# Patient Record
Sex: Male | Born: 1950
Health system: Southern US, Community
[De-identification: ages and names within clinical notes are randomized; demographics above are authoritative.]

## PROBLEM LIST (undated history)

## (undated) DIAGNOSIS — F419 Anxiety disorder, unspecified: Secondary | ICD-10-CM

## (undated) DIAGNOSIS — E119 Type 2 diabetes mellitus without complications: Secondary | ICD-10-CM

## (undated) DIAGNOSIS — R109 Unspecified abdominal pain: Secondary | ICD-10-CM

## (undated) DIAGNOSIS — K219 Gastro-esophageal reflux disease without esophagitis: Secondary | ICD-10-CM

## (undated) DIAGNOSIS — K635 Polyp of colon: Secondary | ICD-10-CM

## (undated) DIAGNOSIS — J189 Pneumonia, unspecified organism: Secondary | ICD-10-CM

## (undated) DIAGNOSIS — E785 Hyperlipidemia, unspecified: Secondary | ICD-10-CM

## (undated) DIAGNOSIS — G8929 Other chronic pain: Secondary | ICD-10-CM

## (undated) DIAGNOSIS — N200 Calculus of kidney: Secondary | ICD-10-CM

## (undated) DIAGNOSIS — F32A Depression, unspecified: Secondary | ICD-10-CM

## (undated) DIAGNOSIS — C73 Malignant neoplasm of thyroid gland: Secondary | ICD-10-CM

## (undated) HISTORY — PX: ELBOW ARTHROSCOPY WITH TENDON RECONSTRUCTION: SHX5616

## (undated) HISTORY — PX: BACK SURGERY: SHX140

## (undated) HISTORY — DX: Type 2 diabetes mellitus without complications: E11.9

## (undated) HISTORY — PX: CHOLECYSTECTOMY: SHX55

## (undated) HISTORY — DX: Malignant neoplasm of thyroid gland: C73

## (undated) HISTORY — DX: Depression, unspecified: F32.A

## (undated) HISTORY — PX: THYROIDECTOMY: SHX17

## (undated) HISTORY — DX: Calculus of kidney: N20.0

## (undated) HISTORY — DX: Polyp of colon: K63.5

## (undated) HISTORY — PX: ANKLE ARTHROSCOPY WITH RECONSTRUCTION: SHX5583

## (undated) HISTORY — DX: Gastro-esophageal reflux disease without esophagitis: K21.9

## (undated) HISTORY — DX: Hyperlipidemia, unspecified: E78.5

## (undated) HISTORY — PX: ULNAR NERVE TRANSPOSITION: SHX2595

## (undated) HISTORY — PX: COLONOSCOPY: SHX174

## (undated) HISTORY — PX: SHOULDER ARTHROSCOPY WITH LABRAL REPAIR: SHX5691

## (undated) HISTORY — DX: Anxiety disorder, unspecified: F41.9

---

## 1998-08-12 ENCOUNTER — Ambulatory Visit (HOSPITAL_BASED_OUTPATIENT_CLINIC_OR_DEPARTMENT_OTHER): Admission: RE | Admit: 1998-08-12 | Discharge: 1998-08-12 | Payer: Self-pay | Admitting: General Surgery

## 2001-02-16 ENCOUNTER — Other Ambulatory Visit: Admission: RE | Admit: 2001-02-16 | Discharge: 2001-02-16 | Payer: Self-pay | Admitting: Gastroenterology

## 2001-02-16 ENCOUNTER — Encounter (INDEPENDENT_AMBULATORY_CARE_PROVIDER_SITE_OTHER): Payer: Self-pay | Admitting: Specialist

## 2001-03-06 ENCOUNTER — Encounter: Payer: Self-pay | Admitting: Family Medicine

## 2001-03-06 ENCOUNTER — Ambulatory Visit (HOSPITAL_COMMUNITY): Admission: RE | Admit: 2001-03-06 | Discharge: 2001-03-06 | Payer: Self-pay | Admitting: Family Medicine

## 2001-06-29 ENCOUNTER — Ambulatory Visit (HOSPITAL_COMMUNITY): Admission: RE | Admit: 2001-06-29 | Discharge: 2001-06-29 | Payer: Self-pay | Admitting: Family Medicine

## 2001-06-29 ENCOUNTER — Encounter: Payer: Self-pay | Admitting: Family Medicine

## 2002-06-13 ENCOUNTER — Encounter: Payer: Self-pay | Admitting: Family Medicine

## 2002-06-13 ENCOUNTER — Ambulatory Visit (HOSPITAL_COMMUNITY): Admission: RE | Admit: 2002-06-13 | Discharge: 2002-06-13 | Payer: Self-pay | Admitting: Family Medicine

## 2002-07-02 ENCOUNTER — Ambulatory Visit (HOSPITAL_COMMUNITY): Admission: RE | Admit: 2002-07-02 | Discharge: 2002-07-02 | Payer: Self-pay | Admitting: Family Medicine

## 2002-07-02 ENCOUNTER — Encounter: Payer: Self-pay | Admitting: Family Medicine

## 2003-01-09 ENCOUNTER — Encounter: Payer: Self-pay | Admitting: Family Medicine

## 2003-01-09 ENCOUNTER — Ambulatory Visit (HOSPITAL_COMMUNITY): Admission: RE | Admit: 2003-01-09 | Discharge: 2003-01-09 | Payer: Self-pay | Admitting: Family Medicine

## 2003-04-03 ENCOUNTER — Ambulatory Visit (HOSPITAL_COMMUNITY): Admission: RE | Admit: 2003-04-03 | Discharge: 2003-04-03 | Payer: Self-pay | Admitting: Gastroenterology

## 2004-01-30 ENCOUNTER — Ambulatory Visit (HOSPITAL_COMMUNITY): Admission: RE | Admit: 2004-01-30 | Discharge: 2004-01-30 | Payer: Self-pay | Admitting: Family Medicine

## 2004-05-14 ENCOUNTER — Ambulatory Visit: Payer: Self-pay | Admitting: Gastroenterology

## 2004-05-28 ENCOUNTER — Ambulatory Visit: Payer: Self-pay | Admitting: Gastroenterology

## 2004-06-23 ENCOUNTER — Ambulatory Visit (HOSPITAL_COMMUNITY): Admission: RE | Admit: 2004-06-23 | Discharge: 2004-06-23 | Payer: Self-pay | Admitting: Family Medicine

## 2004-12-28 ENCOUNTER — Ambulatory Visit (HOSPITAL_COMMUNITY): Admission: RE | Admit: 2004-12-28 | Discharge: 2004-12-28 | Payer: Self-pay | Admitting: Family Medicine

## 2005-03-10 ENCOUNTER — Encounter: Admission: RE | Admit: 2005-03-10 | Discharge: 2005-03-10 | Payer: Self-pay | Admitting: Orthopaedic Surgery

## 2005-08-18 ENCOUNTER — Ambulatory Visit (HOSPITAL_COMMUNITY): Admission: RE | Admit: 2005-08-18 | Discharge: 2005-08-18 | Payer: Self-pay | Admitting: Family Medicine

## 2006-06-10 ENCOUNTER — Ambulatory Visit: Payer: Self-pay | Admitting: Gastroenterology

## 2006-06-10 LAB — CONVERTED CEMR LAB
Bacteria, UA: NEGATIVE
Basophils Absolute: 0.1 10*3/uL (ref 0.0–0.1)
Basophils Relative: 0.6 % (ref 0.0–1.0)
Bilirubin Urine: NEGATIVE
Crystals: NEGATIVE
Eosinophils Absolute: 0.1 10*3/uL (ref 0.0–0.6)
Eosinophils Relative: 1 % (ref 0.0–5.0)
HCT: 45 % (ref 39.0–52.0)
Hemoglobin, Urine: NEGATIVE
Hemoglobin: 16 g/dL (ref 13.0–17.0)
Ketones, ur: NEGATIVE mg/dL
Leukocytes, UA: NEGATIVE
Lymphocytes Relative: 27.2 % (ref 12.0–46.0)
MCHC: 35.6 g/dL (ref 30.0–36.0)
MCV: 94.3 fL (ref 78.0–100.0)
Monocytes Absolute: 0.6 10*3/uL (ref 0.2–0.7)
Monocytes Relative: 6.4 % (ref 3.0–11.0)
Neutro Abs: 5.8 10*3/uL (ref 1.4–7.7)
Neutrophils Relative %: 64.8 % (ref 43.0–77.0)
Nitrite: NEGATIVE
Platelets: 372 10*3/uL (ref 150–400)
RBC: 4.78 M/uL (ref 4.22–5.81)
RDW: 11.8 % (ref 11.5–14.6)
Specific Gravity, Urine: 1.02 (ref 1.000–1.03)
Squamous Epithelial / HPF: NEGATIVE /lpf
Urine Glucose: NEGATIVE mg/dL
Urobilinogen, UA: 0.2 (ref 0.0–1.0)
WBC: 9.1 10*3/uL (ref 4.5–10.5)
pH: 7 (ref 5.0–8.0)

## 2006-10-14 ENCOUNTER — Ambulatory Visit (HOSPITAL_BASED_OUTPATIENT_CLINIC_OR_DEPARTMENT_OTHER): Admission: RE | Admit: 2006-10-14 | Discharge: 2006-10-14 | Payer: Self-pay | Admitting: Urology

## 2006-10-14 ENCOUNTER — Encounter (INDEPENDENT_AMBULATORY_CARE_PROVIDER_SITE_OTHER): Payer: Self-pay | Admitting: Urology

## 2007-05-30 ENCOUNTER — Emergency Department (HOSPITAL_COMMUNITY): Admission: EM | Admit: 2007-05-30 | Discharge: 2007-05-30 | Payer: Self-pay | Admitting: Emergency Medicine

## 2007-09-29 ENCOUNTER — Ambulatory Visit (HOSPITAL_COMMUNITY): Admission: RE | Admit: 2007-09-29 | Discharge: 2007-09-29 | Payer: Self-pay | Admitting: Family Medicine

## 2008-01-15 ENCOUNTER — Ambulatory Visit (HOSPITAL_COMMUNITY): Admission: RE | Admit: 2008-01-15 | Discharge: 2008-01-15 | Payer: Self-pay | Admitting: Family Medicine

## 2009-05-13 ENCOUNTER — Encounter (INDEPENDENT_AMBULATORY_CARE_PROVIDER_SITE_OTHER): Payer: Self-pay | Admitting: *Deleted

## 2009-12-16 ENCOUNTER — Telehealth: Payer: Self-pay | Admitting: Gastroenterology

## 2010-03-21 ENCOUNTER — Encounter: Payer: Self-pay | Admitting: Ophthalmology

## 2010-03-21 ENCOUNTER — Observation Stay (HOSPITAL_COMMUNITY)
Admission: EM | Admit: 2010-03-21 | Discharge: 2010-03-22 | Payer: Self-pay | Source: Home / Self Care | Admitting: Emergency Medicine

## 2010-03-22 ENCOUNTER — Encounter: Payer: Self-pay | Admitting: Internal Medicine

## 2010-03-22 DIAGNOSIS — K219 Gastro-esophageal reflux disease without esophagitis: Secondary | ICD-10-CM | POA: Insufficient documentation

## 2010-03-22 DIAGNOSIS — Z85850 Personal history of malignant neoplasm of thyroid: Secondary | ICD-10-CM | POA: Insufficient documentation

## 2010-03-22 DIAGNOSIS — R55 Syncope and collapse: Secondary | ICD-10-CM | POA: Insufficient documentation

## 2010-03-22 DIAGNOSIS — M5380 Other specified dorsopathies, site unspecified: Secondary | ICD-10-CM | POA: Insufficient documentation

## 2010-03-22 DIAGNOSIS — Z8659 Personal history of other mental and behavioral disorders: Secondary | ICD-10-CM | POA: Insufficient documentation

## 2010-04-21 ENCOUNTER — Ambulatory Visit: Admit: 2010-04-21 | Payer: Self-pay

## 2010-04-29 ENCOUNTER — Emergency Department (HOSPITAL_COMMUNITY)
Admission: EM | Admit: 2010-04-29 | Discharge: 2010-04-29 | Payer: Self-pay | Source: Home / Self Care | Admitting: Emergency Medicine

## 2010-05-04 LAB — COMPREHENSIVE METABOLIC PANEL
ALT: 18 U/L (ref 0–53)
AST: 17 U/L (ref 0–37)
Albumin: 3.9 g/dL (ref 3.5–5.2)
Alkaline Phosphatase: 92 U/L (ref 39–117)
BUN: 10 mg/dL (ref 6–23)
CO2: 21 mEq/L (ref 19–32)
Calcium: 8.4 mg/dL (ref 8.4–10.5)
Chloride: 107 mEq/L (ref 96–112)
Creatinine, Ser: 0.89 mg/dL (ref 0.4–1.5)
GFR calc Af Amer: >60 mL/min (ref >60)
GFR calc non Af Amer: >60 mL/min (ref >60)
Glucose, Bld: 99 mg/dL (ref 70–99)
Potassium: 3.7 mEq/L (ref 3.5–5.1)
Sodium: 134 mEq/L — ABNORMAL LOW (ref 135–145)
Total Bilirubin: 0.7 mg/dL (ref 0.3–1.2)
Total Protein: 6.7 g/dL (ref 6.0–8.3)

## 2010-05-04 LAB — DIFFERENTIAL
Basophils Absolute: 0.1 10*3/uL (ref 0.0–0.1)
Basophils Relative: 1 % (ref 0–1)
Eosinophils Absolute: 0.1 10*3/uL (ref 0.0–0.7)
Eosinophils Relative: 1 % (ref 0–5)
Lymphocytes Relative: 23 % (ref 12–46)
Lymphs Abs: 2.5 10*3/uL (ref 0.7–4.0)
Monocytes Absolute: 0.5 10*3/uL (ref 0.1–1.0)
Monocytes Relative: 5 % (ref 3–12)
Neutro Abs: 7.6 10*3/uL (ref 1.7–7.7)
Neutrophils Relative %: 70 % (ref 43–77)

## 2010-05-04 LAB — LIPASE, BLOOD: Lipase: 44 U/L (ref 11–59)

## 2010-05-04 LAB — CBC
HCT: 43.8 % (ref 39.0–52.0)
Hemoglobin: 15.5 g/dL (ref 13.0–17.0)
MCH: 32.1 pg (ref 26.0–34.0)
MCHC: 35.4 g/dL (ref 30.0–36.0)
MCV: 90.7 fL (ref 78.0–100.0)
Platelets: 318 10*3/uL (ref 150–400)
RBC: 4.83 MIL/uL (ref 4.22–5.81)
RDW: 12.9 % (ref 11.5–15.5)
WBC: 10.8 10*3/uL — ABNORMAL HIGH (ref 4.0–10.5)

## 2010-05-04 LAB — POCT CARDIAC MARKERS
CKMB, poc: <1 ng/mL — ABNORMAL LOW (ref 1.0–8.0)
Myoglobin, poc: 71.9 ng/mL (ref 12–200)
Troponin i, poc: <0.05 ng/mL (ref 0.00–0.09)

## 2010-05-10 ENCOUNTER — Encounter: Payer: Self-pay | Admitting: Gastroenterology

## 2010-05-11 ENCOUNTER — Encounter: Payer: Self-pay | Admitting: Family Medicine

## 2010-05-19 NOTE — Progress Notes (Signed)
Summary: Schedule Colonoscopy   Phone Note Outgoing Call Call back at Home Phone [phone]   Call placed by: Harlow Mares CMA Duncan Dull),  December 16, 2009 8:57 AM Call placed to: Patient Summary of Call: patient due for a colonoscopy, I have Left a message on patients machine to call back.  Initial call taken by: Harlow Mares CMA Duncan Dull),  December 16, 2009 8:57 AM  Follow-up for Phone Call        Left a message on the patient machine to call back and schedule a previsit and procedure with our office. A letter will be mailed to the patient.   Follow-up by: Harlow Mares CMA Duncan Dull),  December 25, 2009 2:32 PM

## 2010-05-19 NOTE — Letter (Signed)
Summary: Colonoscopy Letter  [facility]  [address] [place], [place] [address]   Phone: [phone]  Fax: [phone]      May 13, 2009 MRN: [id number]   [facility] [address] [place], [place]  [address]   Dear Lee Mitchell,   According to your medical record, it is time for you to schedule a Colonoscopy. The American Cancer Society recommends this procedure as a method to detect early colon cancer. Patients with a family history of colon cancer, or a personal history of colon polyps or inflammatory bowel disease are at increased risk.  This letter has beeen generated based on the recommendations made at the time of your procedure. If you feel that in your particular situation this may no longer apply, please contact our office.  Please call our office at [phone] to schedule this appointment or to update your records at your earliest convenience.  Thank you for cooperating with [place] to provide you with the very best care possible.   Sincerely,  Rachael Fee, M.D.  [facility] [phone]

## 2010-05-19 NOTE — Miscellaneous (Signed)
Summary: Hospital Admission  INTERNAL MEDICINE ADMISSION HISTORY AND PHYSICAL  Attending: Dr. Phillips Odor First Contact: Dr. Odis Luster [phone] Second Contact: Dr. Gwenlyn Perking [phone] PCP: None  CC: Syncope HPI: Lee Mitchell is pleasant 60 year old man with pmh significant for thyroid cancer 2004-2005, Depression, and GERD, who presented to the ED on the morning of admission for dizziness and syncope.  Pt sates that around 9am that morning he was walking in downtown [place] when he suddenly felt very lightheaded and dizzy.  Pts symptoms were associated with nausea but he denied any concurrent CP, SOB or radiating pain.  Pt sates that he saw stars and sat down but his condition continued to worsen and the next thing he knew he woke up after having passed out.  This episode was unwitnessed and the pt is unaware of how long he was unconscious. Pt did not lose control of his bowels or bladder but does state that the back of his tongue is sore on the left side.  The patient states that he did have some blurred vision after the event. Pt denies any previous similar episodes in the past.  After the episode the patient experienced a confusional state that he states lasted several hours.  Of note, pt has recently had a cold with congestion and has been eating less than normal but does not feel that he has been drinking less water.  Pt had not eaten on the morning of this event.  In the ED, pt developed back spasms with pain in his right side radiating to the back, but pt states that his has been occuring intermittently several time a week for the last 3 months.    Lee Mitchell denies any recent fever, chills, abdominal pain, change in his BM's, blood in his stool, black stool, or cold/heat intolerance.   ALLERGIES: NKDA   PAST MEDICAL HISTORY: Hypothyroidism Chronic right epididymitis Prostatitis colon tubular adenomas last colonoscopy Feb 2006  MEDICATIONS:  Synthroid 250 micrograms daily with added 75 mg  three times weekly MWF. Mirtazapine 30mg  by mouth QHS Omeprazole 40mg  by mouth daily.    SOCIAL HISTORY: Single Two children and one grandchild Smokes tobacco <1ppd x 33 years. Drinks alcohol a few times weekly (2-3 beers) Denies elicit drug use.   FAMILY HISTORY Mother is healthy and 15 Father died at age 55 of an unknown cause.   ROS: Negative as per HPI.   VITALS: T: 97.4 P:86  BP: 108/75 R: 17 O2SAT: 95% ON: RA Orthostatic Vital signs in ED: Sitting: 118/75 Standing: 116/80  PHYSICAL EXAM: General:  alert, well-developed, and cooperative to examination.   Head:  normocephalic and atraumatic.   Eyes:  vision grossly intact, pupils equal, pupils round, pupils reactive to light, no injection and anicteric.   Mouth:  pharynx pink and moist, no erythema, and no exudates.   Neck:  supple, full ROM, no thyromegaly, no JVD, and no carotid bruits.   Lungs:  normal respiratory effort, no accessory muscle use, normal breath sounds, no crackles, and no wheezes.  Heart:  normal rate, regular rhythm, no murmur, no gallop, and no rub.   Abdomen:  soft, non-tender, normal bowel sounds, no distention, no guarding, no rebound tenderness, no hepatomegaly, and no splenomegaly.   Msk:  no joint swelling, no joint warmth, and no redness over joints.   Pulses:  2+ DP/PT pulses bilaterally Extremities:  No cyanosis, clubbing, edema  Neurologic:  alert & oriented X3, cranial nerves II-XII intact, strength normal in all extremities.  Skin:  turgor normal and no rashes.     LABS:  WBC                                      9.5               4.0-10.5         K/uL  RBC                                      4.58              4.22-5.81        MIL/uL  Hemoglobin (HGB)                         14.9              13.0-17.0        g/dL  Hematocrit (HCT)                         42.1              39.0-52.0        %  MCV                                      91.9              78.0-100.0       fL  MCH -                                     32.5              26.0-34.0        pg  MCHC                                     35.4              30.0-36.0        g/dL  RDW                                      12.1              11.5-15.5        %  Platelet Count (PLT)                     277               150-400          K/uL  Neutrophils, %                           73                43-77            %  Lymphocytes, %  20                12-46            %  Monocytes, %                             6                 3-12             %  Eosinophils, %                           1                 0-5              %  Basophils, %                             0                 0-1              %  Neutrophils, Absolute                    6.9               1.7-7.7          K/uL  Lymphocytes, Absolute                    1.9               0.7-4.0          K/uL  Monocytes, Absolute                      0.6               0.1-1.0          K/uL  Eosinophils, Absolute                    0.1               0.0-0.7          K/uL  Basophils, Absolute                      0.0               0.0-0.1          K/uL  CKMB, POC                                <1.0       l      1.0-8.0          ng/mL  Troponin I, POC                          <0.05             0.00-0.09        ng/mL  Myoglobin, POC                           79.3  12-200           ng/mL   Sodium (NA)                              135               135-145          mEq/L  Potassium (K)                            3.8               3.5-5.1          mEq/L  Chloride                                 99                96-112           mEq/L  CO2                                      21                19-32            mEq/L  Glucose                                  111        h      70-99            mg/dL  BUN                                      13                6-23             mg/dL  Creatinine                               0.94               0.4-1.5          mg/dL  GFR, Est Non African American            >60               >60              mL/min  GFR, Est African American                >60               >60              mL/min    Oversized comment, see footnote  1  Calcium                                  8.6  8.4-10.5         mg/dL  Color, Urine                             YELLOW            YELLOW  Appearance                               CLOUDY     a      CLEAR  Specific Gravity                         1.020             1.005-1.030  pH                                       7.0               5.0-8.0  Urine Glucose                            NEGATIVE          NEG              mg/dL  Bilirubin                                NEGATIVE          NEG  Ketones                                  15         a      NEG              mg/dL  Blood                                    NEGATIVE          NEG  Protein                                  NEGATIVE          NEG              mg/dL  Urobilinogen                             0.2               0.0-1.0          mg/dL  Nitrite                                  NEGATIVE          NEG  Leukocytes  NEGATIVE          NEG    MICROSCOPIC NOT DONE ON URINES WITH NEGATIVE PROTEIN, BLOOD, LEUKOCYTES,    NITRITE, OR GLUCOSE <1000 mg/dL.  IMAGING:  MRI brain without contrast IMPRESSION:    1.  No acute intracranial abnormality.   2.  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  CT brain without contrast  IMPRESSION:   Negative unenhanced CT of the brain.  Mild mucosal thickening in   the right ethmoid air cells and medial right maxillary sinus.  Chest X-ray  IMPRESSION:   No active lung disease.  Mild peribronchial thickening.  EKG NSR no st segment changes. Non specific intraventricular conduction delay.  ASSESSMENT AND PLAN:  This is a 60 year old male with a hx of thyroid CA, depression and GERD who presents for evaluation of an acute  syncopal event.   Syncope: The broad DDX would include orthostatic hypotension, arrhythmia, seizure, TIA, ACS, or stroke.  At this point, stroke and ACS are unlikely given normal MRI and normal POC cardiac enzymes.  Seizure is a possability, however, a prolactin level will not be helpful at this time.  Given his age and mildly abnormal EKG, arrhythmia is a possibility and though cardiac exam was WNL, a structural lesion could also result in syncope.  Given that the pt is on synthroid, over replacement could be a possible precipitating factor for arrhythmia.  Given that the pt admits to decreased by mouth intake over the last few weeks and no food intake on the morning of the event, dehydration leading to orthostatic hypotension is a very likely mechanism for this event especially given that pts SBP increased from the low 100's to around 120 with administration of 1L NS in the ED.   Plan:       - Admit to telemetry     - Check TSH     - Check AM EKG given no comparison     - Check AM labs (CBC, BMET)     -Check 2D echo.     -Check cardiac enzymes x 3.   Back Spasms:  Will treat with flexeril for now.  Likely musculoskeletal in orgin.   Gerd: Continue protonix 40mg  daily.   Thyroid CA: Stable, will check TSH.   DEPRESSION/ANXIETY: Continue home medications  VTE PROPH: lovenox  Attending Physician: I have seen and examined the patient. I reviewed the resident/fellow note and agree with the findings and plan of care as documented. My additions and revisions are included.   Name:  Date:  Clinical Lists Changes

## 2010-05-19 NOTE — Discharge Summary (Signed)
Summary: Hospital Discharge Update    Hospital Discharge Update:  Date of Admission: 03/21/2010 Date of Discharge: 03/22/2010  Brief Summary:  60yo M with hx of thyroid cancer s/p surgery, hypothyroidism, depression, and GERD who was admitted after an unwitnessed episode of syncope. Had some prodromal symptoms of dizziness and nausea prior to passing out; he was sitting down at the time and does not know how long he lost consciousness. He was admitted for syncope w/u. CT of head was negative; MRI of head demonstrated no acute findings but did show some remote lacunar infarcts of L basal ganglia. ECG and CXR were largely unremarkable. Cardiac enzymes negative. No events on telemetry during admission. D-dimer negative. Echocardiogram pending at discharge. TSH was suppressed at 0.015; although we expected the TSH to be suppressed secondary to the high dose synthroid he takes at home because of thyroid cancer history, it was felt that hyperthyroid state could have contributed to an arrthymia that caused his syncopal episode. Furthermore, he has not had a physician check his thyroid levels in some time and it seems that the degree of suppression is most likely beyond the goal of his previous physicians. The risks vs. benefits of lowering synthroid dose discussed with patient and ultimately it was decided to decrease synthroid to daily at least temporarily. When he is followed in the clinic in  ~3 weeks, thyroid hormone levels should be checked and synthroid dose should be evaluated. He was also started on an 81mg  ASA b/c of possible remote lacunar infarcts and ?past symptoms of TIA.   Lab or other results pending at discharge:  2D Echocardiogram  Other labs needed at follow-up: TSH, free T4  Other follow-up issues:  1) May need adjustment of Synthroid dose. Was previously placed on high dose b/c of thyroid cancer history; however, TSH was profoundly suppressed and it was felt that syncopal  episode could have been driven by hyperthyroid state/arrythmia. From our understanding of his cancer history, a TSH of 0.1 or so may be a more appropriate goal and may put him at lower risk for adverse events. However, you may want to get records from [facility], where his thyroid surgery was done in 2005, to clarify this issue.  2) He has had back pain/spasm for the past few months that was obviously causing discomfort in the hospital. Wrote for some Flexeril and Vicodin at discharge; however, management of this pain should be further addressed at follow-up.  Problem list changes:  Added new problem of GERD (ICD-530.81) Added new problem of SYNCOPE (ICD-780.2) Added new problem of THYROID CANCER, HX OF (ICD-V10.87) Added new problem of DEPRESSION, HX OF (ICD-V11.8) Added new problem of MUSCLE SPASM, BACK (ICD-724.8)  Medication list changes:  Added new medication of ASPIRIN 81 MG CHEW (ASPIRIN) Take 1 tablet by mouth once a day Added new medication of MIRTAZAPINE 30 MG TABS (MIRTAZAPINE) Take 1 tablet by mouth once a day at bedtime Added new medication of OMEPRAZOLE 40 MG CPDR (OMEPRAZOLE) Take 1 tablet by mouth once a day Added new medication of SYNTHROID 125 MCG TABS (LEVOTHYROXINE SODIUM) Take 1 tablet by mouth once a day Added new medication of IBUPROFEN 200 MG TABS (IBUPROFEN) Take 2-4 tablets every 8 hours as needed for pain Added new medication of FLEXERIL 10 MG TABS (CYCLOBENZAPRINE HCL) Take one tablet by mouth at bedtime as needed for muscle spasm - Signed Added new medication of HYDROCODONE-ACETAMINOPHEN 5-500 MG TABS (HYDROCODONE-ACETAMINOPHEN) Take 1 tablet every 6 hours as needed for pain -  Signed Rx of FLEXERIL 10 MG TABS (CYCLOBENZAPRINE HCL) Take one tablet by mouth at bedtime as needed for muscle spasm;  #30 x 0;  Signed;  Entered by: Whitney Post MD;  Authorized by: Whitney Post MD;  Method used: Print then Give to Patient Rx of HYDROCODONE-ACETAMINOPHEN 5-500 MG TABS  (HYDROCODONE-ACETAMINOPHEN) Take 1 tablet every 6 hours as needed for pain;  #30 x 0;  Signed;  Entered by: Whitney Post MD;  Authorized by: Whitney Post MD;  Method used: Print then Give to Patient  The medication, problem, and allergy lists have been updated.  Please see the dictated discharge summary for details.  Discharge medications:  ASPIRIN 81 MG CHEW (ASPIRIN) Take 1 tablet by mouth once a day MIRTAZAPINE 30 MG TABS (MIRTAZAPINE) Take 1 tablet by mouth once a day at bedtime OMEPRAZOLE 40 MG CPDR (OMEPRAZOLE) Take 1 tablet by mouth once a day SYNTHROID 125 MCG TABS (LEVOTHYROXINE SODIUM) Take 1 tablet by mouth once a day IBUPROFEN 200 MG TABS (IBUPROFEN) Take 2-4 tablets every 8 hours as needed for pain FLEXERIL 10 MG TABS (CYCLOBENZAPRINE HCL) Take one tablet by mouth at bedtime as needed for muscle spasm HYDROCODONE-ACETAMINOPHEN 5-500 MG TABS (HYDROCODONE-ACETAMINOPHEN) Take 1 tablet every 6 hours as needed for pain  Other patient instructions:  We will schedule a follow-up appointment for you in 2-3 weeks at the [facility] clinic. If you are not contacted with an appointment time, please call [phone].  Please decrease you Synthroid dose to 58mcg/day. We will re-check your thyroid hormone levels at your follow-up appointment.  If you develop palpitations, chest pain, shortness of breath, dizziness, lightheadedness, or other concerning symptoms, call our clinic [phone]) or go to the Emergency room.   Note: Hospital Discharge Medications & Other Instructions handout was printed, one copy for patient and a second copy to be placed in hospital chart.

## 2010-06-30 LAB — BASIC METABOLIC PANEL
BUN: 13 mg/dL (ref 6–23)
BUN: 15 mg/dL (ref 6–23)
CO2: 21 mEq/L (ref 19–32)
CO2: 24 mEq/L (ref 19–32)
Calcium: 8.6 mg/dL (ref 8.4–10.5)
Chloride: 105 mEq/L (ref 96–112)
Chloride: 99 mEq/L (ref 96–112)
Creatinine, Ser: 0.94 mg/dL (ref 0.4–1.5)
GFR calc Af Amer: >60 mL/min (ref >60)
Glucose, Bld: 111 mg/dL — ABNORMAL HIGH (ref 70–99)
Glucose, Bld: 118 mg/dL — ABNORMAL HIGH (ref 70–99)
Potassium: 3.7 mEq/L (ref 3.5–5.1)
Sodium: 134 mEq/L — ABNORMAL LOW (ref 135–145)

## 2010-06-30 LAB — CBC
HCT: 36.7 % — ABNORMAL LOW (ref 39.0–52.0)
HCT: 42.1 % (ref 39.0–52.0)
Hemoglobin: 12.6 g/dL — ABNORMAL LOW (ref 13.0–17.0)
Hemoglobin: 14.9 g/dL (ref 13.0–17.0)
MCH: 31.8 pg (ref 26.0–34.0)
MCH: 32.5 pg (ref 26.0–34.0)
MCHC: 34.3 g/dL (ref 30.0–36.0)
MCHC: 35.4 g/dL (ref 30.0–36.0)
MCV: 91.9 fL (ref 78.0–100.0)
MCV: 92.7 fL (ref 78.0–100.0)
Platelets: 277 10*3/uL (ref 150–400)
RBC: 4.58 MIL/uL (ref 4.22–5.81)
RDW: 12.1 % (ref 11.5–15.5)
WBC: 9.5 K/uL (ref 4.0–10.5)

## 2010-06-30 LAB — DIFFERENTIAL
Basophils Absolute: 0 K/uL (ref 0.0–0.1)
Basophils Relative: 0 % (ref 0–1)
Eosinophils Absolute: 0.1 10*3/uL (ref 0.0–0.7)
Eosinophils Relative: 1 % (ref 0–5)
Lymphocytes Relative: 20 % (ref 12–46)
Lymphs Abs: 1.9 10*3/uL (ref 0.7–4.0)
Monocytes Absolute: 0.6 K/uL (ref 0.1–1.0)
Monocytes Relative: 6 % (ref 3–12)
Neutro Abs: 6.9 K/uL (ref 1.7–7.7)
Neutrophils Relative %: 73 % (ref 43–77)

## 2010-06-30 LAB — CK TOTAL AND CKMB (NOT AT ARMC)
CK, MB: 1.2 ng/mL (ref 0.3–4.0)
Relative Index: INVALID (ref 0.0–2.5)

## 2010-06-30 LAB — URINALYSIS, ROUTINE W REFLEX MICROSCOPIC
Bilirubin Urine: NEGATIVE
Glucose, UA: NEGATIVE mg/dL
Hgb urine dipstick: NEGATIVE
Ketones, ur: 15 mg/dL — AB
Nitrite: NEGATIVE
Protein, ur: NEGATIVE mg/dL
Specific Gravity, Urine: 1.02 (ref 1.005–1.030)
Urobilinogen, UA: 0.2 mg/dL (ref 0.0–1.0)
pH: 7 (ref 5.0–8.0)

## 2010-06-30 LAB — BASIC METABOLIC PANEL WITH GFR
GFR calc non Af Amer: >60 mL/min (ref >60)
Potassium: 3.8 meq/L (ref 3.5–5.1)
Sodium: 135 meq/L (ref 135–145)

## 2010-06-30 LAB — TROPONIN I: Troponin I: 0.01 ng/mL (ref 0.00–0.06)

## 2010-06-30 LAB — POCT CARDIAC MARKERS

## 2010-06-30 LAB — TSH: TSH: 0.015 u[IU]/mL — ABNORMAL LOW (ref 0.350–4.500)

## 2010-09-01 NOTE — Op Note (Signed)
NAME:  Lee Mitchell, Lee Mitchell            ACCOUNT NO.:  [id number]   MEDICAL RECORD NO.:  [id number]          PATIENT TYPE:  AMB   LOCATION:  [facility]                         FACILITY:  [facility]   PHYSICIAN:  Sigmund I. Patsi Sears, M.D.DATE OF BIRTH:  1951-02-26   DATE OF PROCEDURE:  10/14/2006  DATE OF DISCHARGE:                               OPERATIVE REPORT   PREOPERATIVE DIAGNOSIS:  Chronic right epididymitis.   POSTOPERATIVE DIAGNOSIS:  Chronic right epididymitis.   OPERATIONS:  1. Right lower inguinal exploration.  2. Right epididymectomy.   SURGEON:  Sigmund I. Patsi Sears, M.D.   ANESTHESIA:  General LMA.   PREPARATION:  After appropriate preanesthesia, the patient is brought to  the operating room and placed on the operating table in the dorsal  supine position, where general LMA anesthesia was introduced.  He  remained in a supine position, where the penis and inguinal area were  shaven, prepped with Betadine solution, draped in the usual fashion.   The patient's history is as follows:  This 60 year old male has chronic  right testicular pain, which has caused a problem with lifestyle.  The  patient is no longer able to walk well because of severe pain.  He is  limited in his ability to work or play.  He appears to have a right  chronically swollen epididymis not responsive to antibiotic and very  tender to palpation.  He has had two injections of Marcaine and Kenalog,  which have only temporarily reduced his discomfort.  He is in danger of  becoming dependent on pain medications.  He is now for inguinal  exploration and possible orchiectomy or epididymectomy.   PROCEDURE:  A 4 cm right lower inguinal incision was made, subcutaneous  tissue dissected with the electrosurgical unit.  The testicle was  delivered into the groin and the tunica vaginalis incised.  The  epididymis appeared to be quite swollen, with large amount of granular  edema of the head of the epididymis.  The  previously-noted vas was  identified, post vasectomy.  The vas was dissected down to level of the  epididymis.  The epididymis was then dissected off the testicle with  care taken to avoid injury to the vascular supply of the testicle.  Following complete epididymectomy, the tissue was sent to the laboratory  for examination.  The testicle was then placed within the wound.  No  bleeding was noted.  The wound was closed in two layers with 3-0 Vicryl  suture and 4-0  Monocryl running suture.  A sterile dressing was applied and the patient  underwent injection of the cord for a cord block.  The wound was also  injected with 0.25% plain Marcaine.  The patient was awakened and taken  to the recovery room.  The B&O suppository was given.  He was given IV  Toradol.      Sigmund I. Patsi Sears, M.D.  Electronically Signed     SIT/MEDQ  D:  10/14/2006  T:  10/15/2006  Job:  [id number]

## 2010-09-04 NOTE — Assessment & Plan Note (Signed)
[facility]                                 ON-CALL NOTE   NAME:LINEBERRYCody, Albus                     MRN:          [id number]  DATE:06/18/2006                            DOB:          1950-05-15    Telephone number [phone].   Physician:  Dr. Rob Bunting and Dr. Victorino Dike.   Mr. Meunier calls today complaining of rectal and perineal pain.  He  states he saw Dr. Christella Hartigan about a week ago, in Dr. Victorino Dike absence,  and was diagnosed with prostatitis, and he has been treated with Cipro.  He has had persistent problems with low-grade fevers and pain.  He was  initially prescribed Tylox, but this has lead to nausea and other side  effects, and he would like to change pain medications.  I have called  him in a prescription for Vicodin 5/500 one p.o. q. 6 hours p.r.n. pain,  # 20 with no refills to [facility].  I have advised him to  discontinue Tylox.  I advised him to contact his urologist, Dr. Durene Fruits, or his coverage this weekend if his symptoms are not  adequately controlled.  I have also advised him to follow up with Dr.  Durene Fruits for further management of this problem, as it is  generally out of our area of treatment.  He understands this and will  proceed with our recommendations.     Venita Lick. Russella Dar, MD, [facility]  Electronically Signed    MTS/MedQ  DD: 06/18/2006  DT: 06/18/2006  Job #: [id number]   cc:   Rachael Fee, MD

## 2010-09-04 NOTE — Assessment & Plan Note (Signed)
[place]                         GASTROENTEROLOGY OFFICE NOTE   NAME:LINEBERRYHicks, Feick                   MRN:          [id number]  DATE:06/10/2006                            DOB:          03/17/1951    PRIMARY CARE PHYSICIAN:  Dr. Regino Schultze in [place].   GI PROBLEM LIST:  1. History of colon tubular adenomas, last colonoscopy February 2006.      Next colonoscopy February 2011.  2. History of hemangiomas of liver.  This was being followed by Dr.      Victorino Dike, last imaging December 2004.   INTERVAL HISTORY:  This is the first time I am meeting Mr. Howell.  He was previously cared for by Dr. Victorino Dike.  He comes in now  complaining most predominantly of perineal pain.  He was told that some  of this may be from some hemorrhoids seen on exam several weeks ago by  his primary care physician.  He was eventually sent to a surgeon.  They  did not appreciate hemorrhoids on that exam.  Since then his discomfort  has gotten worse.  It does not actually seem to be anal pain but is  rather pain in his perineum.  He has trouble sitting.  He also complains  of some scybalous type stools, going 2 or 3 times a morning.  He had  what he describes as an upper respiratory infection last week and low-  grade fevers.  He was on a Z-Pak for that.   PHYSICAL EXAMINATION:  Weight 174 pounds which is down 5 pounds since  his last visit 3 years ago.  Blood pressure 118/72, pulse 72.  CONSTITUTIONAL:  A generally well appearing.  ABDOMEN:  Soft, nontender, nondistended.  Normal bowel sounds.  RECTAL EXAM:  2 small external hemorrhoids, no obvious fissures.  Prostate seemed very tender on examination.   CURRENT MEDICATIONS:  Aspirin, ibuprofen periodically and Synthroid.   ASSESSMENT AND PLAN:  A 60 year old man with perineal pain, tender  prostate, seems likely to have acute prostatitis.   I am starting him on Ciprofloxacin 500 mg twice daily.  He will get  a  urinalysis as well as a CBC and a comprehensive metabolic profile today.  He will follow up with his primary care physician in one week and come  back here to see me in 2 weeks' time.  I think his main symptoms are  from acute prostatitis.  He does have some minor bowel disturbances.  He  had a colonoscopy in 2006 and so I do not think this will be anything  serious.  I have actually recommended fiber supplements on a daily  basis.  One thing I did not address with him is his history of liver  lesions.  These were called hemangiomas in the past.  When he returns to  see me, I will look into that further to see if he needs any re-imaging.  I also gave him a prescription for 25 Tylox pills.  He  does seem very uncomfortable with his perineal pain.  He is having  trouble sitting and this should help take  the edge off until hopefully  the antibiotics take effect.     Rachael Fee, MD  Electronically Signed    DPJ/MedQ  DD: 06/10/2006  DT: 06/10/2006  Job #: [id number]   cc:   Kirk Ruths, M.D.

## 2010-09-16 ENCOUNTER — Telehealth: Payer: Self-pay | Admitting: Gastroenterology

## 2010-09-16 NOTE — Telephone Encounter (Signed)
Pt aware.

## 2010-09-16 NOTE — Telephone Encounter (Signed)
I do not know any GI docs in [place].

## 2010-09-16 NOTE — Telephone Encounter (Signed)
Dr Jacobs please advise  

## 2011-01-08 LAB — BASIC METABOLIC PANEL
BUN: 18
Calcium: 8.7
GFR calc non Af Amer: >60
Glucose, Bld: 104 — ABNORMAL HIGH
Sodium: 136

## 2011-01-08 LAB — URINALYSIS, ROUTINE W REFLEX MICROSCOPIC
Bilirubin Urine: NEGATIVE
Glucose, UA: NEGATIVE
Hgb urine dipstick: NEGATIVE
Ketones, ur: NEGATIVE
pH: 6.5

## 2011-01-08 LAB — URINE MICROSCOPIC-ADD ON

## 2011-01-08 LAB — DIFFERENTIAL
Basophils Absolute: 0.1
Lymphocytes Relative: 29
Neutro Abs: 5.4

## 2011-01-08 LAB — CBC
Hemoglobin: 15
Platelets: 298
RDW: 12.8

## 2011-02-03 LAB — POCT HEMOGLOBIN-HEMACUE: Operator id: 118191

## 2012-08-24 DIAGNOSIS — K2270 Barrett's esophagus without dysplasia: Secondary | ICD-10-CM | POA: Insufficient documentation

## 2012-10-18 DIAGNOSIS — Z9889 Other specified postprocedural states: Secondary | ICD-10-CM | POA: Insufficient documentation

## 2012-10-18 DIAGNOSIS — M25519 Pain in unspecified shoulder: Secondary | ICD-10-CM | POA: Insufficient documentation

## 2012-10-18 DIAGNOSIS — R0683 Snoring: Secondary | ICD-10-CM | POA: Insufficient documentation

## 2012-10-18 DIAGNOSIS — M1990 Unspecified osteoarthritis, unspecified site: Secondary | ICD-10-CM | POA: Insufficient documentation

## 2013-02-19 DIAGNOSIS — C73 Malignant neoplasm of thyroid gland: Secondary | ICD-10-CM | POA: Insufficient documentation

## 2013-09-25 DIAGNOSIS — F988 Other specified behavioral and emotional disorders with onset usually occurring in childhood and adolescence: Secondary | ICD-10-CM | POA: Insufficient documentation

## 2014-09-25 ENCOUNTER — Emergency Department (HOSPITAL_COMMUNITY)
Admission: EM | Admit: 2014-09-25 | Discharge: 2014-09-26 | Disposition: A | Discharge status: Home / Self Care | Payer: BLUE CROSS/BLUE SHIELD | Attending: Emergency Medicine | Admitting: Emergency Medicine

## 2014-09-25 ENCOUNTER — Encounter (HOSPITAL_COMMUNITY): Payer: Self-pay | Admitting: *Deleted

## 2014-09-25 DIAGNOSIS — Z79899 Other long term (current) drug therapy: Secondary | ICD-10-CM | POA: Diagnosis not present

## 2014-09-25 DIAGNOSIS — Z720 Tobacco use: Secondary | ICD-10-CM | POA: Diagnosis not present

## 2014-09-25 DIAGNOSIS — R1013 Epigastric pain: Secondary | ICD-10-CM | POA: Insufficient documentation

## 2014-09-25 DIAGNOSIS — R109 Unspecified abdominal pain: Secondary | ICD-10-CM

## 2014-09-25 DIAGNOSIS — R1011 Right upper quadrant pain: Secondary | ICD-10-CM | POA: Insufficient documentation

## 2014-09-25 LAB — COMPREHENSIVE METABOLIC PANEL
ALK PHOS: 94 U/L (ref 38–126)
ALT: 16 U/L — AB (ref 17–63)
AST: 19 U/L (ref 15–41)
Albumin: 3.8 g/dL (ref 3.5–5.0)
Anion gap: 10 (ref 5–15)
BILIRUBIN TOTAL: 0.3 mg/dL (ref 0.3–1.2)
BUN: 13 mg/dL (ref 6–20)
CO2: 23 mmol/L (ref 22–32)
Calcium: 8.8 mg/dL — ABNORMAL LOW (ref 8.9–10.3)
Chloride: 104 mmol/L (ref 101–111)
Creatinine, Ser: 0.97 mg/dL (ref 0.61–1.24)
GFR calc Af Amer: >60 mL/min (ref >60)
GLUCOSE: 139 mg/dL — AB (ref 65–99)
POTASSIUM: 3.5 mmol/L (ref 3.5–5.1)
Sodium: 137 mmol/L (ref 135–145)
Total Protein: 6.6 g/dL (ref 6.5–8.1)

## 2014-09-25 LAB — URINALYSIS, ROUTINE W REFLEX MICROSCOPIC
BILIRUBIN URINE: NEGATIVE
GLUCOSE, UA: NEGATIVE mg/dL
Hgb urine dipstick: NEGATIVE
KETONES UR: NEGATIVE mg/dL
LEUKOCYTES UA: NEGATIVE
NITRITE: NEGATIVE
PROTEIN: NEGATIVE mg/dL
Specific Gravity, Urine: 1.027 (ref 1.005–1.030)
UROBILINOGEN UA: 1 mg/dL (ref 0.0–1.0)
pH: 6 (ref 5.0–8.0)

## 2014-09-25 LAB — CBC WITH DIFFERENTIAL/PLATELET
BASOS ABS: 0 10*3/uL (ref 0.0–0.1)
Basophils Relative: 0 % (ref 0–1)
EOS ABS: 0.2 10*3/uL (ref 0.0–0.7)
Eosinophils Relative: 2 % (ref 0–5)
HEMATOCRIT: 40.8 % (ref 39.0–52.0)
Hemoglobin: 14.4 g/dL (ref 13.0–17.0)
LYMPHS PCT: 33 % (ref 12–46)
Lymphs Abs: 3.3 10*3/uL (ref 0.7–4.0)
MCH: 30.6 pg (ref 26.0–34.0)
MCHC: 35.3 g/dL (ref 30.0–36.0)
MCV: 86.6 fL (ref 78.0–100.0)
MONOS PCT: 5 % (ref 3–12)
Monocytes Absolute: 0.5 10*3/uL (ref 0.1–1.0)
NEUTROS PCT: 60 % (ref 43–77)
Neutro Abs: 6.2 10*3/uL (ref 1.7–7.7)
PLATELETS: 319 10*3/uL (ref 150–400)
RBC: 4.71 MIL/uL (ref 4.22–5.81)
RDW: 13.1 % (ref 11.5–15.5)
WBC: 10.2 10*3/uL (ref 4.0–10.5)

## 2014-09-25 LAB — LIPASE, BLOOD: Lipase: 16 U/L — ABNORMAL LOW (ref 22–51)

## 2014-09-25 NOTE — ED Notes (Signed)
The pt is c/o pain in his epigastric area alkkl day.  He has had thuis numersu times in the opast.  He thiunks he has gallstones.

## 2014-09-25 NOTE — ED Provider Notes (Signed)
CSN: [id number]     Arrival date & time 09/25/14  1934 History   First MD Initiated Contact with Patient 09/25/14 2256     Chief Complaint  Patient presents with  . Abdominal Pain    (Consider location/radiation/quality/duration/timing/severity/associated sxs/prior Treatment) HPI Comments: Patient is a 64 year old male who presents to the emergency department for further evaluation of epigastric pain. Patient states that he has had sporadic pain over many years, but his pain has become more frequent over the last few days. Patient states the pain comes on dull initially and progresses to a sharp, "gripping" pain which lasts for up to one hour before resolving. Patient states the pain radiates from his epigastric region to beneath his right shoulder blade. He denies any modifying factors of his symptoms as well as any timing with eating. No associated fever, chest pain, nausea, vomiting, melanoma or hematochezia, or urinary symptoms. He reports that his primary doctor wanted him to have an ultrasound completed to evaluate his gallbladder, but he never followed through with this exam. No medications taken prior to arrival.  Patient is a 64 y.o. male presenting with abdominal pain. The history is provided by the patient. No language interpreter was used.  Abdominal Pain   History reviewed. No pertinent past medical history. History reviewed. No pertinent past surgical history. No family history on file. History  Substance Use Topics  . Smoking status: Current Every Day Smoker  . Smokeless tobacco: Not on file  . Alcohol Use: Yes    Review of Systems  Gastrointestinal: Positive for abdominal pain.  All other systems reviewed and are negative.   Allergies  Morphine and related  Home Medications   Prior to Admission medications   Medication Sig Start Date End Date Taking? Authorizing Provider  amphetamine-dextroamphetamine (ADDERALL) 20 MG tablet Take 20 mg by mouth daily as needed.    Yes Historical Provider, MD  levothyroxine (SYNTHROID, LEVOTHROID) 200 MCG tablet Take 200 mcg by mouth daily before breakfast. Takes with 25  mcg to equal 225 mcg   Yes Historical Provider, MD  levothyroxine (SYNTHROID, LEVOTHROID) 25 MCG tablet Take 25 mcg by mouth daily before breakfast. Takes with 200 mcg to equal 225 mcg   Yes Historical Provider, MD  mirtazapine (REMERON) 45 MG tablet Take 45 mg by mouth at bedtime.   Yes Historical Provider, MD  omeprazole (PRILOSEC) 40 MG capsule Take 40 mg by mouth 2 (two) times daily.   Yes Historical Provider, MD  traMADol (ULTRAM) 50 MG tablet Take 50 mg by mouth every 8 (eight) hours as needed.   Yes Historical Provider, MD   BP 113/78 mmHg  Pulse 87  Temp(Src) 98.6 F (37 C)  Resp 16  Ht 6\' 2"  (1.88 m)  Wt 175 lb (79.379 kg)  BMI 22.46 kg/m2  SpO2 100%   Physical Exam  Constitutional: He is oriented to person, place, and time. He appears well-developed and well-nourished. No distress.  Nontoxic/nonseptic appearing  HENT:  Head: Normocephalic and atraumatic.  Eyes: Conjunctivae and EOM are normal. No scleral icterus.  Neck: Normal range of motion.  Cardiovascular: Normal rate, regular rhythm and intact distal pulses.   Pulmonary/Chest: Effort normal. No respiratory distress. He has no wheezes. He has no rales.  Respirations even and unlabored  Abdominal: Soft. He exhibits no distension. There is tenderness. There is no rebound.  TTP in the epigastric abdomen and RUQ; positive Murphy's sign. No peritoneal signs or masses. Abdomen soft.  Musculoskeletal: Normal range of motion.  Neurological: He is alert and oriented to person, place, and time. He exhibits normal muscle tone. Coordination normal.  Skin: Skin is warm and dry. No rash noted. He is not diaphoretic. No erythema. No pallor.  Psychiatric: He has a normal mood and affect. His behavior is normal.  Nursing note and vitals reviewed.   ED Course  Procedures (including critical  care time) Labs Review Labs Reviewed  COMPREHENSIVE METABOLIC PANEL - Abnormal; Notable for the following:    Glucose, Bld 139 (*)    Calcium 8.8 (*)    ALT 16 (*)    All other components within normal limits  LIPASE, BLOOD - Abnormal; Notable for the following:    Lipase 16 (*)    All other components within normal limits  CBC WITH DIFFERENTIAL/PLATELET  URINALYSIS, ROUTINE W REFLEX MICROSCOPIC (NOT AT [facility])    Imaging Review US Abdomen Complete  09/26/2014   CLINICAL DATA:  Abdominal pain.  EXAM: ULTRASOUND ABDOMEN COMPLETE  COMPARISON:  Rib  FINDINGS: Gallbladder: No gallstones or wall thickening visualized. No sonographic Murphy sign noted.  Common bile duct: Diameter: 5.5 mm, within normal limits.  Liver: Stable echogenic liver lesions consistent with cavernous hemangiomata. Largest is seen in the posterior RIGHT lobe maximum diameter 2.7 cm. Similar findings in the LEFT lobe are also stable. Within normal limits in parenchymal echogenicity.  IVC: No abnormality visualized.  Pancreas: Limited visualization.  Spleen: Size and appearance within normal limits.  Right Kidney: Length: 9.7 cm. Mild cortical thinning. Echogenicity within normal limits. No mass or hydronephrosis visualized.  Left Kidney: Length: 9.8 cm. Mild cortical thinning. Echogenicity within normal limits. No mass or hydronephrosis visualized.  Abdominal aorta: No aneurysm visualized.  Other findings: None.  IMPRESSION: No acute findings. Stable cavernous hemangiomas of the liver. No evidence for gallstones or gallbladder wall thickening. No biliary ductal dilatation.   Electronically Signed   By: Rolla Flatten M.D.   On: 09/26/2014 01:55     EKG Interpretation None      MDM   Final diagnoses:  Abdominal pain    65 year old nontoxic-appearing male presents to the emergency department for further evaluation of abdominal pain. Patient reports similar abdominal pain over the past few years. He presents today given  increased frequency of his pain. Patient is afebrile and hemodynamically stable. His laboratory workup is noncontributory. Ultrasound shows no evidence of acute process in the abdomen. He has no distension, emesis, or constipation to suggest SBO or pSBO.  Patient sleeping comfortably on reexamination. He has no additional complaints of pain at this time. Results reviewed with the patient who verbalizes understanding. I have recommended that he follow-up with his primary care doctor should his pain persist. Patient may require a HIDA scan to be completed as an outpatient. I have offered to provide the patient pain medication on discharge for outpatient pain control, which he declines. Return precautions discussed and provided. Patient agreeable to plan with no unaddressed concerns. Patient discharged in good condition.   Filed Vitals:   09/25/14 1950 09/25/14 2315 09/26/14 0229  BP: 130/73 114/80 113/78  Pulse: 92 80 87  Temp: 98.6 F (37 C)    Resp: 18  16  Height: 6\' 2"  (1.88 m)    Weight: 175 lb (79.379 kg)    SpO2: 97% 98% 100%     Antonietta Breach, PA-C 09/26/14 [place], MD 09/26/14 [phone]

## 2014-09-25 NOTE — ED Notes (Signed)
The pt has had this same epigastric pain for 10 years.  Today he has had many episodes of the pain and he became frightened   No pain at present  He was supposed to have a [place] but has never followed through with it

## 2014-09-26 ENCOUNTER — Emergency Department (HOSPITAL_COMMUNITY): Payer: BLUE CROSS/BLUE SHIELD

## 2014-09-26 MED ORDER — FENTANYL CITRATE (PF) 100 MCG/2ML IJ SOLN
50.0000 ug | Freq: Once | INTRAMUSCULAR | Status: AC
  Administered 2014-09-26: 50 ug via INTRAVENOUS
  Filled 2014-09-26: qty 2

## 2014-09-26 MED ORDER — METOCLOPRAMIDE HCL 5 MG/ML IJ SOLN
10.0000 mg | INTRAMUSCULAR | Status: AC
  Administered 2014-09-26: 10 mg via INTRAVENOUS
  Filled 2014-09-26: qty 2

## 2014-09-26 NOTE — ED Notes (Signed)
Patient transported to Ultrasound 

## 2014-09-26 NOTE — Discharge Instructions (Signed)
Your laboratory workup and ultrasound were normal today. You may require a HIDA scan to further evaluate the source of your discomfort. Recommend that you follow-up with your primary doctor who can order this test, if necessary. Take Tylenol or ibuprofen for pain. Return to the emergency department as needed if symptoms worsen.  Abdominal Pain Many things can cause abdominal pain. Usually, abdominal pain is not caused by a disease and will improve without treatment. It can often be observed and treated at home. Your health care provider will do a physical exam and possibly order blood tests and X-rays to help determine the seriousness of your pain. However, in many cases, more time must pass before a clear cause of the pain can be found. Before that point, your health care provider may not know if you need more testing or further treatment. HOME CARE INSTRUCTIONS  Monitor your abdominal pain for any changes. The following actions may help to alleviate any discomfort you are experiencing:  Only take over-the-counter or prescription medicines as directed by your health care provider.  Do not take laxatives unless directed to do so by your health care provider.  Try a clear liquid diet (broth, tea, or water) as directed by your health care provider. Slowly move to a bland diet as tolerated. SEEK MEDICAL CARE IF:  You have unexplained abdominal pain.  You have abdominal pain associated with nausea or diarrhea.  You have pain when you urinate or have a bowel movement.  You experience abdominal pain that wakes you in the night.  You have abdominal pain that is worsened or improved by eating food.  You have abdominal pain that is worsened with eating fatty foods.  You have a fever. SEEK IMMEDIATE MEDICAL CARE IF:   Your pain does not go away within 2 hours.  You keep throwing up (vomiting).  Your pain is felt only in portions of the abdomen, such as the right side or the left lower portion  of the abdomen.  You pass bloody or black tarry stools. MAKE SURE YOU:  Understand these instructions.   Will watch your condition.   Will get help right away if you are not doing well or get worse.  Document Released: 01/13/2005 Document Revised: 04/10/2013 Document Reviewed: 12/13/2012 [facility] Patient Information 2015 [place], [place]. This information is not intended to replace advice given to you by your health care provider. Make sure you discuss any questions you have with your health care provider.

## 2014-09-26 NOTE — ED Notes (Signed)
Pt. Left with all belongings and refused wheelchair 

## 2014-10-03 ENCOUNTER — Emergency Department (HOSPITAL_COMMUNITY)
Admission: EM | Admit: 2014-10-03 | Discharge: 2014-10-04 | Disposition: A | Discharge status: Home / Self Care | Payer: BLUE CROSS/BLUE SHIELD | Attending: Emergency Medicine | Admitting: Emergency Medicine

## 2014-10-03 ENCOUNTER — Encounter (HOSPITAL_COMMUNITY): Payer: Self-pay | Admitting: Emergency Medicine

## 2014-10-03 ENCOUNTER — Emergency Department (HOSPITAL_COMMUNITY): Payer: BLUE CROSS/BLUE SHIELD

## 2014-10-03 DIAGNOSIS — Z720 Tobacco use: Secondary | ICD-10-CM | POA: Insufficient documentation

## 2014-10-03 DIAGNOSIS — R1013 Epigastric pain: Secondary | ICD-10-CM | POA: Diagnosis not present

## 2014-10-03 DIAGNOSIS — R1011 Right upper quadrant pain: Secondary | ICD-10-CM | POA: Diagnosis not present

## 2014-10-03 DIAGNOSIS — R110 Nausea: Secondary | ICD-10-CM | POA: Diagnosis not present

## 2014-10-03 DIAGNOSIS — Z79899 Other long term (current) drug therapy: Secondary | ICD-10-CM | POA: Insufficient documentation

## 2014-10-03 DIAGNOSIS — R0789 Other chest pain: Secondary | ICD-10-CM | POA: Diagnosis not present

## 2014-10-03 DIAGNOSIS — Z859 Personal history of malignant neoplasm, unspecified: Secondary | ICD-10-CM | POA: Insufficient documentation

## 2014-10-03 DIAGNOSIS — R079 Chest pain, unspecified: Secondary | ICD-10-CM

## 2014-10-03 DIAGNOSIS — R109 Unspecified abdominal pain: Secondary | ICD-10-CM

## 2014-10-03 LAB — I-STAT TROPONIN, ED: TROPONIN I, POC: 0 ng/mL (ref 0.00–0.08)

## 2014-10-03 LAB — CBC
HEMATOCRIT: 41 % (ref 39.0–52.0)
HEMOGLOBIN: 14.1 g/dL (ref 13.0–17.0)
MCH: 30.3 pg (ref 26.0–34.0)
MCHC: 34.4 g/dL (ref 30.0–36.0)
MCV: 88 fL (ref 78.0–100.0)
Platelets: 297 10*3/uL (ref 150–400)
RBC: 4.66 MIL/uL (ref 4.22–5.81)
RDW: 13.5 % (ref 11.5–15.5)
WBC: 13.1 10*3/uL — ABNORMAL HIGH (ref 4.0–10.5)

## 2014-10-03 LAB — BASIC METABOLIC PANEL
Anion gap: 10 (ref 5–15)
BUN: 16 mg/dL (ref 6–20)
CO2: 21 mmol/L — ABNORMAL LOW (ref 22–32)
Calcium: 8.7 mg/dL — ABNORMAL LOW (ref 8.9–10.3)
Chloride: 108 mmol/L (ref 101–111)
Creatinine, Ser: 0.96 mg/dL (ref 0.61–1.24)
Glucose, Bld: 110 mg/dL — ABNORMAL HIGH (ref 65–99)
Potassium: 4 mmol/L (ref 3.5–5.1)
SODIUM: 139 mmol/L (ref 135–145)

## 2014-10-03 NOTE — ED Provider Notes (Signed)
CSN: [id number]     Arrival date & time 10/03/14  2026 History   First MD Initiated Contact with Patient 10/03/14 2125     Chief Complaint  Patient presents with  . Chest Pain     (Consider location/radiation/quality/duration/timing/severity/associated sxs/prior Treatment) Patient is a 64 y.o. male presenting with chest pain.  Chest Pain Pain location:  Epigastric and substernal area Pain quality: burning   Pain radiates to:  Does not radiate Pain radiates to the back: no   Pain severity:  Moderate Onset quality:  Gradual Duration:  15 minutes Timing:  Constant Progression:  Unchanged Chronicity:  New Context comment:  Spontaneous, longstanding history of chronic identical pain Relieved by:  Nothing Worsened by:  Nothing tried Ineffective treatments:  None tried Associated symptoms: abdominal pain and nausea   Associated symptoms: no anxiety, no cough, no heartburn and not vomiting     Past Medical History  Diagnosis Date  . Cancer    Past Surgical History  Procedure Laterality Date  . Thyroidectomy     No family history on file. History  Substance Use Topics  . Smoking status: Current Every Day Smoker  . Smokeless tobacco: Not on file  . Alcohol Use: Yes    Review of Systems  Respiratory: Negative for cough.   Cardiovascular: Positive for chest pain.  Gastrointestinal: Positive for nausea and abdominal pain. Negative for heartburn and vomiting.  All other systems reviewed and are negative.     Allergies  Morphine and related  Home Medications   Prior to Admission medications   Medication Sig Start Date End Date Taking? Authorizing Provider  amphetamine-dextroamphetamine (ADDERALL) 20 MG tablet Take 20 mg by mouth daily as needed (mental concentration).    Yes Historical Provider, MD  levothyroxine (SYNTHROID, LEVOTHROID) 200 MCG tablet Take 200 mcg by mouth daily before breakfast. Takes with 25  mcg to equal 225 mcg   Yes Historical Provider, MD   levothyroxine (SYNTHROID, LEVOTHROID) 25 MCG tablet Take 25 mcg by mouth daily before breakfast. Takes with 200 mcg to equal 225 mcg   Yes Historical Provider, MD  mirtazapine (REMERON) 45 MG tablet Take 45 mg by mouth at bedtime.   Yes Historical Provider, MD  omeprazole (PRILOSEC) 40 MG capsule Take 40 mg by mouth 2 (two) times daily.   Yes Historical Provider, MD  traMADol (ULTRAM) 50 MG tablet Take 50 mg by mouth every 8 (eight) hours as needed for moderate pain.    Yes Historical Provider, MD   BP 105/56 mmHg  Pulse 77  Temp(Src) 98.7 F (37.1 C) (Oral)  Resp 11  SpO2 98% Physical Exam  Constitutional: He is oriented to person, place, and time. He appears well-developed and well-nourished.  HENT:  Head: Normocephalic and atraumatic.  Eyes: Conjunctivae and EOM are normal.  Neck: Normal range of motion. Neck supple.  Cardiovascular: Normal rate, regular rhythm and normal heart sounds.   Pulmonary/Chest: Effort normal and breath sounds normal. No respiratory distress.  Abdominal: He exhibits no distension. There is tenderness in the right upper quadrant and epigastric area. There is no rebound and no guarding.  Musculoskeletal: Normal range of motion.  Neurological: He is alert and oriented to person, place, and time.  Skin: Skin is warm and dry.  Vitals reviewed.   ED Course  Procedures (including critical care time) Labs Review Labs Reviewed  CBC - Abnormal; Notable for the following:    WBC 13.1 (*)    All other components within normal limits  BASIC METABOLIC PANEL - Abnormal; Notable for the following:    CO2 21 (*)    Glucose, Bld 110 (*)    Calcium 8.7 (*)    All other components within normal limits  I-STAT TROPOININ, ED  Randolm Idol, ED    Imaging Review Dg Chest 2 View  10/03/2014   CLINICAL DATA:  Intermittent right sided upper abdominal pain and lower chest pain for 5 years. Current episodes of pain averages 3 to 5 times a month. Increased shortness  of breath exertion.  EXAM: CHEST  2 VIEW  COMPARISON:  04/29/2010  FINDINGS: Mild hyperinflation. Linear fibrosis in the right lung base. No focal airspace disease or consolidation. No blunting of costophrenic angles. No pneumothorax. Heart size and pulmonary vascularity are normal. Surgical clips in the base of the neck.  IMPRESSION: Emphysematous changes and linear fibrosis in the lungs. No evidence of active pulmonary disease.   Electronically Signed   By: Lucienne Capers M.D.   On: 10/03/2014 21:17   US Abdomen Limited  10/03/2014   CLINICAL DATA:  Acute onset of generalized abdominal pain. Initial encounter.  EXAM: US ABDOMEN LIMITED - RIGHT UPPER QUADRANT  COMPARISON:  CT of the abdomen and pelvis from 05/30/2007  FINDINGS: Gallbladder:  No gallstones or wall thickening visualized. Minimal ring down artifact at the gallbladder fundus may reflect mild focal adenomyomatosis. No sonographic Murphy sign noted.  Common bile duct:  Diameter: 0.4 cm, within normal limits in caliber.  Liver:  Two mildly echogenic foci are noted within the liver, measuring 2.7 cm at the right hepatic lobe and 2.4 cm at the left hepatic lobe. These were identified as hemangiomas in 2004. The liver is otherwise unremarkable in appearance, with grossly normal parenchymal echogenicity.  IMPRESSION: 1. No acute abnormality seen at the right upper quadrant. 2. 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 Gallbladder otherwise unremarkable. 3. Hepatic hemangiomas again noted.   Electronically Signed   By: Garald Balding M.D.   On: 10/03/2014 23:48     EKG Interpretation   Date/Time:  Thursday October 03 2014 20:38:12 EDT Ventricular Rate:  104 PR Interval:  170 QRS Duration: 106 QT Interval:  340 QTC Calculation: 447 R Axis:   30 Text Interpretation:  Sinus tachycardia Incomplete right bundle branch  block Borderline ECG No significant change since last tracing Confirmed by  Debby Freiberg [phone]) on 10/03/2014  9:40:40 PM      MDM   Final diagnoses:  Abdominal pain, acute  Chest pain, unspecified chest pain type    64 y.o. male with pertinent PMH of chronic chest and abd pain presents with acute on chronic similar symptoms as above.  History atypical for ACS, primarily appear epigastric and RUQ.    Wu as above unremarkable.  Likely recurrent chronic pain.  Will have pt fu with PCP.  I have reviewed all laboratory and imaging studies if ordered as above  1. Abdominal pain, acute   2. Chest pain, unspecified chest pain type         Debby Freiberg, MD 10/04/14 0111

## 2014-10-03 NOTE — ED Notes (Addendum)
Pt. Reports intermittent  right lower chest / RUQ pain for 4 years getting frequent these past several days after eating , mild nausea , no emesis or diarrhea , no SOB .

## 2014-10-03 NOTE — ED Notes (Signed)
Pt states pain is in the hip mostly now. No pain in the chest.

## 2014-10-04 LAB — I-STAT TROPONIN, ED: Troponin i, poc: 0 ng/mL (ref 0.00–0.08)

## 2014-10-04 MED ORDER — OXYCODONE-ACETAMINOPHEN 5-325 MG PO TABS
1.0000 | ORAL_TABLET | Freq: Once | ORAL | Status: AC
Start: 2014-10-04 — End: 2014-10-04
  Administered 2014-10-04: 1 via ORAL
  Filled 2014-10-04: qty 1

## 2014-10-04 NOTE — Discharge Instructions (Signed)
Chest Pain (Nonspecific) °It is often hard to give a specific diagnosis for the cause of chest pain. There is always a chance that your pain could be related to something serious, such as a heart attack or a blood clot in the lungs. You need to follow up with your health care provider for further evaluation. °CAUSES  °· Heartburn. °· Pneumonia or bronchitis. °· Anxiety or stress. °· Inflammation around your heart (pericarditis) or lung (pleuritis or pleurisy). °· A blood clot in the lung. °· A collapsed lung (pneumothorax). It can develop suddenly on its own (spontaneous pneumothorax) or from trauma to the chest. °· Shingles infection (herpes zoster virus). °The chest wall is composed of bones, muscles, and cartilage. Any of these can be the source of the pain. °· The bones can be bruised by injury. °· The muscles or cartilage can be strained by coughing or overwork. °· The cartilage can be affected by inflammation and become sore (costochondritis). °DIAGNOSIS  °Lab tests or other studies may be needed to find the cause of your pain. Your health care provider may have you take a test called an ambulatory electrocardiogram (ECG). An ECG records your heartbeat patterns over a 24-hour period. You may also have other tests, such as: °· Transthoracic echocardiogram (TTE). During echocardiography, sound waves are used to evaluate how blood flows through your heart. °· Transesophageal echocardiogram (TEE). °· Cardiac monitoring. This allows your health care provider to monitor your heart rate and rhythm in real time. °· Holter monitor. This is a portable device that records your heartbeat and can help diagnose heart arrhythmias. It allows your health care provider to track your heart activity for several days, if needed. °· Stress tests by exercise or by giving medicine that makes the heart beat faster. °TREATMENT  °· Treatment depends on what may be causing your chest pain. Treatment may include: °· Acid blockers for  heartburn. °· Anti-inflammatory medicine. °· Pain medicine for inflammatory conditions. °· Antibiotics if an infection is present. °· You may be advised to change lifestyle habits. This includes stopping smoking and avoiding alcohol, caffeine, and chocolate. °· You may be advised to keep your head raised (elevated) when sleeping. This reduces the chance of acid going backward from your stomach into your esophagus. °Most of the time, nonspecific chest pain will improve within 2-3 days with rest and mild pain medicine.  °HOME CARE INSTRUCTIONS  °· If antibiotics were prescribed, take them as directed. Finish them even if you start to feel better. °· For the next few days, avoid physical activities that bring on chest pain. Continue physical activities as directed. °· Do not use any tobacco products, including cigarettes, chewing tobacco, or electronic cigarettes. °· Avoid drinking alcohol. °· Only take medicine as directed by your health care provider. °· Follow your health care provider's suggestions for further testing if your chest pain does not go away. °· Keep any follow-up appointments you made. If you do not go to an appointment, you could develop lasting (chronic) problems with pain. If there is any problem keeping an appointment, call to reschedule. °SEEK MEDICAL CARE IF:  °· Your chest pain does not go away, even after treatment. °· You have a rash with blisters on your chest. °· You have a fever. °SEEK IMMEDIATE MEDICAL CARE IF:  °· You have increased chest pain or pain that spreads to your arm, neck, jaw, back, or abdomen. °· You have shortness of breath. °· You have an increasing cough, or you cough   up blood.  You have severe back or abdominal pain.  You feel nauseous or vomit.  You have severe weakness.  You faint.  You have chills. This is an emergency. Do not wait to see if the pain will go away. Get medical help at once. Call your local emergency services (911 in [place] Do not drive  yourself to the hospital. MAKE SURE YOU:   Understand these instructions.  Will watch your condition.  Will get help right away if you are not doing well or get worse. Document Released: 01/13/2005 Document Revised: 04/10/2013 Document Reviewed: 11/09/2007 [facility] Patient Information 2015 [place], [place]. This information is not intended to replace advice given to you by your health care provider. Make sure you discuss any questions you have with your health care provider.  Abdominal Pain Many things can cause abdominal pain. Usually, abdominal pain is not caused by a disease and will improve without treatment. It can often be observed and treated at home. Your health care provider will do a physical exam and possibly order blood tests and X-rays to help determine the seriousness of your pain. However, in many cases, more time must pass before a clear cause of the pain can be found. Before that point, your health care provider may not know if you need more testing or further treatment. HOME CARE INSTRUCTIONS  Monitor your abdominal pain for any changes. The following actions may help to alleviate any discomfort you are experiencing:  Only take over-the-counter or prescription medicines as directed by your health care provider.  Do not take laxatives unless directed to do so by your health care provider.  Try a clear liquid diet (broth, tea, or water) as directed by your health care provider. Slowly move to a bland diet as tolerated. SEEK MEDICAL CARE IF:  You have unexplained abdominal pain.  You have abdominal pain associated with nausea or diarrhea.  You have pain when you urinate or have a bowel movement.  You experience abdominal pain that wakes you in the night.  You have abdominal pain that is worsened or improved by eating food.  You have abdominal pain that is worsened with eating fatty foods.  You have a fever. SEEK IMMEDIATE MEDICAL CARE IF:   Your pain does not go  away within 2 hours.  You keep throwing up (vomiting).  Your pain is felt only in portions of the abdomen, such as the right side or the left lower portion of the abdomen.  You pass bloody or black tarry stools. MAKE SURE YOU:  Understand these instructions.   Will watch your condition.   Will get help right away if you are not doing well or get worse.  Document Released: 01/13/2005 Document Revised: 04/10/2013 Document Reviewed: 12/13/2012 [facility] Patient Information 2015 [place], [place]. This information is not intended to replace advice given to you by your health care provider. Make sure you discuss any questions you have with your health care provider.

## 2014-11-02 ENCOUNTER — Encounter (HOSPITAL_COMMUNITY): Payer: Self-pay

## 2014-11-02 ENCOUNTER — Emergency Department (HOSPITAL_COMMUNITY)
Admission: EM | Admit: 2014-11-02 | Discharge: 2014-11-02 | Disposition: A | Discharge status: Home / Self Care | Payer: BLUE CROSS/BLUE SHIELD | Attending: Emergency Medicine | Admitting: Emergency Medicine

## 2014-11-02 DIAGNOSIS — G8929 Other chronic pain: Secondary | ICD-10-CM

## 2014-11-02 DIAGNOSIS — Z859 Personal history of malignant neoplasm, unspecified: Secondary | ICD-10-CM | POA: Insufficient documentation

## 2014-11-02 DIAGNOSIS — Z720 Tobacco use: Secondary | ICD-10-CM | POA: Insufficient documentation

## 2014-11-02 DIAGNOSIS — R109 Unspecified abdominal pain: Secondary | ICD-10-CM

## 2014-11-02 DIAGNOSIS — Z79899 Other long term (current) drug therapy: Secondary | ICD-10-CM | POA: Diagnosis not present

## 2014-11-02 DIAGNOSIS — R1013 Epigastric pain: Secondary | ICD-10-CM | POA: Diagnosis not present

## 2014-11-02 DIAGNOSIS — R1011 Right upper quadrant pain: Secondary | ICD-10-CM | POA: Diagnosis not present

## 2014-11-02 HISTORY — DX: Unspecified abdominal pain: R10.9

## 2014-11-02 HISTORY — DX: Other chronic pain: G89.29

## 2014-11-02 LAB — COMPREHENSIVE METABOLIC PANEL
ALBUMIN: 3.8 g/dL (ref 3.5–5.0)
ALT: 13 U/L — ABNORMAL LOW (ref 17–63)
ANION GAP: 9 (ref 5–15)
AST: 14 U/L — ABNORMAL LOW (ref 15–41)
Alkaline Phosphatase: 79 U/L (ref 38–126)
BILIRUBIN TOTAL: 0.5 mg/dL (ref 0.3–1.2)
BUN: 18 mg/dL (ref 6–20)
CHLORIDE: 110 mmol/L (ref 101–111)
CO2: 22 mmol/L (ref 22–32)
CREATININE: 1.09 mg/dL (ref 0.61–1.24)
Calcium: 8.4 mg/dL — ABNORMAL LOW (ref 8.9–10.3)
GFR calc Af Amer: >60 mL/min (ref >60)
Glucose, Bld: 118 mg/dL — ABNORMAL HIGH (ref 65–99)
Potassium: 3.9 mmol/L (ref 3.5–5.1)
Sodium: 141 mmol/L (ref 135–145)
Total Protein: 6.6 g/dL (ref 6.5–8.1)

## 2014-11-02 LAB — URINALYSIS, ROUTINE W REFLEX MICROSCOPIC
BILIRUBIN URINE: NEGATIVE
Glucose, UA: NEGATIVE mg/dL
HGB URINE DIPSTICK: NEGATIVE
Ketones, ur: NEGATIVE mg/dL
Leukocytes, UA: NEGATIVE
NITRITE: NEGATIVE
PH: 6 (ref 5.0–8.0)
PROTEIN: NEGATIVE mg/dL
SPECIFIC GRAVITY, URINE: 1.03 (ref 1.005–1.030)
UROBILINOGEN UA: 0.2 mg/dL (ref 0.0–1.0)

## 2014-11-02 LAB — CBC WITH DIFFERENTIAL/PLATELET
BASOS ABS: 0 10*3/uL (ref 0.0–0.1)
Basophils Relative: 0 % (ref 0–1)
Eosinophils Absolute: 0.2 10*3/uL (ref 0.0–0.7)
Eosinophils Relative: 2 % (ref 0–5)
HEMATOCRIT: 39.4 % (ref 39.0–52.0)
Hemoglobin: 13.6 g/dL (ref 13.0–17.0)
LYMPHS PCT: 31 % (ref 12–46)
Lymphs Abs: 3.2 10*3/uL (ref 0.7–4.0)
MCH: 30.2 pg (ref 26.0–34.0)
MCHC: 34.5 g/dL (ref 30.0–36.0)
MCV: 87.6 fL (ref 78.0–100.0)
MONO ABS: 0.6 10*3/uL (ref 0.1–1.0)
Monocytes Relative: 6 % (ref 3–12)
NEUTROS ABS: 6.3 10*3/uL (ref 1.7–7.7)
Neutrophils Relative %: 61 % (ref 43–77)
PLATELETS: 289 10*3/uL (ref 150–400)
RBC: 4.5 MIL/uL (ref 4.22–5.81)
RDW: 13.3 % (ref 11.5–15.5)
WBC: 10.3 10*3/uL (ref 4.0–10.5)

## 2014-11-02 LAB — I-STAT TROPONIN, ED: Troponin i, poc: 0 ng/mL (ref 0.00–0.08)

## 2014-11-02 LAB — LIPASE, BLOOD: Lipase: 28 U/L (ref 22–51)

## 2014-11-02 MED ORDER — OXYCODONE-ACETAMINOPHEN 5-325 MG PO TABS
2.0000 | ORAL_TABLET | Freq: Once | ORAL | Status: AC
  Administered 2014-11-02: 2 via ORAL
  Filled 2014-11-02: qty 2

## 2014-11-02 MED ORDER — OXYCODONE-ACETAMINOPHEN 5-325 MG PO TABS
1.0000 | ORAL_TABLET | ORAL | Status: DC | PRN
Start: 2014-11-02 — End: 2014-11-06

## 2014-11-02 NOTE — ED Notes (Signed)
He c/o right-sided epigastric pain radiating into his back "every day for five years".  He has had numerous diagnostics; none of which elucidate the cause of this recurrent pain.  EMS were called d/t pt. Being observed "doubled over by the side of the road".  He states that the pain has markedly improved from its peak about an hour ago.

## 2014-11-02 NOTE — Discharge Instructions (Signed)
Take the prescribed medication as directed. Follow-up with GI office-- call to make appt. Return to the ED for new or worsening symptoms.

## 2014-11-02 NOTE — ED Notes (Signed)
Bed: XJ15 Expected date:  Expected time:  Means of arrival:  Comments: EMS-Substernal CP x5 yrs

## 2014-11-02 NOTE — ED Provider Notes (Signed)
CSN: [id number]     Arrival date & time 11/02/14  1609 History   First MD Initiated Contact with Patient 11/02/14 1610     Chief Complaint  Patient presents with  . Abdominal Pain     (Consider location/radiation/quality/duration/timing/severity/associated sxs/prior Treatment) Patient is a 64 y.o. male presenting with abdominal pain. The history is provided by the patient and medical records.  Abdominal Pain   This is a 64 year old male with past medical history significant for cancer and chronic abdominal pain, presenting to the ED for epigastric pain. Patient states for the past 5 years he has experienced daily abdominal pain. He states generally episodes will last 5-10 minutes, described as sharp and stabbing.  States over the past few months episodes have been occurring twice daily, sometimes lasting up to 30-45 minutes in duration. He states today he had 3 episodes of pain, therefore he became concerned. He states episode today occurred while driving and he had to pull over and call EMS. He denies any fever or chills. No nausea, vomiting, or diarrhea. No significant weight loss.  He denies any chest pain or shortness of breath. Patient was formally followed by our gastroenterology, however has not seen him in several years since moving to [facility].  He states he is due for another colonoscopy this year he thinks.  States he has had multiple work-ups, recent visit to [place] last month for possible gallstones vs cholecystitis-- lab work and ultrasound were negative for gallstones at that time.  Patient states he is very frustrated no formal diagnosis has been given.  On arrival, pain currently 4/10, greatly improved from earlier.  Past Medical History  Diagnosis Date  . Cancer   . Chronic abdominal pain    Past Surgical History  Procedure Laterality Date  . Thyroidectomy     No family history on file. History  Substance Use Topics  . Smoking status: Current Every Day Smoker   . Smokeless tobacco: Not on file  . Alcohol Use: Yes    Review of Systems  Gastrointestinal: Positive for abdominal pain.  All other systems reviewed and are negative.     Allergies  Morphine and related  Home Medications   Prior to Admission medications   Medication Sig Start Date End Date Taking? Authorizing Provider  amphetamine-dextroamphetamine (ADDERALL) 20 MG tablet Take 20 mg by mouth 2 (two) times daily as needed (mental concentration).    Yes Historical Provider, MD  levothyroxine (SYNTHROID, LEVOTHROID) 200 MCG tablet Take 200 mcg by mouth daily before breakfast. Takes with 25  mcg to equal 225 mcg   Yes Historical Provider, MD  levothyroxine (SYNTHROID, LEVOTHROID) 25 MCG tablet Take 25 mcg by mouth daily before breakfast. Takes with 200 mcg to equal 225 mcg   Yes Historical Provider, MD  mirtazapine (REMERON) 45 MG tablet Take 45 mg by mouth at bedtime.   Yes Historical Provider, MD  omeprazole (PRILOSEC) 40 MG capsule Take 40 mg by mouth 2 (two) times daily.   Yes Historical Provider, MD  rosuvastatin (CRESTOR) 20 MG tablet Take 20 mg by mouth daily.   Yes Historical Provider, MD  traMADol (ULTRAM) 50 MG tablet Take 50 mg by mouth every 8 (eight) hours as needed for moderate pain.    Yes Historical Provider, MD   BP 119/67 mmHg  Pulse 87  Temp(Src) 98.1 F (36.7 C) (Oral)  Resp 16  SpO2 99%   Physical Exam  Constitutional: He is oriented to person, place, and time. He  appears well-developed and well-nourished.  HENT:  Head: Normocephalic and atraumatic.  Mouth/Throat: Oropharynx is clear and moist.  Eyes: Conjunctivae and EOM are normal. Pupils are equal, round, and reactive to light.  Neck: Normal range of motion.  Cardiovascular: Normal rate, regular rhythm and normal heart sounds.   Pulmonary/Chest: Effort normal and breath sounds normal. No respiratory distress. He has no wheezes.  Abdominal: Soft. Bowel sounds are normal. There is tenderness in the right  upper quadrant and epigastric area. There is no CVA tenderness, no tenderness at McBurney's point and negative Murphy's sign.  Musculoskeletal: Normal range of motion.  Neurological: He is alert and oriented to person, place, and time.  Skin: Skin is warm and dry.  Psychiatric: He has a normal mood and affect.  Nursing note and vitals reviewed.   ED Course  Procedures (including critical care time) Labs Review Labs Reviewed  COMPREHENSIVE METABOLIC PANEL - Abnormal; Notable for the following:    Glucose, Bld 118 (*)    Calcium 8.4 (*)    AST 14 (*)    ALT 13 (*)    All other components within normal limits  CBC WITH DIFFERENTIAL/PLATELET  LIPASE, BLOOD  URINALYSIS, ROUTINE W REFLEX MICROSCOPIC (NOT AT [facility])  I-STAT TROPOININ, ED    Imaging Review No results found.   EKG Interpretation None      MDM   Final diagnoses:  Chronic abdominal pain   64 year old male here with epigastric pain. History of chronic abdominal pain over the past 5 years. He has had multiple evaluations without known cause of his symptoms, no hx of gallstones. Patient is afebrile, nontoxic. He does have some mild tenderness in his epigastrium and RUQ without rebound or guarding.  No peritonitis.  Lab work reassuring, trop negative.  Sx would be atypical or ACS, PE, dissection, or other acute cardiac event at this time.  Suspect due to his chronic abdominal pain.  Patient will be referred to GI as he is due for colonscopy.  Few percocet given for pain.  Discussed plan with patient, he/she acknowledged understanding and agreed with plan of care.  Return precautions given for new or worsening symptoms.  Larene Pickett, PA-C 11/02/14 1904  Evelina Bucy, MD 11/02/14 [phone]

## 2014-11-06 ENCOUNTER — Other Ambulatory Visit (INDEPENDENT_AMBULATORY_CARE_PROVIDER_SITE_OTHER): Payer: BLUE CROSS/BLUE SHIELD

## 2014-11-06 ENCOUNTER — Encounter: Payer: Self-pay | Admitting: Nurse Practitioner

## 2014-11-06 ENCOUNTER — Encounter: Payer: Self-pay | Admitting: Internal Medicine

## 2014-11-06 ENCOUNTER — Ambulatory Visit (INDEPENDENT_AMBULATORY_CARE_PROVIDER_SITE_OTHER): Payer: BLUE CROSS/BLUE SHIELD | Admitting: Nurse Practitioner

## 2014-11-06 VITALS — BP 114/68 | HR 88 | Ht 72.0 in | Wt 170.5 lb

## 2014-11-06 DIAGNOSIS — R1031 Right lower quadrant pain: Secondary | ICD-10-CM

## 2014-11-06 DIAGNOSIS — R1011 Right upper quadrant pain: Secondary | ICD-10-CM | POA: Insufficient documentation

## 2014-11-06 LAB — BASIC METABOLIC PANEL
BUN: 15 mg/dL (ref 6–23)
CHLORIDE: 103 meq/L (ref 96–112)
CO2: 28 mEq/L (ref 19–32)
Calcium: 9.2 mg/dL (ref 8.4–10.5)
Creatinine, Ser: 0.84 mg/dL (ref 0.40–1.50)
GFR: 98.47 mL/min (ref >60.00)
Glucose, Bld: 101 mg/dL — ABNORMAL HIGH (ref 70–99)
POTASSIUM: 3.9 meq/L (ref 3.5–5.1)
Sodium: 139 mEq/L (ref 135–145)

## 2014-11-06 MED ORDER — OXYCODONE-ACETAMINOPHEN 5-325 MG PO TABS: 1.0000 | ORAL_TABLET | Freq: Four times a day (QID) | ORAL | Status: DC | PRN

## 2014-11-06 NOTE — Telephone Encounter (Signed)
Error

## 2014-11-06 NOTE — Patient Instructions (Addendum)
Please go to the basement level to have your labs drawn.    You have been scheduled for a CT scan of the abdomen and pelvis at [facility] ([address]---this is in the same building as Press [facility]).   You are scheduled on Friday 11-08-2014 at 9:30 am . You should arrive 9:15  prior to your appointment time for registration. Please follow the written instructions below on the day of your exam:  WARNING: IF YOU ARE ALLERGIC TO IODINE/X-RAY DYE, PLEASE NOTIFY RADIOLOGY IMMEDIATELY AT [phone]! YOU WILL BE GIVEN A 13 HOUR PREMEDICATION PREP.  1) Do not eat or drink anything after  5:30 am  (4 hours prior to your test) 2) You have been given 2 bottles of oral contrast to drink. The solution may taste better if refrigerated, but do NOT add ice or any other liquid to this solution. Shake well before drinking.    Drink 1 bottle of contrast @  7:30 am  (2 hours prior to your exam)  Drink 1 bottle of contrast @  8:30 am  (1 hour prior to your exam)  You may take any medications as prescribed with a small amount of water except for the following: Metformin, Glucophage, Glucovance, Avandamet, Riomet, Fortamet, Actoplus Met, Janumet, Glumetza or Metaglip. The above medications must be held the day of the exam AND 48 hours after the exam.  The purpose of you drinking the oral contrast is to aid in the visualization of your intestinal tract. The contrast solution may cause some diarrhea. Before your exam is started, you will be given a small amount of fluid to drink. Depending on your individual set of symptoms, you may also receive an intravenous injection of x-ray contrast/dye. Plan on being at [facility] for 30 minutes or long, depending on the type of exam you are having performed.  If you have any questions regarding your exam or if you need to reschedule, you may call the CT department at [phone] between the hours of 8:00 am and 5:00 pm,  Monday-Friday.  ________________________________________________________________________

## 2014-11-06 NOTE — Progress Notes (Addendum)
HPI :  Patient is a 64 year old male who was seen here years ago by Dr. Lyla Son. He is in process of moving back to [place] from [place] area. He comes in today for evaluation of abdominal pain. Patient has a several year history of intermittent epigastric pain radiating around right side into right shoulder blade. For years pain was very intermittent but in the last few months episodes have become much more frequent. He experiences these episodes at least twice a day. Patient cannot say for sure that pain is related to eating nor is it positional.  Episodes last no longer than 15-20 minutes then spontaneously resolve. The pain is quite severe and has prompted him to be seen in the emergency department 3 times since early June. Other than a white count of 13 on one of the ED visits his labs have been unremarkable. Ultrasound unremarkable. Patient was living in [place] until recently. He had an EGD by Dr.Klein in [place],  [place] about a year ago. I do not have the records but it sounds like his esophagus was dilated,  no significant abnormalities were found   Patient tells me he has some disc problems in his back. He has problems with numbness and pain in left leg.  Occasionally he gets a sharp pain in his right groin for unclear reasons.  Past Medical History  Diagnosis Date  . Thyroid cancer   . Chronic abdominal pain   . Colon polyps     Family History  Problem Relation Age of Onset  . Kidney cancer Father   . Hypertension Mother   . Hypertension Father   . Hyperthyroidism Brother   . Thyroid cancer Maternal Uncle    History  Substance Use Topics  . Smoking status: Current Every Day Smoker    Types: Cigarettes  . Smokeless tobacco: Never Used  . Alcohol Use: 0.0 oz/week    0 Standard drinks or equivalent per week   Current Outpatient Prescriptions  Medication Sig Dispense Refill  . amphetamine-dextroamphetamine (ADDERALL) 20 MG tablet Take 20 mg  by mouth 2 (two) times daily as needed (mental concentration).     Marland Kitchen levothyroxine (SYNTHROID, LEVOTHROID) 200 MCG tablet Take 200 mcg by mouth daily before breakfast. Takes with 25  mcg to equal 225 mcg    . levothyroxine (SYNTHROID, LEVOTHROID) 25 MCG tablet Take 25 mcg by mouth daily before breakfast. Takes with 200 mcg to equal 225 mcg    . mirtazapine (REMERON) 45 MG tablet Take 45 mg by mouth at bedtime.    Marland Kitchen omeprazole (PRILOSEC) 40 MG capsule Take 40 mg by mouth 2 (two) times daily.    . rosuvastatin (CRESTOR) 20 MG tablet Take 20 mg by mouth daily.     No current facility-administered medications for this visit.   Allergies  Allergen Reactions  . Morphine And Related     Headaches     Review of Systems Positive for anxiety, arthritis, back pain, fatigue, sleep problems and swelling of feet and legs. All  other systems reviewed and negative except where noted in HPI.   Physical Exam: BP 114/68 mmHg  Pulse 88  Ht 6' (1.829 m)  Wt 170 lb 8 oz (77.338 kg)  BMI 23.12 kg/m2 Constitutional: Pleasant,well-developed, white male in no acute distress. HEENT: Normocephalic and atraumatic. Conjunctivae are normal. No scleral icterus. Neck supple.  Cardiovascular: Normal rate, regular rhythm.  Pulmonary/chest: Effort normal and breath sounds normal. No wheezing, rales or rhonchi. Abdominal: Soft,  nondistended,mild RUQ discomfort just below right rib cage. Bowel sounds active throughout. There are no masses palpable. No hepatomegaly. Extremities: no edema Lymphadenopathy: No cervical adenopathy noted. Neurological: Alert and oriented to person place and time. Skin: Skin is warm and dry. No rashes noted. Psychiatric: Normal mood and affect. Behavior is normal.   ASSESSMENT AND PLAN:  79. 64 year old male with chronic epigastric pain radiating around the right side into his right back under shoulder blade. Pain started about 5 years ago, initially infrequent but over last 2 months pain  occurring on a daily basis. He has had 3 emergency room department visits in early June (patient recently relocated back to [place]). ED labs unremarkable except isolated white count of 13 on one of the visits. Ultrasound unremarkable. Patient feels okay in between episodes of pain. No weight loss.  Etiology of pain not clear. It isn't unreasonable to pursue CTscan for further evaluation. I don't think repeat EGD will be helpful. Will will request report from [facility].   2. Colon cancer screening. Patient thinks he had a colonoscopy about 5 years ago in [place]. States that was the first time polyps were not found. It does not sound like patient is due for colonoscopy, will defer that discussion until the time of his next visit here  3. Hx of thyroid cancer  Addendum: prior to medical assistant escorting patient out of the room, patient bent over with severe right lower quadrant pain. He requested Percocet. Patient got a few Percocet in the emergency department a few days ago. I will only give him #20. Await CTscan results.   Addendum: Reviewed and agree with initial management. Jerene Bears, MD    Addendum: Records from [facility] received.   EGD 06/11/14 by Virl Axe, M.D. A small size hiatal hernia was seen, hiatal narrowing at 44 cm from the incisors. Otherwise normal esophagus which was empirically dilated. There was patchy erythema in the antrum compatible with gastritis. Gastric biopsies compatible with chronic, inactive gastritis with parietal cell hyperplasia. Gastroesophageal junction biopsies compatible with GERD. Stain negative for intestinal metaplasia.  Patient seen by Arta Bruce, PA following my visit with him. He is scheduled for a HIDA. So far his workup has been unrevealing. Marland Kitchen

## 2014-11-07 ENCOUNTER — Encounter: Payer: Self-pay | Admitting: Nurse Practitioner

## 2014-11-08 ENCOUNTER — Ambulatory Visit (INDEPENDENT_AMBULATORY_CARE_PROVIDER_SITE_OTHER)
Admission: RE | Admit: 2014-11-08 | Discharge: 2014-11-08 | Disposition: A | Discharge status: Home / Self Care | Payer: BLUE CROSS/BLUE SHIELD | Source: Ambulatory Visit | Attending: Nurse Practitioner | Admitting: Nurse Practitioner

## 2014-11-08 DIAGNOSIS — R1031 Right lower quadrant pain: Secondary | ICD-10-CM

## 2014-11-08 DIAGNOSIS — R1011 Right upper quadrant pain: Secondary | ICD-10-CM | POA: Diagnosis not present

## 2014-11-08 MED ORDER — IOHEXOL 300 MG/ML  SOLN
100.0000 mL | Freq: Once | INTRAMUSCULAR | Status: AC | PRN
  Administered 2014-11-08: 100 mL via INTRAVENOUS

## 2014-11-13 ENCOUNTER — Telehealth: Payer: Self-pay | Admitting: Nurse Practitioner

## 2014-11-13 NOTE — Telephone Encounter (Signed)
He is going to need to see primary GI. Thanks

## 2014-11-13 NOTE — Telephone Encounter (Signed)
Patient would like to know the next step.  [name] please advise

## 2014-11-14 NOTE — Telephone Encounter (Signed)
Left message for patient to call back Will need to see Dr. Hilarie Fredrickson

## 2014-11-15 NOTE — Telephone Encounter (Signed)
Left message for patient to call back  

## 2014-11-18 NOTE — Telephone Encounter (Signed)
Not return call from the patient.  Will await a return call from the patient

## 2014-11-18 NOTE — Telephone Encounter (Signed)
Left message for patient to call back  

## 2014-11-19 ENCOUNTER — Telehealth: Payer: Self-pay | Admitting: Internal Medicine

## 2014-11-20 NOTE — Telephone Encounter (Signed)
Left message for pt to call back  °

## 2014-11-22 NOTE — Telephone Encounter (Signed)
Left message for pt that Dr. Hilarie Fredrickson does not have any sooner appts. Offered pt an appt with [facility] sooner if he wants to schedule. Left message for pt to call back and let us know what he wants to do.

## 2014-11-22 NOTE — Telephone Encounter (Signed)
Left message for pt to call back  °

## 2014-11-26 NOTE — Telephone Encounter (Signed)
After multiple attempts have been unable to reach pt. 

## 2014-11-27 ENCOUNTER — Ambulatory Visit (INDEPENDENT_AMBULATORY_CARE_PROVIDER_SITE_OTHER): Payer: BLUE CROSS/BLUE SHIELD | Admitting: Physician Assistant

## 2014-11-27 ENCOUNTER — Encounter: Payer: Self-pay | Admitting: Physician Assistant

## 2014-11-27 VITALS — BP 128/68 | HR 106 | Ht 73.0 in | Wt 166.4 lb

## 2014-11-27 DIAGNOSIS — R1011 Right upper quadrant pain: Secondary | ICD-10-CM

## 2014-11-27 MED ORDER — OXYCODONE-ACETAMINOPHEN 5-325 MG PO TABS: 1.0000 | ORAL_TABLET | Freq: Three times a day (TID) | ORAL | Status: DC | PRN

## 2014-11-27 MED ORDER — OXYCODONE-ACETAMINOPHEN 10-325 MG PO TABS: 1.0000 | ORAL_TABLET | Freq: Three times a day (TID) | ORAL | Status: DC | PRN

## 2014-11-27 NOTE — Progress Notes (Addendum)
Patient ID: Lee Mitchell, male   DOB: Aug 30, 1950, 64 y.o.   MRN: [id number]     History of Present Illness: Lee Mitchell is a 64 year old male who was last evaluated here on July 20. He recently relocated to [place] from the [place] area. He presents with intermittent right upper quadrant and epigastric pain that radiates to the right scapula. It is occasionally associated with nausea and bloating. He often feels like he has a tight band upon his ribs. His episodes can last from 15-20 minutes to was long as an hour and a half but they are typically not that long. His pain becomes so severe that he has had several emergency room visits. He has had an ultrasound that is unremarkable, normal laboratory studies, and a CT scan with no acute findings. He reports that he has had an EGD by Dr. Caryl Comes in [place] for a long-standing history of GERD and Barrett's esophagus. He says Dr. Caryl Comes or "another doctor" in [place] had told him once that some of his discomfort could be from esophageal spasm, but he doesn't think this is the case since he takes a PPI twice daily. He periodically has pain in the lower thoracic spine. He states that several years ago he was lying boarding and fell and hit his head and GM to his right shoulder into the sand. He states his right upper quadrant pain had started a few weeks for that incident.   Past Medical History  Diagnosis Date  . Thyroid cancer   . Chronic abdominal pain   . Colon polyps     Past Surgical History  Procedure Laterality Date  . Thyroidectomy      2004/2005  . Colonoscopy     Family History  Problem Relation Age of Onset  . Kidney cancer Father   . Hypertension Mother   . Hypertension Father   . Hyperthyroidism Brother   . Thyroid cancer Maternal Uncle    Social History  Substance Use Topics  . Smoking status: Current Every Day Smoker    Types: Cigarettes  . Smokeless tobacco: Never Used  . Alcohol Use: 0.0 oz/week    0  Standard drinks or equivalent per week   Current Outpatient Prescriptions  Medication Sig Dispense Refill  . amphetamine-dextroamphetamine (ADDERALL) 20 MG tablet Take 20 mg by mouth 2 (two) times daily as needed (mental concentration).     Marland Kitchen levothyroxine (SYNTHROID, LEVOTHROID) 200 MCG tablet Take 200 mcg by mouth daily before breakfast. Takes with 25  mcg to equal 225 mcg    . levothyroxine (SYNTHROID, LEVOTHROID) 25 MCG tablet Take 25 mcg by mouth daily before breakfast. Takes with 200 mcg to equal 225 mcg    . mirtazapine (REMERON) 45 MG tablet Take 45 mg by mouth at bedtime.    Marland Kitchen omeprazole (PRILOSEC) 40 MG capsule Take 40 mg by mouth 2 (two) times daily.    . rosuvastatin (CRESTOR) 20 MG tablet Take 20 mg by mouth daily.    Marland Kitchen oxyCODONE-acetaminophen (PERCOCET) 10-325 MG per tablet Take 1 tablet by mouth every 8 (eight) hours as needed for pain. 15 tablet 0   No current facility-administered medications for this visit.   Allergies  Allergen Reactions  . Morphine And Related     Headaches      Review of Systems: Positive for arthritis, back pain, anxiety, fatigue, sleep problems, and intermittent swelling of the feet and legs. All other systems reviewed and negative except for where noted in history of present illness.  LAB RESULTS: Comprehensive metabolic panel 83/66/2947 AST 14, ALT 13, alkaline phosphatase 79, total bili 0.5. Lipase 11/02/2014 CBC 11/02/2014 was white count 10.3, hemoglobin 13.6, hematocrit 39.4, platelets 289,000.  Studies:   Ct Abdomen Pelvis W Contrast  11/08/2014   CLINICAL DATA:  Chronic right-sided abdominal pain.  EXAM: CT ABDOMEN AND PELVIS WITH CONTRAST  TECHNIQUE: Multidetector CT imaging of the abdomen and pelvis was performed using the standard protocol following bolus administration of intravenous contrast.  CONTRAST:  157mL OMNIPAQUE IOHEXOL 300 MG/ML  SOLN  COMPARISON:  CT scan of May 30, 2007.  FINDINGS: Mild multilevel degenerative disc  disease is noted in the lumbar spine. Mild bilateral posterior basilar subsegmental atelectasis is noted.  No gallstones are noted. Probable hemangioma seen in posterior segment of right hepatic lobe. Another smaller hemangioma is noted medially in right hepatic lobe, as well as another probable hemangioma seen anteriorly in left hepatic lobe. These appear to be stable compared to prior exam. The spleen and pancreas appear normal. Adrenal glands and kidneys appear normal. No hydronephrosis or renal obstruction is noted. No renal or ureteral calculi are noted. The appendix appears normal. There is no evidence of bowel obstruction. Atherosclerosis of abdominal aorta is noted without aneurysm formation. No abnormal fluid collection is noted urinary bladder appears normal. No significant adenopathy is noted. Sigmoid diverticulosis is noted without inflammation.  IMPRESSION: Stable hepatic hemangiomas are noted.  Atherosclerosis of abdominal aorta without aneurysm formation.  Sigmoid diverticulosis without inflammation.  No hydronephrosis or renal obstruction is noted. No renal or ureteral calculi are noted.   Electronically Signed   By: Marijo Conception, M.D.   On: 11/08/2014 10:08     Physical Exam: General: Pleasant, well developed male in no acute distress Head: Normocephalic and atraumatic Eyes:  sclerae anicteric, conjunctiva pink  Ears: Normal auditory acuity Lungs: Clear throughout to auscultation Heart: Regular rate and rhythm Abdomen: Soft, non distended, mild tenderness epigastric area and right upper quadrant with no rebound or guarding, No masses, no hepatomegaly. Normal bowel sounds Musculoskeletal: Symmetrical with no gross deformities  Extremities: No edema  Neurological: Alert oriented x 4, grossly nonfocal Psychological:  Alert and cooperative. Normal mood and affect  Assessment and Recommendations:  64 year old male with chronic epigastric and right upper quadrant pain radiating to  the right scapula here for follow-up. Laboratory studies, ultrasound, and CT scan have been nonrevealing thus far. Patient will be scheduled for a HIDA scan with [facility] to evaluate gallbladder function. Patient has asked for a refill of his Percocet. He has a card with him that he has an appointment for his physician in [place] next Tuesday but he is out of the medication until then. He has been given a prescription for 15 tablets of Percocet and was advised that no further pain medications would be dispensed from this office. Further recommendations will be made pending the findings of his HIDA scan. If nonrevealing, ? If thoracic spine films may be of benefit and establishing a diagnosis. Patient has signed a medical release to obtain copies of his EGD and pathology reports from Dr. Virl Axe in [place], [place].  Martinique Pizzimenti, Vita Barley PA-C 11/27/2014,  Addendum: Reviewed and agree with ongoing management. Jerene Bears, MD

## 2014-11-27 NOTE — Patient Instructions (Signed)
We have printed the prescription for Percocet for you to take to your pharmacy.   You have been scheduled for a HIDA scan at [facility] (1st floor) on 12/09/14. Please arrive 30 minutes prior to your scheduled appointment at  92:11HE. Make certain not to have anything to eat or drink at least 6 hours prior to your test. Should this appointment date or time not work well for you, please call radiology scheduling at [phone].  _____________________________________________________________________ hepatobiliary (HIDA) scan is an imaging procedure used to diagnose problems in the liver, gallbladder and bile ducts. In the HIDA scan, a radioactive chemical or tracer is injected into a vein in your arm. The tracer is handled by the liver like bile. Bile is a fluid produced and excreted by your liver that helps your digestive system break down fats in the foods you eat. Bile is stored in your gallbladder and the gallbladder releases the bile when you eat a meal. A special nuclear medicine scanner (gamma camera) tracks the flow of the tracer from your liver into your gallbladder and small intestine.  During your HIDA scan  You'll be asked to change into a hospital gown before your HIDA scan begins. Your health care team will position you on a table, usually on your back. The radioactive tracer is then injected into a vein in your arm.The tracer travels through your bloodstream to your liver, where it's taken up by the bile-producing cells. The radioactive tracer travels with the bile from your liver into your gallbladder and through your bile ducts to your small intestine.You may feel some pressure while the radioactive tracer is injected into your vein. As you lie on the table, a special gamma camera is positioned over your abdomen taking pictures of the tracer as it moves through your body. The gamma camera takes pictures continually for about an hour. You'll need to keep still during the HIDA scan. This  can become uncomfortable, but you may find that you can lessen the discomfort by taking deep breaths and thinking about other things. Tell your health care team if you're uncomfortable. The radiologist will watch on a computer the progress of the radioactive tracer through your body. The HIDA scan may be stopped when the radioactive tracer is seen in the gallbladder and enters your small intestine. This typically takes about an hour. In some cases extra imaging will be performed if original images aren't satisfactory, if morphine is given to help visualize the gallbladder or if the medication CCK is given to look at the contraction of the gallbladder. This test typically takes 2 hours to complete. ________________________________________________________________________

## 2014-12-03 ENCOUNTER — Telehealth: Payer: Self-pay | Admitting: Physician Assistant

## 2014-12-03 NOTE — Telephone Encounter (Signed)
Patient notified that per OV note, no more pain medications will be prescribed by this office.

## 2014-12-09 ENCOUNTER — Ambulatory Visit (HOSPITAL_COMMUNITY)
Admission: RE | Admit: 2014-12-09 | Discharge: 2014-12-09 | Disposition: A | Discharge status: Home / Self Care | Payer: BLUE CROSS/BLUE SHIELD | Source: Ambulatory Visit | Attending: Physician Assistant | Admitting: Physician Assistant

## 2014-12-09 DIAGNOSIS — R1011 Right upper quadrant pain: Secondary | ICD-10-CM | POA: Diagnosis not present

## 2014-12-09 MED ORDER — SINCALIDE 5 MCG IJ SOLR
0.0200 ug/kg | Freq: Once | INTRAMUSCULAR | Status: AC
  Administered 2014-12-09: 1.5 ug via INTRAVENOUS

## 2014-12-09 MED ORDER — SINCALIDE 5 MCG IJ SOLR
INTRAMUSCULAR | Status: AC
  Administered 2014-12-09: 1.5 ug via INTRAVENOUS
  Filled 2014-12-09: qty 5

## 2014-12-09 MED ORDER — TECHNETIUM TC 99M MEBROFENIN IV KIT
5.0000 | PACK | Freq: Once | INTRAVENOUS | Status: DC | PRN
  Administered 2014-12-09: 5.3 via INTRAVENOUS
  Filled 2014-12-09: qty 6

## 2014-12-09 MED ORDER — STERILE WATER FOR INJECTION IJ SOLN
INTRAMUSCULAR | Status: AC
  Administered 2014-12-09: 5 mL
  Filled 2014-12-09: qty 10

## 2014-12-10 ENCOUNTER — Telehealth: Payer: Self-pay | Admitting: Physician Assistant

## 2014-12-10 NOTE — Telephone Encounter (Signed)
Spoke with patient and gave him results again.

## 2015-01-02 ENCOUNTER — Emergency Department (HOSPITAL_COMMUNITY): Payer: BLUE CROSS/BLUE SHIELD

## 2015-01-02 ENCOUNTER — Emergency Department (HOSPITAL_COMMUNITY)
Admission: EM | Admit: 2015-01-02 | Discharge: 2015-01-02 | Disposition: A | Discharge status: Home / Self Care | Payer: BLUE CROSS/BLUE SHIELD | Attending: Emergency Medicine | Admitting: Emergency Medicine

## 2015-01-02 ENCOUNTER — Encounter (HOSPITAL_COMMUNITY): Payer: Self-pay

## 2015-01-02 DIAGNOSIS — R1011 Right upper quadrant pain: Secondary | ICD-10-CM | POA: Insufficient documentation

## 2015-01-02 DIAGNOSIS — Z85850 Personal history of malignant neoplasm of thyroid: Secondary | ICD-10-CM | POA: Insufficient documentation

## 2015-01-02 DIAGNOSIS — G8918 Other acute postprocedural pain: Secondary | ICD-10-CM | POA: Insufficient documentation

## 2015-01-02 DIAGNOSIS — Z79899 Other long term (current) drug therapy: Secondary | ICD-10-CM | POA: Diagnosis not present

## 2015-01-02 DIAGNOSIS — Z86010 Personal history of colonic polyps: Secondary | ICD-10-CM | POA: Insufficient documentation

## 2015-01-02 DIAGNOSIS — G8929 Other chronic pain: Secondary | ICD-10-CM | POA: Insufficient documentation

## 2015-01-02 DIAGNOSIS — Z720 Tobacco use: Secondary | ICD-10-CM | POA: Insufficient documentation

## 2015-01-02 DIAGNOSIS — R509 Fever, unspecified: Secondary | ICD-10-CM | POA: Diagnosis not present

## 2015-01-02 DIAGNOSIS — Z9049 Acquired absence of other specified parts of digestive tract: Secondary | ICD-10-CM | POA: Diagnosis not present

## 2015-01-02 DIAGNOSIS — R110 Nausea: Secondary | ICD-10-CM | POA: Diagnosis not present

## 2015-01-02 LAB — URINALYSIS, ROUTINE W REFLEX MICROSCOPIC
Bilirubin Urine: NEGATIVE
GLUCOSE, UA: NEGATIVE mg/dL
HGB URINE DIPSTICK: NEGATIVE
KETONES UR: NEGATIVE mg/dL
LEUKOCYTES UA: NEGATIVE
Nitrite: NEGATIVE
PROTEIN: NEGATIVE mg/dL
Specific Gravity, Urine: 1.017 (ref 1.005–1.030)
UROBILINOGEN UA: 0.2 mg/dL (ref 0.0–1.0)
pH: 7 (ref 5.0–8.0)

## 2015-01-02 LAB — COMPREHENSIVE METABOLIC PANEL
ALK PHOS: 99 U/L (ref 38–126)
ALT: 18 U/L (ref 17–63)
ANION GAP: 8 (ref 5–15)
AST: 16 U/L (ref 15–41)
Albumin: 4.3 g/dL (ref 3.5–5.0)
BUN: 17 mg/dL (ref 6–20)
CALCIUM: 8.8 mg/dL — AB (ref 8.9–10.3)
CO2: 25 mmol/L (ref 22–32)
CREATININE: 1.08 mg/dL (ref 0.61–1.24)
Chloride: 103 mmol/L (ref 101–111)
GFR calc Af Amer: >60 mL/min (ref >60)
Glucose, Bld: 103 mg/dL — ABNORMAL HIGH (ref 65–99)
Potassium: 4.1 mmol/L (ref 3.5–5.1)
Sodium: 136 mmol/L (ref 135–145)
Total Bilirubin: 0.5 mg/dL (ref 0.3–1.2)
Total Protein: 7.7 g/dL (ref 6.5–8.1)

## 2015-01-02 LAB — CBC
HCT: 42.2 % (ref 39.0–52.0)
Hemoglobin: 14.5 g/dL (ref 13.0–17.0)
MCH: 30.6 pg (ref 26.0–34.0)
MCHC: 34.4 g/dL (ref 30.0–36.0)
MCV: 89 fL (ref 78.0–100.0)
PLATELETS: 334 10*3/uL (ref 150–400)
RBC: 4.74 MIL/uL (ref 4.22–5.81)
RDW: 13.7 % (ref 11.5–15.5)
WBC: 10.6 10*3/uL — ABNORMAL HIGH (ref 4.0–10.5)

## 2015-01-02 LAB — LIPASE, BLOOD: Lipase: 13 U/L — ABNORMAL LOW (ref 22–51)

## 2015-01-02 MED ORDER — IOHEXOL 300 MG/ML  SOLN
100.0000 mL | Freq: Once | INTRAMUSCULAR | Status: AC | PRN
  Administered 2015-01-02: 100 mL via INTRAVENOUS

## 2015-01-02 MED ORDER — HYDROMORPHONE HCL 1 MG/ML IJ SOLN
1.0000 mg | Freq: Once | INTRAMUSCULAR | Status: AC
  Administered 2015-01-02: 1 mg via INTRAVENOUS
  Filled 2015-01-02: qty 1

## 2015-01-02 MED ORDER — IOHEXOL 300 MG/ML  SOLN
25.0000 mL | Freq: Once | INTRAMUSCULAR | Status: AC | PRN
  Administered 2015-01-02: 25 mL via ORAL

## 2015-01-02 MED ORDER — OXYCODONE-ACETAMINOPHEN 5-325 MG PO TABS: 1.0000 | ORAL_TABLET | Freq: Four times a day (QID) | ORAL | Status: DC | PRN

## 2015-01-02 NOTE — ED Provider Notes (Signed)
CSN: [id number]     Arrival date & time 01/02/15  1058 History   First MD Initiated Contact with Patient 01/02/15 1243     Chief Complaint  Patient presents with  . Abdominal Pain  . Fever  . Post-op Problem     (Consider location/radiation/quality/duration/timing/severity/associated sxs/prior Treatment) Patient is a 64 y.o. male presenting with abdominal pain.  Abdominal Pain Pain location:  RUQ Pain quality: aching and sharp   Pain radiates to:  Does not radiate Pain severity:  Severe Onset quality:  Sudden Duration:  7 days Timing:  Constant Progression:  Unchanged Chronicity:  New Context comment:  1 week s/p cholecystectomy.  done in [place], [place] Relieved by: percocet provides mild relief. Worsened by:  Nothing tried Associated symptoms: fever (subjective, once, yesterday) and nausea   Associated symptoms: no constipation, no diarrhea, no shortness of breath and no vomiting     Past Medical History  Diagnosis Date  . Thyroid cancer   . Chronic abdominal pain   . Colon polyps    Past Surgical History  Procedure Laterality Date  . Thyroidectomy      2004/2005  . Colonoscopy    . Cholecystectomy     Family History  Problem Relation Age of Onset  . Kidney cancer Father   . Hypertension Mother   . Hypertension Father   . Hyperthyroidism Brother   . Thyroid cancer Maternal Uncle    Social History  Substance Use Topics  . Smoking status: Current Every Day Smoker    Types: Cigarettes  . Smokeless tobacco: Never Used  . Alcohol Use: 0.0 oz/week    0 Standard drinks or equivalent per week    Review of Systems  Constitutional: Positive for fever (subjective, once, yesterday).  Respiratory: Negative for shortness of breath.   Gastrointestinal: Positive for nausea and abdominal pain. Negative for vomiting, diarrhea and constipation.  All other systems reviewed and are negative.     Allergies  Morphine and related  Home Medications   Prior to  Admission medications   Medication Sig Start Date End Date Taking? Authorizing Provider  amphetamine-dextroamphetamine (ADDERALL) 20 MG tablet Take 20 mg by mouth 2 (two) times daily as needed (mental concentration).     Historical Provider, MD  levothyroxine (SYNTHROID, LEVOTHROID) 200 MCG tablet Take 200 mcg by mouth daily before breakfast. Takes with 25  mcg to equal 225 mcg    Historical Provider, MD  levothyroxine (SYNTHROID, LEVOTHROID) 25 MCG tablet Take 25 mcg by mouth daily before breakfast. Takes with 200 mcg to equal 225 mcg    Historical Provider, MD  mirtazapine (REMERON) 45 MG tablet Take 45 mg by mouth at bedtime.    Historical Provider, MD  omeprazole (PRILOSEC) 40 MG capsule Take 40 mg by mouth 2 (two) times daily.    Historical Provider, MD  oxyCODONE-acetaminophen (PERCOCET) 10-325 MG per tablet Take 1 tablet by mouth every 8 (eight) hours as needed for pain. 11/27/14   Lori P Hvozdovic, PA-C  rosuvastatin (CRESTOR) 20 MG tablet Take 20 mg by mouth daily.    Historical Provider, MD   BP 113/85 mmHg  Pulse 88  Temp(Src) 98.2 F (36.8 C) (Oral)  Resp 20  SpO2 96% Physical Exam  Constitutional: He is oriented to person, place, and time. He appears well-developed and well-nourished. No distress.  HENT:  Head: Normocephalic and atraumatic.  Mouth/Throat: Oropharynx is clear and moist.  Eyes: Conjunctivae are normal. Pupils are equal, round, and reactive to light. No scleral icterus.  Neck: Neck supple.  Cardiovascular: Normal rate, regular rhythm, normal heart sounds and intact distal pulses.   No murmur heard. Pulmonary/Chest: Effort normal and breath sounds normal. No stridor. No respiratory distress. He has no wheezes. He has no rales.  Abdominal: Soft. He exhibits no distension. There is tenderness (diffusely). There is no rigidity and no guarding.  Multiple small surgical incisions are all well healing without erythema or purulence.  Musculoskeletal: Normal range of  motion. He exhibits no edema.  Neurological: He is alert and oriented to person, place, and time.  Skin: Skin is warm and dry. No rash noted.  Psychiatric: He has a normal mood and affect. His behavior is normal.  Nursing note and vitals reviewed.   ED Course  Procedures (including critical care time) Labs Review Labs Reviewed  LIPASE, BLOOD - Abnormal; Notable for the following:    Lipase 13 (*)    All other components within normal limits  COMPREHENSIVE METABOLIC PANEL - Abnormal; Notable for the following:    Glucose, Bld 103 (*)    Calcium 8.8 (*)    All other components within normal limits  CBC - Abnormal; Notable for the following:    WBC 10.6 (*)    All other components within normal limits  URINALYSIS, ROUTINE W REFLEX MICROSCOPIC (NOT AT [facility])    Imaging Review Ct Abdomen Pelvis W Contrast  01/02/2015   CLINICAL DATA:  Status post cholecystectomy 1 week ago. Right upper quadrant abdominal pain.  EXAM: CT ABDOMEN AND PELVIS WITH CONTRAST  TECHNIQUE: Multidetector CT imaging of the abdomen and pelvis was performed using the standard protocol following bolus administration of intravenous contrast.  CONTRAST:  76mL OMNIPAQUE IOHEXOL 300 MG/ML SOLN, 135mL OMNIPAQUE IOHEXOL 300 MG/ML SOLN  COMPARISON:  CT scan 11/08/2014.  FINDINGS: Lower chest: The lung bases demonstrate dependent bibasilar atelectasis. No pulmonary lesions or pleural effusion. The heart is normal in size. No pericardial effusion. The distal esophagus is grossly normal. There is a small hiatal hernia.  Hepatobiliary: Multiple stable hepatic hemangiomas. No worrisome hepatic lesions or intrahepatic biliary dilatation. The gallbladder is surgically absent. Minimal residual postoperative changes in the gallbladder fossa but no findings to suggest a bile leak.  Pancreas: No mass, inflammation or ductal dilatation.  Spleen: Normal size.  No focal lesions.  Adrenals/Urinary Tract: The adrenal glands are normal.  No renal  abnormalities.  Stomach/Bowel: The stomach, duodenum, small bowel and colon are unremarkable. No inflammatory changes, mass lesions or obstructive findings. The appendix is normal. Moderate sigmoid diverticulosis without findings for acute diverticulitis.  Vascular/Lymphatic: No mesenteric or retroperitoneal mass or adenopathy. Scattered lymph nodes are noted. Moderate atherosclerotic calcifications involving the aorta no aneurysm or dissection.  Other: The bladder, prostate gland and seminal vesicles are unremarkable. No pelvic mass or adenopathy. No free pelvic fluid collections. No inguinal mass or adenopathy.  Musculoskeletal: No significant bony findings.  IMPRESSION: 1. Postoperative changes from recent cholecystectomy. No complicating features such as abscess, hematoma or biloma. 2. Numerous stable hepatic hemangiomas. 3. Bibasilar lung atelectasis.   Electronically Signed   By: Marijo Sanes M.D.   On: 01/02/2015 15:29   I have personally reviewed these lab results as part of my medical decision-making.   EKG Interpretation None      MDM   Final diagnoses:  Post-operative pain    CT obtained to exclude post operative complication and it was unremarkable.  Pain controlled.  Remained stable and well appearing.  DC'd with followup with his surgeon.  Return precautions given.   Serita Grit, MD 01/02/15 [phone]

## 2015-01-02 NOTE — ED Notes (Signed)
Pt c/o RUQ abdominal pain chills x 1 week.  Pain score 8/10.  Pt reports having gallbladder removed x 1 week ago.  Pt reports taking ibuprofen and prescribed pain medication w/o relief.  Sts he is supposed to have surgical follow-up tomorrow.

## 2015-01-02 NOTE — ED Notes (Signed)
Patient transported to CT 

## 2015-01-02 NOTE — ED Notes (Signed)
Pt escorted to discharge window. Pt verbalized understanding discharge instructions. In no acute distress.  

## 2015-04-25 DIAGNOSIS — Z6825 Body mass index (BMI) 25.0-25.9, adult: Secondary | ICD-10-CM | POA: Insufficient documentation

## 2016-05-20 ENCOUNTER — Telehealth (INDEPENDENT_AMBULATORY_CARE_PROVIDER_SITE_OTHER): Payer: Self-pay | Admitting: Orthopaedic Surgery

## 2016-05-20 NOTE — Telephone Encounter (Signed)
Patients is requesting any and all xrays. He needs them mailed to him at [facility].  [phone].[place] Master [phone].

## 2016-05-20 NOTE — Telephone Encounter (Signed)
CD has been made and placed in mail.  Patient has been notified.

## 2016-05-24 NOTE — Telephone Encounter (Signed)
Patient also needing all his records.  He is asking that they be emailed to him as he needs them as soon as possible.  I verified his dob and ssn.  I emailed his [facility] records to him [email or web address].

## 2017-01-03 DIAGNOSIS — L989 Disorder of the skin and subcutaneous tissue, unspecified: Secondary | ICD-10-CM | POA: Insufficient documentation

## 2017-01-16 IMAGING — NM NM HEPATO W/GB/PHARM/[PERSON_NAME]
2 series · 12 of 12 positions shown · non-contrast
Comparison: Abdominal and pelvic CT scan November 08, 2014 and
abdominal ultrasound October 03, 2014.

CLINICAL DATA: Five year history of daily abdominal pain, each
episode lasting 5-10 minutes, increasing in frequency and duration

EXAM:
NUCLEAR MEDICINE HEPATOBILIARY IMAGING WITH GALLBLADDER EF
TECHNIQUE: Sequential images of the abdomen were obtained [DATE] minutes
following intravenous administration of radiopharmaceutical. After
slow intravenous infusion of 1.51 micrograms Cholecystokinin,
gallbladder ejection fraction was determined.
RADIOPHARMACEUTICALS:  5.3 mCi Ic-ZZm Choletec IV

[he hepatobiliary · 3.43mm/px · 6 of 60 frames shown (1 of 2)]
[frame 6/60]
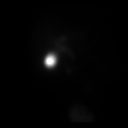
[frame 16/60]
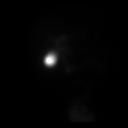
[frame 26/60]
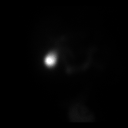
[frame 36/60]
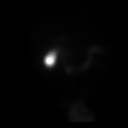
[frame 46/60]
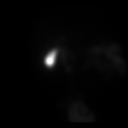
[frame 56/60]
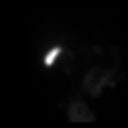

[he hepatobiliary · 3.43mm/px · 6 of 47 frames shown (2 of 2)]
[frame 4/47]
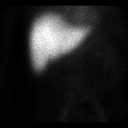
[frame 12/47]
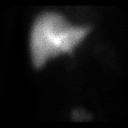
[frame 20/47]
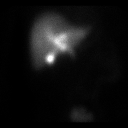
[frame 28/47]
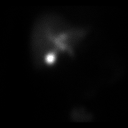
[frame 36/47]
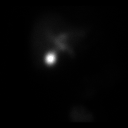
[frame 44/47]
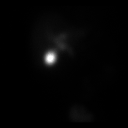

[12 of 12 positions shown; findings below may reference images not displayed]

FINDINGS: There is adequate uptake of the radiopharmaceutical by the liver.
The common bile duct is visible by 15 minutes and the gallbladder by
20 minutes. Faint bowel activity is visible shortly there after.
Following CCK administration the gallbladder ejection fraction is
only 36% at 50 minutes. The patient reported no symptoms with CCK
administration.. At 45 min, normal ejection fraction is greater than
40%.
IMPRESSION: There is no evidence of cystic duct or common bile duct obstruction.
However, there is low gallbladder ejection fraction of only 36%
consistent with gallbladder dysfunction.

## 2017-02-14 DIAGNOSIS — Z23 Encounter for immunization: Secondary | ICD-10-CM

## 2017-02-14 DIAGNOSIS — Z808 Family history of malignant neoplasm of other organs or systems: Secondary | ICD-10-CM

## 2017-02-14 DIAGNOSIS — Z885 Allergy status to narcotic agent status: Secondary | ICD-10-CM

## 2017-02-14 DIAGNOSIS — F909 Attention-deficit hyperactivity disorder, unspecified type: Secondary | ICD-10-CM | POA: Diagnosis present

## 2017-02-14 DIAGNOSIS — Z86010 Personal history of colonic polyps: Secondary | ICD-10-CM

## 2017-02-14 DIAGNOSIS — F419 Anxiety disorder, unspecified: Secondary | ICD-10-CM | POA: Diagnosis present

## 2017-02-14 DIAGNOSIS — E890 Postprocedural hypothyroidism: Secondary | ICD-10-CM | POA: Diagnosis present

## 2017-02-14 DIAGNOSIS — Z85850 Personal history of malignant neoplasm of thyroid: Secondary | ICD-10-CM

## 2017-02-14 DIAGNOSIS — K219 Gastro-esophageal reflux disease without esophagitis: Secondary | ICD-10-CM | POA: Diagnosis present

## 2017-02-14 DIAGNOSIS — Z79899 Other long term (current) drug therapy: Secondary | ICD-10-CM

## 2017-02-14 DIAGNOSIS — F17210 Nicotine dependence, cigarettes, uncomplicated: Secondary | ICD-10-CM | POA: Diagnosis present

## 2017-02-14 DIAGNOSIS — G894 Chronic pain syndrome: Secondary | ICD-10-CM | POA: Diagnosis present

## 2017-02-14 DIAGNOSIS — Z79890 Hormone replacement therapy (postmenopausal): Secondary | ICD-10-CM

## 2017-02-14 DIAGNOSIS — F332 Major depressive disorder, recurrent severe without psychotic features: Principal | ICD-10-CM | POA: Diagnosis present

## 2017-02-14 DIAGNOSIS — G4700 Insomnia, unspecified: Secondary | ICD-10-CM | POA: Diagnosis present

## 2017-02-14 DIAGNOSIS — R45851 Suicidal ideations: Secondary | ICD-10-CM | POA: Diagnosis present

## 2017-02-15 ENCOUNTER — Encounter (HOSPITAL_COMMUNITY): Payer: Self-pay

## 2017-02-15 ENCOUNTER — Inpatient Hospital Stay (HOSPITAL_COMMUNITY)
Admission: RE | Admit: 2017-02-15 | Discharge: 2017-02-17 | DRG: 885 | Disposition: A | Discharge status: Home / Self Care | Payer: Federal, State, Local not specified - Other | Attending: Psychiatry | Admitting: Psychiatry

## 2017-02-15 DIAGNOSIS — G894 Chronic pain syndrome: Secondary | ICD-10-CM | POA: Diagnosis present

## 2017-02-15 DIAGNOSIS — Z23 Encounter for immunization: Secondary | ICD-10-CM | POA: Diagnosis not present

## 2017-02-15 DIAGNOSIS — F909 Attention-deficit hyperactivity disorder, unspecified type: Secondary | ICD-10-CM | POA: Diagnosis present

## 2017-02-15 DIAGNOSIS — E890 Postprocedural hypothyroidism: Secondary | ICD-10-CM | POA: Diagnosis present

## 2017-02-15 DIAGNOSIS — F332 Major depressive disorder, recurrent severe without psychotic features: Secondary | ICD-10-CM | POA: Diagnosis present

## 2017-02-15 DIAGNOSIS — F17210 Nicotine dependence, cigarettes, uncomplicated: Secondary | ICD-10-CM | POA: Diagnosis present

## 2017-02-15 DIAGNOSIS — Z808 Family history of malignant neoplasm of other organs or systems: Secondary | ICD-10-CM | POA: Diagnosis not present

## 2017-02-15 DIAGNOSIS — Z885 Allergy status to narcotic agent status: Secondary | ICD-10-CM | POA: Diagnosis not present

## 2017-02-15 DIAGNOSIS — Z86010 Personal history of colonic polyps: Secondary | ICD-10-CM | POA: Diagnosis not present

## 2017-02-15 DIAGNOSIS — Z85850 Personal history of malignant neoplasm of thyroid: Secondary | ICD-10-CM | POA: Diagnosis not present

## 2017-02-15 DIAGNOSIS — Z79890 Hormone replacement therapy (postmenopausal): Secondary | ICD-10-CM | POA: Diagnosis not present

## 2017-02-15 DIAGNOSIS — Z79899 Other long term (current) drug therapy: Secondary | ICD-10-CM | POA: Diagnosis not present

## 2017-02-15 DIAGNOSIS — K219 Gastro-esophageal reflux disease without esophagitis: Secondary | ICD-10-CM | POA: Diagnosis present

## 2017-02-15 DIAGNOSIS — R45851 Suicidal ideations: Secondary | ICD-10-CM | POA: Diagnosis present

## 2017-02-15 DIAGNOSIS — F419 Anxiety disorder, unspecified: Secondary | ICD-10-CM | POA: Diagnosis present

## 2017-02-15 DIAGNOSIS — G4700 Insomnia, unspecified: Secondary | ICD-10-CM | POA: Diagnosis present

## 2017-02-15 LAB — COMPREHENSIVE METABOLIC PANEL
ALBUMIN: 3.8 g/dL (ref 3.5–5.0)
ALK PHOS: 83 U/L (ref 38–126)
ALT: 13 U/L — ABNORMAL LOW (ref 17–63)
ANION GAP: 9 (ref 5–15)
AST: 16 U/L (ref 15–41)
BILIRUBIN TOTAL: 0.4 mg/dL (ref 0.3–1.2)
BUN: 18 mg/dL (ref 6–20)
CALCIUM: 8.8 mg/dL — AB (ref 8.9–10.3)
CO2: 27 mmol/L (ref 22–32)
Chloride: 103 mmol/L (ref 101–111)
Creatinine, Ser: 0.99 mg/dL (ref 0.61–1.24)
GFR calc Af Amer: >60 mL/min (ref >60)
GLUCOSE: 118 mg/dL — AB (ref 65–99)
Potassium: 3.7 mmol/L (ref 3.5–5.1)
Sodium: 139 mmol/L (ref 135–145)
TOTAL PROTEIN: 6.8 g/dL (ref 6.5–8.1)

## 2017-02-15 LAB — CBC
HEMATOCRIT: 37.9 % — AB (ref 39.0–52.0)
HEMOGLOBIN: 12.9 g/dL — AB (ref 13.0–17.0)
MCH: 30.9 pg (ref 26.0–34.0)
MCHC: 34 g/dL (ref 30.0–36.0)
MCV: 90.7 fL (ref 78.0–100.0)
Platelets: 337 10*3/uL (ref 150–400)
RBC: 4.18 MIL/uL — ABNORMAL LOW (ref 4.22–5.81)
RDW: 14 % (ref 11.5–15.5)
WBC: 11.7 10*3/uL — ABNORMAL HIGH (ref 4.0–10.5)

## 2017-02-15 LAB — LIPID PANEL
CHOL/HDL RATIO: 6 ratio
Cholesterol: 269 mg/dL — ABNORMAL HIGH (ref 0–200)
HDL: 45 mg/dL (ref >40)
LDL Cholesterol: 192 mg/dL — ABNORMAL HIGH (ref 0–99)
Triglycerides: 160 mg/dL — ABNORMAL HIGH (ref <150)
VLDL: 32 mg/dL (ref 0–40)

## 2017-02-15 LAB — HEPATIC FUNCTION PANEL
ALBUMIN: 3.8 g/dL (ref 3.5–5.0)
ALK PHOS: 86 U/L (ref 38–126)
ALT: 14 U/L — ABNORMAL LOW (ref 17–63)
AST: 17 U/L (ref 15–41)
Bilirubin, Direct: <0.1 mg/dL — ABNORMAL LOW (ref 0.1–0.5)
TOTAL PROTEIN: 6.8 g/dL (ref 6.5–8.1)
Total Bilirubin: 0.4 mg/dL (ref 0.3–1.2)

## 2017-02-15 LAB — RAPID URINE DRUG SCREEN, HOSP PERFORMED
Amphetamines: POSITIVE — AB
BARBITURATES: NOT DETECTED
Benzodiazepines: NOT DETECTED
Cocaine: NOT DETECTED
Opiates: POSITIVE — AB
Tetrahydrocannabinol: POSITIVE — AB

## 2017-02-15 LAB — ETHANOL: Alcohol, Ethyl (B): <10 mg/dL (ref <10)

## 2017-02-15 LAB — TSH: TSH: 88.913 u[IU]/mL — AB (ref 0.350–4.500)

## 2017-02-15 LAB — HEMOGLOBIN A1C
HEMOGLOBIN A1C: 6.4 % — AB (ref 4.8–5.6)
Mean Plasma Glucose: 136.98 mg/dL

## 2017-02-15 LAB — MAGNESIUM: MAGNESIUM: 2.1 mg/dL (ref 1.7–2.4)

## 2017-02-15 MED ORDER — ROSUVASTATIN CALCIUM 10 MG PO TABS
10.0000 mg | ORAL_TABLET | Freq: Every day | ORAL | Status: DC
  Administered 2017-02-15 – 2017-02-17 (×3): 10 mg via ORAL
  Filled 2017-02-15 (×5): qty 1

## 2017-02-15 MED ORDER — PANTOPRAZOLE SODIUM 40 MG PO TBEC
40.0000 mg | DELAYED_RELEASE_TABLET | Freq: Every day | ORAL | Status: DC
  Administered 2017-02-15 – 2017-02-17 (×3): 40 mg via ORAL
  Filled 2017-02-15 (×6): qty 1

## 2017-02-15 MED ORDER — MIRTAZAPINE 45 MG PO TABS
45.0000 mg | ORAL_TABLET | Freq: Every day | ORAL | Status: DC
  Administered 2017-02-15: 45 mg via ORAL
  Filled 2017-02-15 (×2): qty 1

## 2017-02-15 MED ORDER — ALUM & MAG HYDROXIDE-SIMETH 200-200-20 MG/5ML PO SUSP: 30.0000 mL | ORAL | Status: DC | PRN

## 2017-02-15 MED ORDER — LEVOTHYROXINE SODIUM 200 MCG PO TABS
200.0000 ug | ORAL_TABLET | Freq: Every day | ORAL | Status: DC
  Administered 2017-02-15 – 2017-02-17 (×3): 200 ug via ORAL
  Filled 2017-02-15 (×2): qty 1
  Filled 2017-02-15: qty 2
  Filled 2017-02-15 (×2): qty 1
  Filled 2017-02-15: qty 2
  Filled 2017-02-15: qty 1

## 2017-02-15 MED ORDER — TRAZODONE HCL 50 MG PO TABS
50.0000 mg | ORAL_TABLET | Freq: Every evening | ORAL | Status: DC | PRN
  Filled 2017-02-15: qty 1

## 2017-02-15 MED ORDER — IBUPROFEN 800 MG PO TABS: 800.0000 mg | ORAL_TABLET | Freq: Four times a day (QID) | ORAL | Status: DC | PRN

## 2017-02-15 MED ORDER — NICOTINE POLACRILEX 2 MG MT GUM
2.0000 mg | CHEWING_GUM | OROMUCOSAL | Status: DC | PRN
  Administered 2017-02-15 – 2017-02-17 (×7): 2 mg via ORAL
  Filled 2017-02-15 (×2): qty 1

## 2017-02-15 MED ORDER — HYDROXYZINE HCL 25 MG PO TABS
25.0000 mg | ORAL_TABLET | Freq: Four times a day (QID) | ORAL | Status: DC | PRN
  Administered 2017-02-16: 25 mg via ORAL
  Filled 2017-02-15: qty 1

## 2017-02-15 MED ORDER — AMPHETAMINE-DEXTROAMPHETAMINE 10 MG PO TABS
10.0000 mg | ORAL_TABLET | Freq: Two times a day (BID) | ORAL | Status: DC
  Administered 2017-02-15: 10 mg via ORAL
  Filled 2017-02-15: qty 1

## 2017-02-15 MED ORDER — TRAZODONE HCL 100 MG PO TABS
100.0000 mg | ORAL_TABLET | Freq: Every evening | ORAL | Status: DC | PRN
  Administered 2017-02-15: 100 mg via ORAL
  Filled 2017-02-15 (×6): qty 1

## 2017-02-15 MED ORDER — ACETAMINOPHEN 325 MG PO TABS: 650.0000 mg | ORAL_TABLET | Freq: Four times a day (QID) | ORAL | Status: DC | PRN

## 2017-02-15 MED ORDER — OXYCODONE-ACETAMINOPHEN 5-325 MG PO TABS
1.0000 | ORAL_TABLET | Freq: Four times a day (QID) | ORAL | Status: DC | PRN
  Administered 2017-02-15 – 2017-02-16 (×6): 2 via ORAL
  Filled 2017-02-15 (×6): qty 2

## 2017-02-15 MED ORDER — MAGNESIUM HYDROXIDE 400 MG/5ML PO SUSP: 30.0000 mL | Freq: Every day | ORAL | Status: DC | PRN

## 2017-02-15 MED ORDER — INFLUENZA VAC SPLIT QUAD 0.5 ML IM SUSY
0.5000 mL | PREFILLED_SYRINGE | INTRAMUSCULAR | Status: AC
  Administered 2017-02-16: 0.5 mL via INTRAMUSCULAR
  Filled 2017-02-15: qty 0.5

## 2017-02-15 NOTE — B[facility] Group Notes (Signed)
Pt attended spiritual care group on grief and loss facilitated by chaplain Jerene Pitch   Group opened with brief discussion and psycho-social ed around grief and loss in relationships and in relation to self - identifying life patterns, circumstances, changes that cause losses. Established group norm of speaking from own life experience. Group goal of establishing open and affirming space for members to share loss and experience with grief, normalize grief experience and provide psycho social education and grief support.  Group engaged in facilitated discussion around experience of grief, discussed tasks of mourning with acknowledgement of where these fit into their own journey.   Group facilitation drew on Pastoral care, Adlerian, Narrative, and psycho-dynamic group theories.    Lee Mitchell was present at group introductions.  In speaking about grief around loss of relationships and self, he responded "growing old is a difficult thing"   He did not expand on this thought.  Lee Mitchell left room during group and did not return.   WL / Bunk Foss, MDiv

## 2017-02-15 NOTE — Tx Team (Signed)
Initial Treatment Plan 02/15/2017 3:39 AM [name] BBC:[id number]    PATIENT STRESSORS: Health problems Marital or family conflict Traumatic event   PATIENT STRENGTHS: Ability for insight Active sense of humor Average or above average intelligence General fund of knowledge Motivation for treatment/growth   PATIENT IDENTIFIED PROBLEMS: Risk for suicide  anxiety  depression  "want to find out what's going on with me, why I can't pursue things"               DISCHARGE CRITERIA:  Improved stabilization in mood, thinking, and/or behavior Verbal commitment to aftercare and medication compliance  PRELIMINARY DISCHARGE PLAN: Attend aftercare/continuing care group Outpatient therapy  PATIENT/FAMILY INVOLVEMENT: This treatment plan has been presented to and reviewed with the patient, Lee Mitchell.  The patient and family have been given the opportunity to ask questions and make suggestions.  Providence Crosby, RN 02/15/2017, 3:39 AM

## 2017-02-15 NOTE — Progress Notes (Signed)
Patient ID: Lee Mitchell, male   DOB: [birth year], 66 y.o.   MRN: [id number]  Admission Note:  D:66 yr male who presents VC in no acute distress for the treatment of SI and Depression. Pt appears flat and depressed. Pt was calm and cooperative with admission process. Pt presents with passive SI and contracts for safety upon admission. Pt denies AH, endorses passive VH- shadows . Pt stated he had increased feelings of SI x 2 weeks around the time his kids stopped talking to him without any explanation. Pt stated he had increased anxiety. Pt stated he has been having issues finding work since an incident in 2014 at the [place] [employer]. 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 Pt had thyroid Cancer/ surgery in 2005.    A:Skin was assessed and found to be clear of any abnormal marks apart from old surgical scars on neck, L shoulder, L ankle, bilateral elbows. PT searched and no contraband found, POC and unit policies explained and understanding verbalized. Consents obtained. Food and fluids offered, and accepted.  R: Pt had no additional questions or concerns.

## 2017-02-15 NOTE — H&P (Signed)
Behavioral Health Medical Screening Exam  Lee Mitchell is an 66 y.o. male, presents to [facility] as a walk-in. He is unaccompanied and arrived via POV. He is endorsing exacerbated depressive symptoms over the last few months, with passive SI, denying plan, intent or means, but he cannot contract for safety at this time. He is denying any pervasive illicit drug use, he uses cannabis on occasion. He is denying any hypo-mania, cutting, panic attacks, or psychotic features.He endorses a hx of thyroid Cancer, multiple orthopedic surgeries, but denies N/V, anorexia, failure to thrive, CO, DOE, near syncope Chest pains, AMS changes or hemiparesis/plegia  Total Time spent with patient: 20 minutes  Psychiatric Specialty Exam: Physical Exam  Constitutional: He is oriented to person, place, and time. He appears well-developed and well-nourished. No distress.  HENT:  Head: Normocephalic.  Eyes: Pupils are equal, round, and reactive to light.  Respiratory: Effort normal and breath sounds normal. No respiratory distress.  GI: Soft. Bowel sounds are normal. He exhibits no distension.  Neurological: He is alert and oriented to person, place, and time. No cranial nerve deficit.  Skin: Skin is warm and dry. He is not diaphoretic.  Psychiatric: His speech is normal. Judgment normal. He is slowed and withdrawn. He is not agitated. Cognition and memory are normal. He exhibits a depressed mood. He expresses suicidal ideation.    Review of Systems  Psychiatric/Behavioral: Positive for depression, substance abuse and suicidal ideas. Negative for hallucinations. The patient is nervous/anxious and has insomnia.   All other systems reviewed and are negative.   There were no vitals taken for this visit.There is no height or weight on file to calculate BMI.  General Appearance: Casual  Eye Contact:  Good  Speech:  Slow  Volume:  Decreased  Mood:  Depressed  Affect:  Congruent  Thought Process:  Goal Directed   Orientation:  Full (Time, Place, and Person)  Thought Content:  Logical  Suicidal Thoughts:  Yes.  without intent/plan  Homicidal Thoughts:  No  Memory:  Immediate;   Good  Judgement:  Fair  Insight:  Fair  Psychomotor Activity:  Negative  Concentration: Concentration: Good  Recall:  Good  Fund of Knowledge:Good  Language: Good  Akathisia:  Negative  Handed:  Right  AIMS (if indicated):     Assets:  Desire for Improvement  Sleep:       Musculoskeletal: Strength & Muscle Tone: within normal limits Gait & Station: normal Patient leans: N/A  There were no vitals taken for this visit.  Recommendations:  Based on my evaluation the patient does not appear to have an emergency medical condition.  Laverle Hobby, PA-C 02/15/2017, 1:25 AM

## 2017-02-15 NOTE — Progress Notes (Signed)
D: Pt presents with a flat affect and an anxious mood. Pt reports ongoing depression and SI for weeks. Pt stated that he's been feeling increasingly depressed and suicidal because his children are gone. Pt refused to elaborate on what he meant by "his children are going". Pt endorses passive SI. Pt denies active SI at this time and verbally contracts for safety. Pt stated I have thoughts of not wanting to live but I don't have a plan to kill myself. Writer reminder pt to turn in urine sample for UDS. Pt requested to have OxyContin ordered for pain. Darnelle Maffucci, NP., made aware. Pt given percocet prn at pt request for pain. A: Medications reviewed with pt. Medications administered as ordered per MD. Verbal support provided. Pt encouraged to attend groups. 15 minute checks performed for safety. R: Pt compliant with tx.
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
## 2017-02-15 NOTE — Plan of Care (Signed)
Problem: Activity: Goal: Interest or engagement in activities will improve Outcome: Not Progressing Pt has stayed in his room for the majority of the night.  He refused to attend evening group.  No peer interactions witnessed.

## 2017-02-15 NOTE — [facility] Suicide Risk Assessment (Signed)
[facility] Admission Suicide Risk Assessment   Nursing information obtained from:   patient and chart  Demographic factors:   Divorced, has two adult children, lives with a friend Current Mental Status:   see below  Loss Factors:   distanced from children, states efforts to become closer to them did not work well  Historical Factors:   no prior psychiatric admissions , no prior suicidal attempts, history of depression. Risk Reduction Factors:   resilience   Total Time spent with patient: 45 minutes Principal Problem: MDD, no psychotic symptoms Diagnosis:   Patient Active Problem List   Diagnosis Date Noted  . MDD (major depressive disorder), recurrent episode, severe ([place]) [F33.2] 02/15/2017  . RLQ abdominal pain [R10.31] 11/06/2014  . RUQ abdominal pain [R10.11] 11/06/2014  . Chronic abdominal pain [R10.9, G89.29]   . GERD [K21.9] 03/22/2010  . MUSCLE SPASM, BACK [M53.80] 03/22/2010  . SYNCOPE [R55] 03/22/2010  . THYROID CANCER, HX OF [Z85.850] 03/22/2010  . DEPRESSION, HX OF [Z86.59] 03/22/2010    Continued Clinical Symptoms:  Alcohol Use Disorder Identification Test Final Score (AUDIT): 2 The "Alcohol Use Disorders Identification Test", Guidelines for Use in Primary Care, Second Edition.  World Pharmacologist [facility]). Score between 0-7:  no or low risk or alcohol related problems. Score between 8-15:  moderate risk of alcohol related problems. Score between 16-19:  high risk of alcohol related problems. Score 20 or above:  warrants further diagnostic evaluation for alcohol dependence and treatment.   CLINICAL FACTORS:  66 year old divorced male, two adult children, reports he recently relocated from [facility] , states he is unsure if he is going to stay local or return to [facility].  He presented voluntarily to hospital due to worsening depression and passive suicidal ideations. Denies hallucinations. States that he has a history of depression, but denies prior psychiatric admissions .  Patient  reports he has a history of chronic pain, with multiple surgeries in the past.Describes mostly L shoulder and R elbow pain. He reports he is prescribed opiates x 20 + years .  Medical History remarkable for chronic pain, thyroid cancer . Dx- MDD, versus Depression secondary to [place] - Inpatient admission. States he has been on Remeron x 3 + years- continue Remeron 45 mgrs QHS.   At this time will not restart Adderall  Opiate management has been verified by staff via pharmacy - continue Oxycodone 5-10 mgrs Q 6 hours PRN for pain as needed      Musculoskeletal: Strength & Muscle Tone: within normal limits Gait & Station: normal Patient leans: N/A  Psychiatric Specialty Exam: Physical Exam  ROS denies chest pain, no dyspnea, no vomiting   Blood pressure 130/79, pulse 75, temperature 98 F (36.7 C), temperature source Oral, resp. rate 18, height 6\' 2"  (1.88 m), weight 78.9 kg (174 lb).Body mass index is 22.34 kg/m.  General Appearance: Fairly Groomed  Eye Contact:  Good  Speech:  Normal Rate  Volume:  Normal  Mood:  depressed, vaguely irritable   Affect:  Appropriate and vaguely irritable   Thought Process:  Linear and Descriptions of Associations: Intact  Orientation:  Other:  fully alert and attentive   Thought Content:  denies hallucinations, no delusions   Suicidal Thoughts:  No denies suicidal or self injurious ideations, denies homicidal ideations  Homicidal Thoughts:  No  Memory:  recent and remote grossly intact   Judgement:  Fair  Insight:  Fair  Psychomotor Activity:  Normal  Concentration:  Concentration: Good  and Attention Span: Good  Recall:  Good  Fund of Knowledge:  Good  Language:  Good  Akathisia:  No  Handed:  Right  AIMS (if indicated):     Assets:  Communication Skills Desire for Improvement Resilience  ADL's:  Intact  Cognition:  WNL  Sleep:  Number of Hours: 2.25      COGNITIVE FEATURES THAT CONTRIBUTE TO RISK:   Closed-mindedness and Loss of executive function    SUICIDE RISK:   Moderate:  Frequent suicidal ideation with limited intensity, and duration, some specificity in terms of plans, no associated intent, good self-control, limited dysphoria/symptomatology, some risk factors present, and identifiable protective factors, including available and accessible social support.  PLAN OF CARE: Patient will be admitted to inpatient psychiatric unit for stabilization and safety. Will provide and encourage milieu participation. Provide medication management and maked adjustments as needed.  Will follow daily.    I certify that inpatient services furnished can reasonably be expected to improve the patient's condition.   Jenne Campus, MD 02/15/2017, 8:29 AM

## 2017-02-15 NOTE — Plan of Care (Signed)
Problem: Safety: Goal: Periods of time without injury will increase Outcome: Progressing Pt remains free from self harm injuries. Pt denies SI. Pt verbally contracts to no self harm behaviors.

## 2017-02-15 NOTE — Progress Notes (Signed)
D: Pt was in bed in his room upon initial approach.  Pt presents with agitated, depressed affect and mood.  Pt describes his day as "lackluster."  His goal is to "figure out what's going on in my life, try to get educated by these people here that are supposed to be educated."  Pt denies SI/HI, denies hallucinations, reports chronic generalized pain of 9/10.  He reports he usually takes another pain medication in addition to the one he is prescribed at [facility].  Pt was encouraged to discuss this with provider and he reports that he did this today when he met with provider face to face.  He is irritable, demanding, argumentative, and entitled when interacting with staff.  Pt is medication-seeking.  He repeatedly came to medication window demanding his PRN pain medication.  When he requested it the first time, writer explained to pt that the medication is ordered every 6 hours as needed and the next time he could possibly get it is at 2152.  After coming back and demanding the medication again, he stated to writer "you don't know what you're doing.  You can't give it 2 minutes early?  Yeah, right."  He states "you must be new at this."  It was again explained to pt that the medication can't be administered more than once every 6 hours.  Pt then stated "I'm not going to answer any more questions, just give me the medication so I can go to bed and make sure nobody wakes me up."  Pt refused to attend evening group despite encouragement from staff.  He stayed in his room and did not interact with peers.  Pt was informed that another urine specimen is needed and provided with specimen cup.   A: Introduced self to pt.  Actively listened to pt and offered support and encouragement. Medication administered per order.  Attempted to provide medication education to pt.  Pt talked over Probation officer and refused education.  PRN medication administered for pain.  Q15 minute safety checks maintained.    R: Pt is safe on the unit.  Pt is  compliant with medications.  Pt verbally contracts for safety.  Will continue to monitor and assess.

## 2017-02-15 NOTE — BHH Group Notes (Signed)
LCSW Group Therapy Note  02/15/2017 1:15pm  Type of Therapy/Topic:  Group Therapy:  Feelings about Diagnosis  Participation Level:  None   Description of Group:   This group will allow patients to explore their thoughts and feelings about diagnoses they have received. Patients will be guided to explore their level of understanding and acceptance of these diagnoses. Facilitator will encourage patients to process their thoughts and feelings about the reactions of others to their diagnosis and will guide patients in identifying ways to discuss their diagnosis with significant others in their lives. This group will be process-oriented, with patients participating in exploration of their own experiences, giving and receiving support, and processing challenge from other group members.   Therapeutic Goals: 1. Patient will demonstrate understanding of diagnosis as evidenced by identifying two or more symptoms of the disorder 2. Patient will be able to express two feelings regarding the diagnosis 3. Patient will demonstrate their ability to communicate their needs through discussion and/or role play  Summary of Patient Progress: Pt did not participate in group discussion, however appeared to be attentive.     Therapeutic Modalities:   Cognitive Behavioral Therapy Brief Therapy Feelings Identification    Gladstone Lighter, LCSW 02/15/2017 3:52 PM
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
## 2017-02-15 NOTE — Progress Notes (Signed)
Recreation Therapy Notes  Animal-Assisted Activity (AAA) Program Checklist/Progress Notes Patient Eligibility Criteria Checklist & Daily Group note for Rec TxIntervention  Date: 10.30.2018 Time: 2:45pm Location: [facility] [employer]   AAA/T Program Assumption of Risk Form signed by Patient/ or Parent Legal Guardian Yes  Patient is free of allergies or sever asthma Yes  Patient reports no fear of animals Yes  Patient reports no history of cruelty to animals Yes  Patient understands his/her participation is voluntary Yes  Patient washes hands before animal contact Yes  Patient washes hands after animal contact Yes  Behavioral Response: Appropriate   Education:Hand Washing, Appropriate Animal Interaction   Education Outcome: Acknowledges education.   Clinical Observations/Feedback: Patient attended session and interacted appropriately with therapy dog and peers.   Laureen Ochs Ragna Kramlich, LRT/CTRS      Tosca Pletz L 02/15/2017 3:05 PM

## 2017-02-15 NOTE — BHH Suicide Risk Assessment (Signed)
[facility] INPATIENT:  Family/Significant Other Suicide Prevention Education  Suicide Prevention Education:  Patient Refusal for Family/Significant Other Suicide Prevention Education: The patient [place] has refused to provide written consent for family/significant other to be provided Family/Significant Other Suicide Prevention Education during admission and/or prior to discharge.  Physician notified.  Gladstone Lighter 02/15/2017, 11:52 AM

## 2017-02-16 LAB — PROLACTIN: Prolactin: 39.3 ng/mL — ABNORMAL HIGH (ref 4.0–15.2)

## 2017-02-16 MED ORDER — OXYCODONE HCL ER 10 MG PO T12A
10.0000 mg | EXTENDED_RELEASE_TABLET | Freq: Two times a day (BID) | ORAL | Status: DC
  Administered 2017-02-16 – 2017-02-17 (×2): 10 mg via ORAL
  Filled 2017-02-16 (×3): qty 1

## 2017-02-16 MED ORDER — OXYCODONE-ACETAMINOPHEN 5-325 MG PO TABS
1.0000 | ORAL_TABLET | Freq: Three times a day (TID) | ORAL | Status: DC | PRN
  Administered 2017-02-17: 2 via ORAL
  Administered 2017-02-17: 1 via ORAL
  Filled 2017-02-16: qty 2
  Filled 2017-02-16 (×3): qty 1
  Filled 2017-02-16: qty 2

## 2017-02-16 MED ORDER — MIRTAZAPINE 30 MG PO TABS
30.0000 mg | ORAL_TABLET | Freq: Every day | ORAL | Status: DC
  Filled 2017-02-16 (×2): qty 1

## 2017-02-16 MED ORDER — OXYCODONE-ACETAMINOPHEN 5-325 MG PO TABS: 1.0000 | ORAL_TABLET | Freq: Three times a day (TID) | ORAL | Status: DC | PRN

## 2017-02-16 MED ORDER — OXYCODONE-ACETAMINOPHEN 5-325 MG PO TABS: 1.0000 | ORAL_TABLET | Freq: Four times a day (QID) | ORAL | Status: DC | PRN

## 2017-02-16 MED ORDER — OXYCODONE-ACETAMINOPHEN 5-325 MG PO TABS: 1.0000 | ORAL_TABLET | Freq: Two times a day (BID) | ORAL | Status: DC | PRN

## 2017-02-16 MED ORDER — AMPHETAMINE-DEXTROAMPHETAMINE 10 MG PO TABS
20.0000 mg | ORAL_TABLET | Freq: Every day | ORAL | Status: DC
  Administered 2017-02-16 – 2017-02-17 (×2): 20 mg via ORAL
  Filled 2017-02-16 (×2): qty 2

## 2017-02-16 NOTE — Tx Team (Signed)
Interdisciplinary Treatment and Diagnostic Plan Update  02/16/2017 Time of Session: 9:30am Case [place] MRN: [id number]  Principal Diagnosis: MDD (major depressive disorder), recurrent episode, severe ([place])  Secondary Diagnoses: Principal Problem:   MDD (major depressive disorder), recurrent episode, severe ([place]) Active Problems:   THYROID CANCER, HX OF   Chronic pain syndrome   Current Medications:  Current Facility-Administered Medications  Medication Dose Route Frequency Provider Last Rate Last Dose  . acetaminophen (TYLENOL) tablet 650 mg  650 mg Oral Q6H PRN Laverle Hobby, PA-C      . alum & mag hydroxide-simeth (MAALOX/MYLANTA) 200-200-20 MG/5ML suspension 30 mL  30 mL Oral Q4H PRN Patriciaann Lee E, PA-C      . hydrOXYzine (ATARAX/VISTARIL) tablet 25 mg  25 mg Oral Q6H PRN Patriciaann Lee E, PA-C      . ibuprofen (ADVIL,MOTRIN) tablet 800 mg  800 mg Oral Q6H PRN Laverle Hobby, PA-C      . Influenza vac split quadrivalent PF (FLUARIX) injection 0.5 mL  0.5 mL Intramuscular Tomorrow-1000 [place], Fernando A, MD      . levothyroxine (SYNTHROID, LEVOTHROID) tablet 200 mcg  200 mcg Oral QAC breakfast Laverle Hobby, PA-C   200 mcg at 02/16/17 0754  . magnesium hydroxide (MILK OF MAGNESIA) suspension 30 mL  30 mL Oral Daily PRN Laverle Hobby, PA-C      . mirtazapine (REMERON) tablet 45 mg  45 mg Oral QHS Patriciaann [place] E, PA-C   45 mg at 02/15/17 2156  . nicotine polacrilex (NICORETTE) gum 2 mg  2 mg Oral PRN Derrill Center, NP   2 mg at 02/15/17 1817  . oxyCODONE-acetaminophen (PERCOCET/ROXICET) 5-325 MG per tablet 1-2 tablet  1-2 tablet Oral Q6H PRN Laverle Hobby, PA-C   2 tablet at 02/16/17 0407  . pantoprazole (PROTONIX) EC tablet 40 mg  40 mg Oral Daily Laverle Hobby, PA-C   40 mg at 02/16/17 0754  . rosuvastatin (CRESTOR) tablet 10 mg  10 mg Oral Daily Laverle Hobby, PA-C   10 mg at 02/16/17 0754  . traZODone (DESYREL) tablet 50 mg  50 mg Oral QHS PRN [place],  Lee Peer, MD        PTA Medications: Prescriptions Prior to Admission  Medication Sig Dispense Refill Last Dose  . amphetamine-dextroamphetamine (ADDERALL) 30 MG tablet Take 10-20 mg by mouth 2 (two) times daily.    Past Week at Unknown time  . levothyroxine (SYNTHROID, LEVOTHROID) 125 MCG tablet Take 125 mcg by mouth daily before breakfast.    Past Week at Unknown time  . mirtazapine (REMERON) 30 MG tablet Take 30 mg by mouth at bedtime.    Past Week at Unknown time  . omeprazole (PRILOSEC) 40 MG capsule Take 40 mg by mouth 2 (two) times daily.   Past Week at Unknown time  . oxyCODONE (OXY IR/ROXICODONE) 5 MG immediate release tablet Take 10 mg by mouth 2 (two) times daily as needed for severe pain.   Past Week at Unknown time  . oxyCODONE (ROXICODONE) 15 MG immediate release tablet Take 15 mg by mouth every 12 (twelve) hours.   Past Week at Unknown time  . rosuvastatin (CRESTOR) 10 MG tablet Take 10 mg by mouth daily.   Not Taking at Unknown time    Treatment Modalities: Medication Management, Group therapy, Case management,  1 to 1 session with clinician, Psychoeducation, Recreational therapy.  Patient Stressors: Health problems Marital or family conflict Traumatic event  Patient Strengths: Ability for  insight Active sense of humor Average or above average intelligence General fund of knowledge Motivation for treatment/growth  Physician Treatment Plan for Primary Diagnosis: MDD (major depressive disorder), recurrent episode, severe ([place]) Long Term Goal(s): Improvement in symptoms so as ready for discharge  Short Term Goals: Ability to verbalize feelings will improve Ability to disclose and discuss suicidal ideas Ability to identify and develop effective coping behaviors will improve Ability to maintain clinical measurements within normal limits will improve Compliance with prescribed medications will improve  Medication Management: Evaluate patient's response, side effects,  and tolerance of medication regimen.  Therapeutic Interventions: 1 to 1 sessions, Unit Group sessions and Medication administration.  Evaluation of Outcomes: Not Met  Physician Treatment Plan for Secondary Diagnosis: Principal Problem:   MDD (major depressive disorder), recurrent episode, severe ([place]) Active Problems:   THYROID CANCER, HX OF   Chronic pain syndrome   Long Term Goal(s): Improvement in symptoms so as ready for discharge  Short Term Goals: Ability to verbalize feelings will improve Ability to disclose and discuss suicidal ideas Ability to identify and develop effective coping behaviors will improve Ability to maintain clinical measurements within normal limits will improve Compliance with prescribed medications will improve  Medication Management: Evaluate patient's response, side effects, and tolerance of medication regimen.  Therapeutic Interventions: 1 to 1 sessions, Unit Group sessions and Medication administration.  Evaluation of Outcomes: Not Met   RN Treatment Plan for Primary Diagnosis: MDD (major depressive disorder), recurrent episode, severe ([place]) Long Term Goal(s): Knowledge of disease and therapeutic regimen to maintain health will improve  Short Term Goals: Ability to disclose and discuss suicidal ideas, Ability to identify and develop effective coping behaviors will improve and Compliance with prescribed medications will improve  Medication Management: RN will administer medications as ordered by provider, will assess and evaluate patient's response and provide education to patient for prescribed medication. RN will report any adverse and/or side effects to prescribing provider.  Therapeutic Interventions: 1 on 1 counseling sessions, Psychoeducation, Medication administration, Evaluate responses to treatment, Monitor vital signs and CBGs as ordered, Perform/monitor CIWA, COWS, AIMS and Fall Risk screenings as ordered, Perform wound care treatments as  ordered.  Evaluation of Outcomes: Not Met   LCSW Treatment Plan for Primary Diagnosis: MDD (major depressive disorder), recurrent episode, severe ([place]) Long Term Goal(s): Safe transition to appropriate next level of care at discharge, Engage patient in therapeutic group addressing interpersonal concerns.  Short Term Goals: Engage patient in aftercare planning with referrals and resources and Increase skills for wellness and recovery  Therapeutic Interventions: Assess for all discharge needs, 1 to 1 time with Social worker, Explore available resources and support systems, Assess for adequacy in community support network, Educate family and significant other(s) on suicide prevention, Complete Psychosocial Assessment, Interpersonal group therapy.  Evaluation of Outcomes: Not Met   Progress in Treatment: Attending groups: Pt is new to milieu, continuing to assess  Participating in groups: Pt is new to milieu, continuing to assess  Taking medication as prescribed: Yes, MD continues to assess for medication changes as needed Toleration medication: Yes, no side effects reported at this time Family/Significant other contact made: No, Pt declines Patient understands diagnosis: Continuing to assess Discussing patient identified problems/goals with staff: Yes Medical problems stabilized or resolved: Yes Denies suicidal/homicidal ideation: Yes Issues/concerns per patient self-inventory: None Other: N/A  New problem(s) identified: None identified at this time.   New Short Term/Long Term Goal(s): "get back on my feet; make a plan for discharge"  Discharge  Plan or Barriers: Pt plans to return to [place] and reports he has been seen at [facility] for counseling. CSW assessing for appropriate medication management referrals  Reason for Continuation of Hospitalization: Anxiety Depression Medication stabilization  Estimated Length of Stay: 3-5 days; est DC date 11/4  Attendees: Patient:   02/16/2017  8:16 AM  Physician: Dr. Parke Poisson, MD 02/16/2017  8:16 AM  Nursing: Miguel Dibble, RN 02/16/2017  8:16 AM  RN Care Manager: Lars Pinks, RN 02/16/2017  8:16 AM  Social Worker: Adriana Reams, LCSW 02/16/2017  8:16 AM  Recreational Therapist:  02/16/2017  8:16 AM  Other: Lindell Spar, NP; Marvia Pickles, NP 02/16/2017  8:16 AM  Other:  02/16/2017  8:16 AM  Other: 02/16/2017  8:16 AM    Scribe for Treatment Team: Gladstone Lighter, LCSW 02/16/2017 8:16 AM

## 2017-02-16 NOTE — BHH Group Notes (Signed)
[facility] Mental Health Association Group Therapy 02/16/2017 1:15pm  Type of Therapy: Mental Health Association Presentation  Pt did not attend, declined invitation.   Gladstone Lighter, LCSW 02/16/2017 4:43 PM

## 2017-02-16 NOTE — Plan of Care (Signed)
Problem: Activity: Goal: Interest or engagement in activities will improve Outcome: Not Progressing Pt is not engaged with peers or activities on the unit.

## 2017-02-16 NOTE — Progress Notes (Signed)
Brief Progress Note  S: This provider was requested to evaluate pt for second opinion regarding medication recommendations. Pt was admitted for worsening depression and SI without plan. He was not restarted on home medications including opiate analgesics and Adderall 30mg  BID. When pt was informed that he would not be restarted on those medications, he requested to be discharged. Upon meeting with the patient, he is irritable and states that he feels he "can't trust anyone here" because nobody talked to him about the medication changes. Discussed with patient that he was admitted yesterday and immediate decisions about his medications needed to be made at that time. Discussed with patient that home medications are not always restarted, and the inpatient hospital setting is typical used to optimize pts' medications. Discussed with patient that there is a concern using multiple controlled substances - particularly adderall - to address mood symptoms, and pt stated that nothing had worked for his mood symptoms until he tried adderall. Discussed with patient that a lower dose or taper off would likely be recommended if he was staying at [facility], and pt verbalized good understanding. Pt was reassured that we would work with him to optimize his medications in a safe manner, and pt was in agreement to discuss with his primary treatment team about he options for tapering off of Adderall. He had no further questions, comments, or concerns.  O: Vitals:   02/16/17 0700 02/16/17 0701  BP: 116/70 118/72  Pulse: 85 83  Resp:    Temp: 97.8 F (36.6 C)    MSE: fair grooming, good eye contact, cooperative, normal rate/tone/volume latency, mood is anxious and irritable, affect was irritable and mood congruent, thought content negative for SI/HI/AH/VH, thought process goal oriented, cognition intact grossly, memory intact grossly for immediate/recent/remote, insight poor, and judgment poor.   A:Major depressive  disorder P: - Continue inpatient hospitalization - Recommend tapering off of Adderall, starting with reduced dose today - Pt may be continued on outpatient previously prescribed pain regimen - All other treatment recommendations as per primary treatment team -

## 2017-02-16 NOTE — Progress Notes (Signed)
D:Pt has been irritable and demanding wanting pain medication. Pt refused his scheduled morning medications saying that he would take them "when I get all of my medications." He came back a little while later and took his scheduled medications. Pt's mood is labile and demanding. He is fixated on being arrested several years ago and says that it is why he can not get a job.  A:Offered support and 15 minute checks. Gave medications as ordered. R:Pt denies si and hi. Safety maintained on the unit.

## 2017-02-16 NOTE — Progress Notes (Addendum)
[facility] MD Progress Note  02/16/2017 1:40 PM Lee Mitchell  MRN:  [id number] Subjective:  Patient is currently focused on chronic pain and medication issues . Reports being on opiates for years, without side effects. States he has been on Adderall for years, with good response and no side effects. States that with it he has been much better able to function than off it. Objective : I have discussed case with treatment team and have met with patient. As per staff , patient has presented dysphoric, irritable ,medication focused. No disruptive or agitated behaviors on unit. During our session patient remained focused on medication issues- explains  that he has been on opiates for many years, denies abusing , and feels  that his level of functioning is improved due to these medications.  He is also reporting having been on Adderall for years for management of ADHD symptoms. Denies side effects. We have reviewed potential side effects of these medications, including addictive properties, and possible negative impact on mood and anxiety symptoms. We discussed potential gradual taper or detox - at this time patient not considering detox and ambivalent about tapering doses, as he is concerned that " it took me years to find a combination that worked well for me". During session his affect tended to brighten partially and discussed issues that have affected his mood recently. States he left [place] recently following difficulties with his employment, has family in [place], but feels family is currently not supportive, and that efforts to become closer to his family and his adult children are not working out. At this time he is future oriented, and is currently planning on relocating to [place] where he has lived before. Responds partially to support , empathy.    Principal Problem: MDD (major depressive disorder), recurrent episode, severe ([place]) Diagnosis:   Patient Active Problem List   Diagnosis Date Noted   . MDD (major depressive disorder), recurrent episode, severe ([place]) [F33.2] 02/15/2017  . Chronic pain syndrome [G89.4] 02/15/2017  . RLQ abdominal pain [R10.31] 11/06/2014  . RUQ abdominal pain [R10.11] 11/06/2014  . Chronic abdominal pain [R10.9, G89.29]   . GERD [K21.9] 03/22/2010  . MUSCLE SPASM, BACK [M53.80] 03/22/2010  . SYNCOPE [R55] 03/22/2010  . THYROID CANCER, HX OF [Z85.850] 03/22/2010  . DEPRESSION, HX OF [Z86.59] 03/22/2010   Total Time spent with patient: 30 minutes  Past Medical History:  Past Medical History:  Diagnosis Date  . Chronic abdominal pain   . Colon polyps   . Thyroid cancer [facility])     Past Surgical History:  Procedure Laterality Date  . CHOLECYSTECTOMY    . COLONOSCOPY    . THYROIDECTOMY     2004/2005   Family History:  Family History  Problem Relation Age of Onset  . Kidney cancer Father   . Hypertension Father   . Hypertension Mother   . Hyperthyroidism Brother   . Thyroid cancer Maternal Uncle    Social History:  History  Alcohol Use  . 0.0 oz/week     History  Drug Use  . Types: Marijuana    Social History   Social History  . Marital status: Divorced    Spouse name: N/A  . Number of children: 2  . Years of education: N/A   Occupational History  . event planner     Social History Main Topics  . Smoking status: Current Every Day Smoker    Packs/day: 0.50    Years: 35.00    Types: Cigarettes  . Smokeless tobacco:  Never Used  . Alcohol use 0.0 oz/week  . Drug use: Yes    Types: Marijuana  . Sexual activity: No   Other Topics Concern  . None   Social History Narrative  . None   Additional Social History:    Pain Medications: See MAR Prescriptions: See MAR Over the Counter: See MAR History of alcohol / drug use?: No history of alcohol / drug abuse Longest period of sobriety (when/how long): N/A  Sleep: Good  Appetite:  Good  Current Medications: Current Facility-Administered Medications  Medication Dose  Route Frequency Provider Last Rate Last Dose  . acetaminophen (TYLENOL) tablet 650 mg  650 mg Oral Q6H PRN Laverle Hobby, PA-C      . alum & mag hydroxide-simeth (MAALOX/MYLANTA) 200-200-20 MG/5ML suspension 30 mL  30 mL Oral Q4H PRN Laverle Hobby, PA-C      . amphetamine-dextroamphetamine (ADDERALL) tablet 20 mg  20 mg Oral Q breakfast Cobos, Myer Peer, MD      . hydrOXYzine (ATARAX/VISTARIL) tablet 25 mg  25 mg Oral Q6H PRN Laverle Hobby, PA-C      . ibuprofen (ADVIL,MOTRIN) tablet 800 mg  800 mg Oral Q6H PRN Laverle Hobby, PA-C      . levothyroxine (SYNTHROID, LEVOTHROID) tablet 200 mcg  200 mcg Oral QAC breakfast Laverle Hobby, PA-C   200 mcg at 02/16/17 0754  . magnesium hydroxide (MILK OF MAGNESIA) suspension 30 mL  30 mL Oral Daily PRN Laverle Hobby, PA-C      . mirtazapine (REMERON) tablet 45 mg  45 mg Oral QHS Patriciaann Clan E, PA-C   45 mg at 02/15/17 2156  . nicotine polacrilex (NICORETTE) gum 2 mg  2 mg Oral PRN Derrill Center, NP   2 mg at 02/16/17 1253  . oxyCODONE (OXYCONTIN) 12 hr tablet 10 mg  10 mg Oral BID Cobos, Myer Peer, MD   10 mg at 02/16/17 1252  . pantoprazole (PROTONIX) EC tablet 40 mg  40 mg Oral Daily Laverle Hobby, PA-C   40 mg at 02/16/17 0754  . rosuvastatin (CRESTOR) tablet 10 mg  10 mg Oral Daily Laverle Hobby, PA-C   10 mg at 02/16/17 0754  . traZODone (DESYREL) tablet 50 mg  50 mg Oral QHS PRN Cobos, Myer Peer, MD        Lab Results:  Results for orders placed or performed during the hospital encounter of 02/15/17 (from the past 48 hour(s))  CBC     Status: Abnormal   Collection Time: 02/15/17  6:53 AM  Result Value Ref Range   WBC 11.7 (H) 4.0 - 10.5 K/uL   RBC 4.18 (L) 4.22 - 5.81 MIL/uL   Hemoglobin 12.9 (L) 13.0 - 17.0 g/dL   HCT 37.9 (L) 39.0 - 52.0 %   MCV 90.7 78.0 - 100.0 fL   MCH 30.9 26.0 - 34.0 pg   MCHC 34.0 30.0 - 36.0 g/dL   RDW 14.0 11.5 - 15.5 %   Platelets 337 150 - 400 K/uL    Comment: Performed at [facility], [place] [address], [place], [place] [address]  Comprehensive metabolic panel     Status: Abnormal   Collection Time: 02/15/17  6:53 AM  Result Value Ref Range   Sodium 139 135 - 145 mmol/L   Potassium 3.7 3.5 - 5.1 mmol/L   Chloride 103 101 - 111 mmol/L   CO2 27 22 - 32 mmol/L   Glucose, Bld 118 (  H) 65 - 99 mg/dL   BUN 18 6 - 20 mg/dL   Creatinine, Ser 0.99 0.61 - 1.24 mg/dL   Calcium 8.8 (L) 8.9 - 10.3 mg/dL   Total Protein 6.8 6.5 - 8.1 g/dL   Albumin 3.8 3.5 - 5.0 g/dL   AST 16 15 - 41 U/L   ALT 13 (L) 17 - 63 U/L   Alkaline Phosphatase 83 38 - 126 U/L   Total Bilirubin 0.4 0.3 - 1.2 mg/dL   GFR calc non Af Amer >60 >60 mL/min   GFR calc Af Amer >60 >60 mL/min    Comment: (NOTE) The eGFR has been calculated using the CKD EPI equation. This calculation has not been validated in all clinical situations. eGFR's persistently <60 mL/min signify possible Chronic Kidney Disease.    Anion gap 9 5 - 15    Comment: Performed at [facility], [place] [address], [place], [place] [address]  Hemoglobin A1c     Status: Abnormal   Collection Time: 02/15/17  6:53 AM  Result Value Ref Range   Hgb A1c MFr Bld 6.4 (H) 4.8 - 5.6 %    Comment: (NOTE) Pre diabetes:          5.7%-6.4% Diabetes:              >6.4% Glycemic control for   <7.0% adults with diabetes    Mean Plasma Glucose 136.98 mg/dL    Comment: Performed at [place] [address] [place], [place] [address]  Magnesium     Status: None   Collection Time: 02/15/17  6:53 AM  Result Value Ref Range   Magnesium 2.1 1.7 - 2.4 mg/dL    Comment: Performed at [facility], [place] [address] [place], [place] [address]  Lipid panel     Status: Abnormal   Collection Time: 02/15/17  6:53 AM  Result Value Ref Range   Cholesterol 269 (H) 0 - 200 mg/dL   Triglycerides 160 (H) <150 mg/dL   HDL 45 >40 mg/dL   Total CHOL/HDL Ratio 6.0 RATIO   VLDL 32 0 - 40 mg/dL   LDL  Cholesterol 192 (H) 0 - 99 mg/dL    Comment:        Total Cholesterol/HDL:CHD Risk Coronary Heart Disease Risk Table                     Men   Women  1/2 Average Risk   3.4   3.3  Average Risk       5.0   4.4  2 X Average Risk   9.6   7.1  3 X Average Risk  23.4   11.0        Use the calculated Patient Ratio above and the CHD Risk Table to determine the patient's CHD Risk.        ATP III CLASSIFICATION (LDL):  <100     mg/dL   Optimal  100-129  mg/dL   Near or Above                    Optimal  130-159  mg/dL   Borderline  160-189  mg/dL   High  >190     mg/dL   Very High Performed at [place] [address], [place], [place] [address]   Hepatic function panel     Status: Abnormal   Collection Time: 02/15/17  6:53 AM  Result Value Ref  Range   Total Protein 6.8 6.5 - 8.1 g/dL   Albumin 3.8 3.5 - 5.0 g/dL   AST 17 15 - 41 U/L   ALT 14 (L) 17 - 63 U/L   Alkaline Phosphatase 86 38 - 126 U/L   Total Bilirubin 0.4 0.3 - 1.2 mg/dL   Bilirubin, Direct <0.1 (L) 0.1 - 0.5 mg/dL   Indirect Bilirubin NOT CALCULATED 0.3 - 0.9 mg/dL    Comment: Performed at [facility], [place] [address] [place], [place] [address]  TSH     Status: Abnormal   Collection Time: 02/15/17  6:53 AM  Result Value Ref Range   TSH 88.913 (H) 0.350 - 4.500 uIU/mL    Comment: Performed by a 3rd Generation assay with a functional sensitivity of <=0.01 uIU/mL. Performed at [facility], [place] [address], [place], [place] [address]   Prolactin     Status: Abnormal   Collection Time: 02/15/17  6:53 AM  Result Value Ref Range   Prolactin 39.3 (H) 4.0 - 15.2 ng/mL    Comment: (NOTE) Performed At: [facility] [place], [place] [id number] Lindon Romp MD YB:[id number] Performed at [facility], [place] [address], [place], [place] [address]   Ethanol     Status: None   Collection Time: 02/15/17  6:53 AM  Result Value Ref  Range   Alcohol, Ethyl (B) <10 <10 mg/dL    Comment:        LOWEST DETECTABLE LIMIT FOR SERUM ALCOHOL IS 10 mg/dL FOR MEDICAL PURPOSES ONLY Performed at [facility], [place] [address] [place], [place] [address]   Urine rapid drug screen (hosp performed)not at [facility]     Status: Abnormal   Collection Time: 02/15/17  2:58 PM  Result Value Ref Range   Opiates POSITIVE (A) NONE DETECTED   Cocaine NONE DETECTED NONE DETECTED   Benzodiazepines NONE DETECTED NONE DETECTED   Amphetamines POSITIVE (A) NONE DETECTED   Tetrahydrocannabinol POSITIVE (A) NONE DETECTED   Barbiturates NONE DETECTED NONE DETECTED    Comment:        DRUG SCREEN FOR MEDICAL PURPOSES ONLY.  IF CONFIRMATION IS NEEDED FOR ANY PURPOSE, NOTIFY LAB WITHIN 5 DAYS.        LOWEST DETECTABLE LIMITS FOR URINE DRUG SCREEN Drug Class       Cutoff (ng/mL) Amphetamine      1000 Barbiturate      200 Benzodiazepine   035 Tricyclics       597 Opiates          300 Cocaine          300 THC              50 Performed at [facility], [place] [address] [place], [place] [address]     Blood Alcohol level:  Lab Results  Component Value Date   ETH <10 45/36/4680    Metabolic Disorder Labs: Lab Results  Component Value Date   HGBA1C 6.4 (H) 02/15/2017   MPG 136.98 02/15/2017   Lab Results  Component Value Date   PROLACTIN 39.3 (H) 02/15/2017   Lab Results  Component Value Date   CHOL 269 (H) 02/15/2017   TRIG 160 (H) 02/15/2017   HDL 45 02/15/2017   CHOLHDL 6.0 02/15/2017   VLDL 32 02/15/2017   LDLCALC 192 (H) 02/15/2017    Physical Findings: AIMS: Facial and Oral Movements Muscles of Facial Expression: None, normal Lips and Perioral  Area: None, normal Jaw: None, normal Tongue: None, normal,Extremity Movements Upper (arms, wrists, hands, fingers): None, normal Lower (legs, knees, ankles, toes): None, normal, Trunk Movements Neck, shoulders, hips: None, normal, Overall  Severity Severity of abnormal movements (highest score from questions above): None, normal Incapacitation due to abnormal movements: None, normal Patient's awareness of abnormal movements (rate only patient's report): No Awareness, Dental Status Current problems with teeth and/or dentures?: Yes Does patient usually wear dentures?: No  CIWA:  CIWA-Ar Total: 0 COWS:     Musculoskeletal: Strength & Muscle Tone: within normal limits Gait & Station: normal Patient leans: N/A  Psychiatric Specialty Exam: Physical Exam  ROS denies headache, no chest pain, reports chronic pain , mainly shoulder and elbow  Blood pressure 118/72, pulse 83, temperature 97.8 F (36.6 C), temperature source Oral, resp. rate 18, height '6\' 2"'  (1.88 m), weight 78.9 kg (174 lb).Body mass index is 22.34 kg/m.  General Appearance: Fairly Groomed  Eye Contact:  Good  Speech:  Normal Rate  Volume:  Normal  Mood:  Dysphoric  Affect:  irritable , but reactive, and tends to improve during session  Thought Process:  Linear and Descriptions of Associations: Intact  Orientation:  Other:  fully alert and attentive   Thought Content:  denies hallucinations, no delusions, not internally preoccupied   Suicidal Thoughts:  No today denies suicidal or self injurious ideations, denies homicidal or violent ideations  Homicidal Thoughts:  No  Memory:  recent and remote grossly intact   Judgement:  Fair  Insight:  Fair  Psychomotor Activity:  Normal- no restlessness or acute distress/discomfort at this time  Concentration:  Concentration: Good and Attention Span: Good  Recall:  Good  Fund of Knowledge:  Good  Language:  Good  Akathisia:  Negative  Handed:  Right  AIMS (if indicated):     Assets:  Communication Skills Desire for Improvement Resilience  ADL's:  Intact  Cognition:  WNL  Sleep:  Number of Hours: 6.5   Assessment - patient presents irritable, medication focused . He states he has been on Adderall for  years  without side effects, and that he has been on opiates ( currently on Oxycontin/Oxycodone PRN combination) for many years . He denies abusing these medications, and is currently reluctant to tapering off . We have reviewed potential side effects, risks associated with Opiate/Stimulant management. Agrees to  decreasing doses gradually while on inpatient setting .  (*With [facility] pharmacist, registry has been reviewed, and Oxycontin /Oxycodone/Adderall doses and  management have been confirmed .)  Denies suicidal ideations, does  remain ruminative about psychosocial stressors. Future oriented, currently wanting to relocate to [place] , [place] after discharge. * Of note, patient met with Dr. Nancy Fetter - psychiatry attending- as well, in order to discuss current medication and  treatment options    Treatment Plan Summary: Daily contact with patient to assess and evaluate symptoms and progress in treatment, Medication management, Plan as tolerated  and regular Encourage group and milieu participation to work on coping skills and symptom reduction Treatment team working on disposition planning options Adderall 20 mgrs BID for ADHD ( down from 30 mgrs BID  prior to admission)  Oxycontin 10 mgrs BID for chronic pain ( down from 15 mgrs BID prior to admission)  Oxycodone 5-10 mgrs Q 8 hours PRN for breakthrough pain if needed .  Jenne Campus, MD 02/16/2017, 1:40 PM

## 2017-02-16 NOTE — Progress Notes (Signed)
Pt is intrusive and demanding attempting to dictate his medications and knocking on the doctor's door when he does not have a medication ordered that he wants. Pt has had multiple medication adjustments today. He continues to ask for narcotics even though it has been explained by MD and nurse that there is at least eight hours in between doses.

## 2017-02-16 NOTE — Progress Notes (Addendum)
Patient ID: Lee Mitchell, male   DOB: [birth year], 66 y.o.   MRN: [id number]  Pt currently presents with a blunted affect and guarded behavior. Pt remains in the dayroom, has limited interaction with peers. During interaction with this Probation officer, patient is curt and often responds to a question with another question.  Pt reports to writer that their goal is to "try to take the medicines the way the doctor and I talked about them." Pt reports poor sleep with current medication regimen. Pt states "I'm just not going to sleep tonight." Main complaint is ongoing generalized pain 10/10.   Pt provided with medications per providers orders. Pt's labs and vitals were monitored throughout the night. Pt given a 1:1 about emotional and mental status. Pt supported and encouraged to express concerns and questions. Pt educated on medications and as needed medication available.   Pt's safety ensured with 15 minute and environmental checks. Pt currently denies SI/HI and A/V hallucinations. Pt verbally agrees to seek staff if SI/HI or A/VH occurs and to consult with staff before acting on any harmful thoughts. Pt reports being open to an as needed medication for sleep, see MAR. Will continue POC.

## 2017-02-17 ENCOUNTER — Telehealth (INDEPENDENT_AMBULATORY_CARE_PROVIDER_SITE_OTHER): Payer: Self-pay | Admitting: Radiology

## 2017-02-17 LAB — URINALYSIS, ROUTINE W REFLEX MICROSCOPIC
Bilirubin Urine: NEGATIVE
GLUCOSE, UA: NEGATIVE mg/dL
Hgb urine dipstick: NEGATIVE
KETONES UR: NEGATIVE mg/dL
LEUKOCYTES UA: NEGATIVE
NITRITE: NEGATIVE
PROTEIN: NEGATIVE mg/dL
Specific Gravity, Urine: 1.012 (ref 1.005–1.030)
Squamous Epithelial / LPF: NONE SEEN
pH: 7 (ref 5.0–8.0)

## 2017-02-17 MED ORDER — AMPHETAMINE-DEXTROAMPHETAMINE 10 MG PO TABS
20.0000 mg | ORAL_TABLET | Freq: Two times a day (BID) | ORAL | Status: DC
  Administered 2017-02-17: 20 mg via ORAL
  Filled 2017-02-17: qty 2

## 2017-02-17 MED ORDER — AMPHETAMINE-DEXTROAMPHETAMINE 10 MG PO TABS: 20.0000 mg | ORAL_TABLET | Freq: Two times a day (BID) | ORAL | Status: DC

## 2017-02-17 NOTE — Progress Notes (Signed)
Patient ID: Lee Mitchell, male   DOB: 02/27/1951, 67 y.o.   MRN: [id number]  Pt awakens and walks to the alcove to read a book. States to Probation officer "the medication didn't work" to help him sleep. Pt reports ongoing pain 10/10 and "body chills." Offered patient as needed medication to which pt says "I mean you have nothing for me, unless you can press your hand on my head to put me to sleep." Pt requests snack, provided to patient. Pt given a blanket while he reads in the alcove. Notified to let writer know if chills worsen. Will continue to monitor.

## 2017-02-17 NOTE — Telephone Encounter (Signed)
Patient called, said he is in town for a few days and wants to see if he can possibly see you for his left shoulder pain.  His surgery was in 2007, left shld scope, intraarticular debridement biceps tendon, debridement of labral tear, mini open biceps tenodesis.  I can work him in at 70 if it is ok with you? Your patients start at 1pm.  He asked if you could call him tonight, I told him I was not sure that was possible.  I can call him in the morning, or whenever you advise on this.  Thanks.

## 2017-02-17 NOTE — Progress Notes (Addendum)
Patient ID: Miqueas Whilden, male   DOB: [birth year], 66 y.o.   MRN: [id number]  Pt offered scheduled medication this morning. Pt begins to yell at Probation officer in the dayroom. States "So now I can't eat. You should be giving me this two hours before I eat, you should know that!" Writer asks patient to speak with her at the medication window. Pt again asks to take "all of my medications." Notified of medication parameters. Pt refused all medication including his Synthroid because he could not take his Adderall earlier than scheduled. Pt states "I'm not mad at you. I can't trust this doctor. I am going to have to talk with him. I am not taking any of it."

## 2017-02-17 NOTE — Progress Notes (Signed)
Pt. Discharged per MD orders;  PT. Currently denies any HI/SI or AVH.  Pt. Was given education regarding follow up appointments and medications by RN.  Pt. Denies any questions or concerns about the medications.  Pt. Was escorted to the search room to retrieve his belongings by RN before being discharged to the hospital lobby.

## 2017-02-17 NOTE — Progress Notes (Signed)
  [facility] Adult Case Management Discharge Plan :  Will you be returning to the same living situation after discharge:  Yes,  Pt returning to [place] At discharge, do you have transportation home?: Yes,  Pt has his own car Do you have the ability to pay for your medications: Yes,  Pt provided with samples and prescriptions  Release of information consent forms completed and in the chart;  Patient's signature needed at discharge.  Patient to Follow up at: Follow-up Information    [facility] Follow up on 02/24/2017.   Why:  at 1:00pm with [facility] for therapy.  Contact information: [address] [place], [place] [address]  Phone: [phone] Fax: [phone]       Pt prefers to schedule his own medication management appointment Follow up.           Next level of care provider has access to [place] and Suicide Prevention discussed: Yes,  with Pt; declines family contact  Have you used any form of tobacco in the last 30 days? (Cigarettes, Smokeless Tobacco, Cigars, and/or Pipes): Yes  Has patient been referred to the Quitline?: Patient refused referral  Patient has been referred for addiction treatment: Yes  Gladstone Lighter, LCSW 02/17/2017, 9:50 AM

## 2017-02-17 NOTE — [facility]HH Suicide Risk Assessment (Signed)
[facility] Discharge Suicide Risk Assessment   Principal Problem: MDD (major depressive disorder), recurrent episode, severe ([place]) Discharge Diagnoses:  Patient Active Problem List   Diagnosis Date Noted  . MDD (major depressive disorder), recurrent episode, severe ([place]) [F33.2] 02/15/2017  . Chronic pain syndrome [G89.4] 02/15/2017  . RLQ abdominal pain [R10.31] 11/06/2014  . RUQ abdominal pain [R10.11] 11/06/2014  . Chronic abdominal pain [R10.9, G89.29]   . GERD [K21.9] 03/22/2010  . MUSCLE SPASM, BACK [M53.80] 03/22/2010  . SYNCOPE [R55] 03/22/2010  . THYROID CANCER, HX OF [Z85.850] 03/22/2010  . DEPRESSION, HX OF [Z86.59] 03/22/2010    Total Time spent with patient: 30 minutes  Musculoskeletal: Strength & Muscle Tone: within normal limits Gait & Station: normal Patient leans: N/A  Psychiatric Specialty Exam: ROS chronic pain , currently mainly on shoulder/elbow.   Blood pressure (!) 165/79, pulse 92, temperature (!) 97.5 F (36.4 C), temperature source Oral, resp. rate 20, height 6\' 2"  (1.88 m), weight 78.9 kg (174 lb).Body mass index is 22.34 kg/m.  General Appearance: Well Groomed  Eye Contact::  Good  Speech:  Normal Rate409  Volume:  Normal  Mood:  mood partially improved, less depressed  Affect:  today less irritable, more reactive , tearful at times when discussing stressors such as being distant from his adult children  Thought Process:  Linear and Descriptions of Associations: Intact  Orientation:  Full (Time, Place, and Person)  Thought Content:  no hallucinations, no delusions, not internally preoccupied   Suicidal Thoughts:  No denies suicidal or self injurious ideations, no homicidal or violent ideations  Homicidal Thoughts:  No  Memory:  recent and remote grossly intact   Judgement:  Other:  improving   Insight:  Fair and improving   Psychomotor Activity:  Normal  Concentration:  Good  Recall:  Good  Fund of Knowledge:Good  Language: Good  Akathisia:   Negative  Handed:  Right  AIMS (if indicated):     Assets:  Communication Skills Desire for Improvement Resilience  Sleep:  Number of Hours: 2  Cognition: WNL  ADL's:  Intact   Mental Status Per Nursing Assessment::   On Admission:     Demographic Factors:  66 year old divorced male, recently relocated from [facility], has two adult children  Loss Factors: Chronic pain, distanced from family   Historical Factors: Reports history of depression , mainly related to family related stressors . History of chronic pain. Reports long term opiate and stimulant ( Adderall) management.   Risk Reduction Factors:   Sense of responsibility to family and Positive coping skills or problem solving skills   Continued Clinical Symptoms:  Patient presents with improving mood, affect today is less irritable, more reactive. No thought disorder, no suicidal or self injurious ideations, no homicidal or violent ideations. Future oriented . He is hopeful he will be able to repair the relationship with his adult children overtime , and is currently planning on relocating to [place] , [place], where he has resided before . Regarding medications: he has reported he has been on Opiate Analgesics and  Adderall for years , with good response and no side effects. States he has functioned better on these medications. Potential side effects have been reviewed, and he is aware of abuse potential , sedation potential . He has also been on Remeron for depression, and denies side effects or excessive sedation. He has expressed interest in processing /working in psychotherapy, to address  chronic stressors, losses , mostly related to distanced relationship from  children, and difficulties with employment opportunities based on past issues.     Cognitive Features That Contribute To Risk:  No gross cognitive deficits noted upon discharge. Is alert , attentive, and oriented x 3   Suicide Risk:  Mild:  Suicidal ideation of limited  frequency, intensity, duration, and specificity.  There are no identifiable plans, no associated intent, mild dysphoria and related symptoms, good self-control (both objective and subjective assessment), few other risk factors, and identifiable protective factors, including available and accessible social support.  Follow-up Information    [facility] Follow up on 02/24/2017.   Why:  at 1:00pm with [facility] for therapy.  Contact information: [address], [place], [place] [address]  Phone: [phone] Fax: [phone]       Pt prefers to schedule his own medication management appointment Follow up.           Plan Of Care/Follow-up recommendations:  Activity:  as tolerated Diet:  regular Tests:  NA Other:  See below  Patient indicates  readiness for discharge- no grounds for involuntary commitment  Follow up as above. Of note, expresses interest in IOP level of care  *  does not need prescriptions at discharge - has prescription/medication from his outpatient provider.  Jenne Campus, MD 02/17/2017, 1:11 PM
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
## 2017-02-17 NOTE — Progress Notes (Addendum)
Patient ID: Lee Mitchell, male   DOB: August 13, 1950, 66 y.o.   MRN: [id number]  Pt awakens and asks writer to "give me all of my medications." Pt requests to take oxycodone, percocet 10-650, levothyroxine and aderall. Pt notified of medication order parameters. Offered an oxycodone and 5-325 percocet to which patient said "goodbye" and walked away from the window. Will continue to monitor.   5:17 AM Pt approaches the nurses station again and asks the nurse to call the MD. When asked the reasoning behind this request, pt states "he did not keep his end of the bargain." Pt notified that MD will return to hospital in the morning. Pt offered medications again, refused. Pt then walks back to his room and says over writer, "I have said all that I need to say. Thank you."

## 2017-02-18 ENCOUNTER — Telehealth (INDEPENDENT_AMBULATORY_CARE_PROVIDER_SITE_OTHER): Payer: Self-pay | Admitting: Orthopaedic Surgery

## 2017-02-18 NOTE — Telephone Encounter (Signed)
I called. Elbow pain, shoulder pain. He was just discharged , discussed. He is living in [facility]. He will seek MRI there. FYI

## 2017-02-18 NOTE — Telephone Encounter (Signed)
[name],Lee Mitchell 01-Oct-1950    Dr.yates please call pt  [phone] its pertaining to his shoulder

## 2017-02-18 NOTE — Telephone Encounter (Signed)
Please advise 

## 2017-02-18 NOTE — Telephone Encounter (Signed)
noted 

## 2017-03-27 DIAGNOSIS — F331 Major depressive disorder, recurrent, moderate: Secondary | ICD-10-CM | POA: Insufficient documentation

## 2017-03-28 DIAGNOSIS — F17200 Nicotine dependence, unspecified, uncomplicated: Secondary | ICD-10-CM | POA: Insufficient documentation

## 2017-09-21 DIAGNOSIS — M25562 Pain in left knee: Secondary | ICD-10-CM | POA: Insufficient documentation

## 2017-09-21 DIAGNOSIS — M25561 Pain in right knee: Secondary | ICD-10-CM | POA: Insufficient documentation

## 2017-10-06 DIAGNOSIS — M5136 Other intervertebral disc degeneration, lumbar region: Secondary | ICD-10-CM | POA: Insufficient documentation

## 2017-10-06 DIAGNOSIS — M51369 Other intervertebral disc degeneration, lumbar region without mention of lumbar back pain or lower extremity pain: Secondary | ICD-10-CM | POA: Insufficient documentation

## 2017-11-16 DIAGNOSIS — J189 Pneumonia, unspecified organism: Secondary | ICD-10-CM | POA: Insufficient documentation

## 2017-11-21 DIAGNOSIS — J189 Pneumonia, unspecified organism: Secondary | ICD-10-CM | POA: Insufficient documentation

## 2017-11-30 LAB — HEMOGLOBIN A1C: HEMOGLOBIN A1C: 7

## 2018-01-17 DIAGNOSIS — R911 Solitary pulmonary nodule: Secondary | ICD-10-CM | POA: Diagnosis not present

## 2018-01-17 DIAGNOSIS — R160 Hepatomegaly, not elsewhere classified: Secondary | ICD-10-CM | POA: Diagnosis not present

## 2018-01-17 DIAGNOSIS — R918 Other nonspecific abnormal finding of lung field: Secondary | ICD-10-CM | POA: Diagnosis not present

## 2018-01-19 DIAGNOSIS — Z23 Encounter for immunization: Secondary | ICD-10-CM | POA: Diagnosis not present

## 2018-01-23 DIAGNOSIS — E7800 Pure hypercholesterolemia, unspecified: Secondary | ICD-10-CM | POA: Diagnosis not present

## 2018-01-23 DIAGNOSIS — Z136 Encounter for screening for cardiovascular disorders: Secondary | ICD-10-CM | POA: Diagnosis not present

## 2018-01-23 DIAGNOSIS — F17219 Nicotine dependence, cigarettes, with unspecified nicotine-induced disorders: Secondary | ICD-10-CM | POA: Diagnosis not present

## 2018-01-23 DIAGNOSIS — Z87891 Personal history of nicotine dependence: Secondary | ICD-10-CM | POA: Diagnosis not present

## 2018-01-23 DIAGNOSIS — Z1159 Encounter for screening for other viral diseases: Secondary | ICD-10-CM | POA: Diagnosis not present

## 2018-01-23 DIAGNOSIS — E119 Type 2 diabetes mellitus without complications: Secondary | ICD-10-CM | POA: Diagnosis not present

## 2018-02-20 ENCOUNTER — Encounter (HOSPITAL_COMMUNITY): Payer: Self-pay | Admitting: Emergency Medicine

## 2018-02-20 ENCOUNTER — Emergency Department (HOSPITAL_COMMUNITY)
Admission: EM | Admit: 2018-02-20 | Discharge: 2018-02-21 | Disposition: A | Discharge status: Home / Self Care | Payer: Medicare Other | Attending: Emergency Medicine | Admitting: Emergency Medicine

## 2018-02-20 ENCOUNTER — Emergency Department (HOSPITAL_COMMUNITY): Payer: Medicare Other

## 2018-02-20 DIAGNOSIS — Z79899 Other long term (current) drug therapy: Secondary | ICD-10-CM | POA: Diagnosis not present

## 2018-02-20 DIAGNOSIS — R0602 Shortness of breath: Secondary | ICD-10-CM | POA: Diagnosis not present

## 2018-02-20 DIAGNOSIS — F17210 Nicotine dependence, cigarettes, uncomplicated: Secondary | ICD-10-CM | POA: Diagnosis not present

## 2018-02-20 DIAGNOSIS — R0789 Other chest pain: Secondary | ICD-10-CM | POA: Diagnosis not present

## 2018-02-20 DIAGNOSIS — E079 Disorder of thyroid, unspecified: Secondary | ICD-10-CM | POA: Insufficient documentation

## 2018-02-20 DIAGNOSIS — R091 Pleurisy: Secondary | ICD-10-CM

## 2018-02-20 DIAGNOSIS — R079 Chest pain, unspecified: Secondary | ICD-10-CM | POA: Diagnosis not present

## 2018-02-20 HISTORY — DX: Pneumonia, unspecified organism: J18.9

## 2018-02-20 LAB — BASIC METABOLIC PANEL
Anion gap: 7 (ref 5–15)
BUN: 5 mg/dL — AB (ref 8–23)
CALCIUM: 8.9 mg/dL (ref 8.9–10.3)
CHLORIDE: 104 mmol/L (ref 98–111)
CO2: 23 mmol/L (ref 22–32)
CREATININE: 1.02 mg/dL (ref 0.61–1.24)
GFR calc non Af Amer: >60 mL/min (ref >60)
Glucose, Bld: 99 mg/dL (ref 70–99)
Potassium: 3.5 mmol/L (ref 3.5–5.1)
SODIUM: 134 mmol/L — AB (ref 135–145)

## 2018-02-20 LAB — CBC
HEMATOCRIT: 41.7 % (ref 39.0–52.0)
Hemoglobin: 13.6 g/dL (ref 13.0–17.0)
MCH: 28.2 pg (ref 26.0–34.0)
MCHC: 32.6 g/dL (ref 30.0–36.0)
MCV: 86.5 fL (ref 80.0–100.0)
Platelets: 367 10*3/uL (ref 150–400)
RBC: 4.82 MIL/uL (ref 4.22–5.81)
RDW: 13.9 % (ref 11.5–15.5)
WBC: 9.2 10*3/uL (ref 4.0–10.5)
nRBC: 0 % (ref 0.0–0.2)

## 2018-02-20 LAB — I-STAT TROPONIN, ED: Troponin i, poc: 0 ng/mL (ref 0.00–0.08)

## 2018-02-20 NOTE — ED Triage Notes (Signed)
Pt reports R sided chest pain and shortness of breath for about 3 weeks, reports worse tonight. States while he was driving into [place] from [place] he had lots of cold sweats/chills. Hx PNA in august.

## 2018-02-21 ENCOUNTER — Emergency Department (HOSPITAL_COMMUNITY): Payer: Medicare Other

## 2018-02-21 DIAGNOSIS — R0602 Shortness of breath: Secondary | ICD-10-CM | POA: Diagnosis not present

## 2018-02-21 LAB — I-STAT TROPONIN, ED: TROPONIN I, POC: 0 ng/mL (ref 0.00–0.08)

## 2018-02-21 MED ORDER — IOPAMIDOL (ISOVUE-370) INJECTION 76%
INTRAVENOUS | Status: AC
  Filled 2018-02-21: qty 100

## 2018-02-21 MED ORDER — FENTANYL CITRATE (PF) 100 MCG/2ML IJ SOLN
50.0000 ug | Freq: Once | INTRAMUSCULAR | Status: AC
  Administered 2018-02-21: 50 ug via INTRAVENOUS
  Filled 2018-02-21: qty 2

## 2018-02-21 MED ORDER — HYDROCODONE-ACETAMINOPHEN 5-325 MG PO TABS: 2.0000 | ORAL_TABLET | Freq: Four times a day (QID) | ORAL | 0 refills | Status: DC | PRN

## 2018-02-21 MED ORDER — ONDANSETRON HCL 4 MG/2ML IJ SOLN
4.0000 mg | Freq: Once | INTRAMUSCULAR | Status: AC
  Administered 2018-02-21: 4 mg via INTRAVENOUS
  Filled 2018-02-21: qty 2

## 2018-02-21 MED ORDER — IOPAMIDOL (ISOVUE-370) INJECTION 76%
100.0000 mL | Freq: Once | INTRAVENOUS | Status: AC | PRN
  Administered 2018-02-21: 100 mL via INTRAVENOUS

## 2018-02-21 NOTE — ED Provider Notes (Signed)
TIME SEEN: 12:50 AM  CHIEF COMPLAINT: Chest pain  HPI: Patient is a 67 year old male with previous history of thyroid cancer, esophageal cancer, previous history of pneumonia requiring a 17-day hospital admission in [place] who presents to the emergency department with right-sided sharp chest pain.  Symptoms ongoing for the past 2 weeks and progressively worsening.  States pain is worse with coughing, yawning, deep inspiration.  Feels similar to when he had pneumonia before.  States he is now started having chills, feeling hot, cough.  He is concerned he has pneumonia again.  No known history of PE.  No calf tenderness or swelling.  He is status post cholecystectomy.  ROS: See HPI Constitutional: no fever  Eyes: no drainage  ENT: no runny nose   Cardiovascular: Right-sided chest pain  Resp: SOB  GI: no vomiting GU: no dysuria Integumentary: no rash  Allergy: no hives  Musculoskeletal: no leg swelling  Neurological: no slurred speech ROS otherwise negative  PAST MEDICAL HISTORY/PAST SURGICAL HISTORY:  Past Medical History:  Diagnosis Date  . Chronic abdominal pain   . Colon polyps   . PNA (pneumonia)   . Thyroid cancer [facility])     MEDICATIONS:  Prior to Admission medications   Medication Sig Start Date End Date Taking? Authorizing Provider  levothyroxine (SYNTHROID, LEVOTHROID) 125 MCG tablet Take 125 mcg by mouth daily before breakfast.     [provider]  omeprazole (PRILOSEC) 40 MG capsule Take 40 mg by mouth 2 (two) times daily.    [provider]  rosuvastatin (CRESTOR) 10 MG tablet Take 10 mg by mouth daily. 12/02/14   [provider]    ALLERGIES:  Allergies  Allergen Reactions  . Morphine And Related     Headaches    SOCIAL HISTORY:  Social History   Tobacco Use  . Smoking status: Current Every Day Smoker    Packs/day: 0.50    Years: 35.00    Pack years: 17.50    Types: Cigarettes  . Smokeless tobacco: Never Used   Substance Use Topics  . Alcohol use: Yes    Alcohol/week: 0.0 standard drinks    FAMILY HISTORY: Family History  Problem Relation Age of Onset  . Kidney cancer Father   . Hypertension Father   . Hypertension Mother   . Hyperthyroidism Brother   . Thyroid cancer Maternal Uncle     EXAM: BP 131/85 (BP Location: Right Arm)   Pulse 86   Temp 98.9 F (37.2 C) (Oral)   Resp 18   Ht 6\' 2"  (1.88 m)   Wt 74.8 kg   SpO2 97%   BMI 21.18 kg/m  CONSTITUTIONAL: Alert and oriented and responds appropriately to questions. Well-appearing; well-nourished HEAD: Normocephalic EYES: Conjunctivae clear, pupils appear equal, EOMI ENT: normal nose; moist mucous membranes NECK: Supple, no meningismus, no nuchal rigidity, no LAD  CARD: RRR; S1 and S2 appreciated; no murmurs, no clicks, no rubs, no gallops CHEST: Tender to palpation over the right lateral lower ribs without deformity, rash, ecchymosis, redness or warmth RESP: No tachypnea; breath sounds clear and equal bilaterally; no wheezes, no rhonchi, no rales, no hypoxia or respiratory distress, speaking full sentences, patient is splinting with deep inspiration ABD/GI: Normal bowel sounds; non-distended; soft, non-tender, specifically no tenderness in the right upper quadrant, no rebound, no guarding, no peritoneal signs, no hepatosplenomegaly BACK:  The back appears normal and is non-tender to palpation, there is no CVA tenderness EXT: Normal ROM in all joints; non-tender to palpation;  no edema; normal capillary refill; no cyanosis, no calf tenderness or swelling    SKIN: Normal color for age and race; warm; no rash NEURO: Moves all extremities equally PSYCH: The patient's mood and manner are appropriate. Grooming and personal hygiene are appropriate.  MEDICAL DECISION MAKING: Patient here with right-sided chest pain.  He does appear uncomfortable and taking a deep breath.  He is afebrile with no leukocytosis.  Chest x-ray shows no pneumonia.   He does not appear volume overloaded.  This seems atypical for ACS.  I am concerned however for possible pulmonary embolus.  He is intermittently tachycardic and splinting secondary to pain.  Plan is to obtain a CTA of his chest for further evaluation.  Will give pain medication.  ED PROGRESS: Patient's second troponin is negative.  This seems very atypical for ACS.  CT shows no pulmonary embolus, no pneumonia, no volume overload.  Suspect pleurisy, chest wall pain.  Will discharge with brief prescription of Vicodin and recommended close outpatient follow-up.  Patient is comfortable with this plan.  He did receive a dose of fentanyl here and states he is feeling better.  He will call someone to pick him up and drive him home.    At this time, I do not feel there is any life-threatening condition present. I have reviewed and discussed all results (EKG, imaging, lab, urine as appropriate) and exam findings with patient/family. I have reviewed nursing notes and appropriate previous records.  I feel the patient is safe to be discharged home without further emergent workup and can continue workup as an outpatient as needed. Discussed usual and customary return precautions. Patient/family verbalize understanding and are comfortable with this plan.  Outpatient follow-up has been provided if needed. All questions have been answered.      EKG Interpretation  Date/Time:  Monday February 20 2018 22:03:12 EST Ventricular Rate:  87 PR Interval:  180 QRS Duration: 106 QT Interval:  360 QTC Calculation: 433 R Axis:   64 Text Interpretation:  Normal sinus rhythm Incomplete right bundle branch block Borderline ECG No significant change since last tracing in 2008 Confirmed by Vianny Schraeder, Cyril Mourning [phone]) on 02/21/2018 12:50:26 AM         Maven Varelas, Delice Bison, DO 02/21/18 9528

## 2018-03-03 DIAGNOSIS — F332 Major depressive disorder, recurrent severe without psychotic features: Secondary | ICD-10-CM | POA: Diagnosis not present

## 2018-03-04 DIAGNOSIS — F332 Major depressive disorder, recurrent severe without psychotic features: Secondary | ICD-10-CM | POA: Diagnosis not present

## 2018-03-06 DIAGNOSIS — F332 Major depressive disorder, recurrent severe without psychotic features: Secondary | ICD-10-CM | POA: Diagnosis not present

## 2018-03-07 ENCOUNTER — Other Ambulatory Visit: Payer: Self-pay

## 2018-03-07 ENCOUNTER — Ambulatory Visit (INDEPENDENT_AMBULATORY_CARE_PROVIDER_SITE_OTHER): Payer: Medicare Other

## 2018-03-07 ENCOUNTER — Ambulatory Visit (HOSPITAL_COMMUNITY)
Admission: EM | Admit: 2018-03-07 | Discharge: 2018-03-07 | Disposition: A | Discharge status: Home / Self Care | Payer: Medicare Other | Attending: Family Medicine | Admitting: Family Medicine

## 2018-03-07 ENCOUNTER — Encounter (HOSPITAL_COMMUNITY): Payer: Self-pay | Admitting: Emergency Medicine

## 2018-03-07 DIAGNOSIS — R079 Chest pain, unspecified: Secondary | ICD-10-CM

## 2018-03-07 MED ORDER — PREDNISONE 10 MG (21) PO TBPK: ORAL_TABLET | Freq: Every day | ORAL | 0 refills | Status: DC

## 2018-03-07 MED ORDER — HYDROCODONE-ACETAMINOPHEN 5-325 MG PO TABS: 1.0000 | ORAL_TABLET | Freq: Four times a day (QID) | ORAL | 0 refills | Status: DC | PRN

## 2018-03-07 NOTE — ED Provider Notes (Signed)
Lee Mitchell   [id number] 03/07/18 Arrival Time: 5885  ASSESSMENT & PLAN:  1. Chest pain, unspecified type   Still suspect pleuritic chest pain. CXR unchanged when compared to previous study. I have personally viewed the imaging studies ordered this visit. No acute abnormalities appreciated. No suspicion for cardiac chest pain.  Imaging: Dg Chest 2 View  Result Date: 03/07/2018  IMPRESSION: COPD. No significant pleural effusion. No evidence of pneumonia, CHF, pneumothorax, nor other acute cardiopulmonary abnormality. Electronically Signed   By: David  Martinique M.D.   On: 03/07/2018 11:12   No need for ED evaluation or admission to hospital at this time. No sign of infectious process or sepsis. Discussed and reassured.  Meds ordered this encounter  Medications  . predniSONE (STERAPRED UNI-PAK 21 TAB) 10 MG (21) TBPK tablet    Sig: Take by mouth daily. Take as directed.    Dispense:  21 tablet    Refill:  0  . HYDROcodone-acetaminophen (NORCO/VICODIN) 5-325 MG tablet    Sig: Take 1 tablet by mouth every 6 (six) hours as needed for moderate pain or severe pain.    Dispense:  10 tablet    Refill:  0  Trial of prednisone.  [place] Controlled Substances Registry consulted for this patient. I feel the risk/benefit ratio today is favorable for proceeding with this prescription for a controlled substance. Medication sedation precautions given.  He may benefit from seeing a pulmonologist. Is moving here from [place] and plans to establish care with a PCP.  Follow-up Information    [facility].   Specialty:  Emergency Medicine Why:  If symptoms worsen. Contact information: [address] [id number] mc [place] [phone]         May f/u here if needed. Reviewed expectations re: course of current medical issues. Questions answered. Outlined signs and symptoms indicating need for more acute  intervention. Patient verbalized understanding. After Visit Summary given.   SUBJECTIVE: Patient seen in the ED on 02/21/2018. Note, investigations, imaging results reviewed.  Lee Mitchell is a 67 y.o. male who presents with complaint of right sided chest pain. Present over the past 2-3 months after admission in [place] for "an infection around my lung that had to be drained". Diagnosed with pleurisy/chest wall pain in ED on 02/21/2018. Troponins negative. CT angiogram negative for PE. Given Rx Vicodin; sporadic use; helps. Does not take NSAIDs secondary to GI distress.  Today reports continuing right sided chest pain. No change from previous. Daily. Trouble sleeping secondary to pain. No SOB or respiratory difficulties. No fevers. Ambulatory without difficulty. "Just worried because this is what I felt before having to be admitted in [place]". Has felt chilled over the past few days to one week. No n/v. No specific abdominal pain. Normal bowel/bladder habits. Pain is sharp and worse with coughing or a deep breath. No wheezing reported. No rashes. No OTC treatment. No LE edema.  Social History   Tobacco Use  Smoking Status Current Every Day Smoker  . Packs/day: 0.50  . Years: 35.00  . Pack years: 17.50  . Types: Cigarettes  Smokeless Tobacco Never Used   ROS: As per HPI. All other systems negative.   OBJECTIVE:  Vitals:   03/07/18 1030  BP: 131/81  Pulse: 98  Resp: 18  Temp: 98.3 F (36.8 C)  TempSrc: Oral  SpO2: 98%    General appearance: alert; no distress HENT: normocephalic; atraumatic Neck: supple with FROM Lungs: clear to auscultation bilaterally; no  wheezing; no rales Heart: regular rate and rhythm without murmer Chest Wall: vague tenderness over R-sided and posterior ribs; healed surgical scars; no gross abnormality Abdomen: soft, non-tender Extremities: no LE edema; symmetrical with no gross deformities Skin: warm and dry; no rashes over chest or  back Neuro: normal gait Psychological: alert and cooperative; normal mood and affect  Allergies  Allergen Reactions  . Morphine And Related     Headaches    Past Medical History:  Diagnosis Date  . Chronic abdominal pain   . Colon polyps   . PNA (pneumonia)   . Thyroid cancer [facility])    Social History   Socioeconomic History  . Marital status: Divorced    Spouse name: Not on file  . Number of children: 2  . Years of education: Not on file  . Highest education level: Not on file  Occupational History  . Occupation: event planner   Social Needs  . Financial resource strain: Not on file  . Food insecurity:    Worry: Not on file    Inability: Not on file  . Transportation needs:    Medical: Not on file    Non-medical: Not on file  Tobacco Use  . Smoking status: Current Every Day Smoker    Packs/day: 0.50    Years: 35.00    Pack years: 17.50    Types: Cigarettes  . Smokeless tobacco: Never Used  Substance and Sexual Activity  . Alcohol use: Yes    Alcohol/week: 0.0 standard drinks  . Drug use: Yes    Types: Marijuana  . Sexual activity: Never  Lifestyle  . Physical activity:    Days per week: Not on file    Minutes per session: Not on file  . Stress: Not on file  Relationships  . Social connections:    Talks on phone: Not on file    Gets together: Not on file    Attends religious service: Not on file    Active member of club or organization: Not on file    Attends meetings of clubs or organizations: Not on file    Relationship status: Not on file  . Intimate partner violence:    Fear of current or ex partner: Not on file    Emotionally abused: Not on file    Physically abused: Not on file    Forced sexual activity: Not on file  Other Topics Concern  . Not on file  Social History Narrative  . Not on file   Family History  Problem Relation Age of Onset  . Kidney cancer Father   . Hypertension Father   . Hypertension Mother   . Hyperthyroidism Brother    . Thyroid cancer Maternal Uncle    Past Surgical History:  Procedure Laterality Date  . CHOLECYSTECTOMY    . COLONOSCOPY    . THYROIDECTOMY     2004/2005     Vanessa Kick, MD 03/07/18 1158

## 2018-03-07 NOTE — ED Triage Notes (Signed)
Patient said he was treated for pleurisy at 11/4.  Patient reports pain is worsening.  Prior to this patient was in [place] [facility] for 18 days.  Congestion slightly better.  Pain is just as bed if not worse.

## 2018-03-29 ENCOUNTER — Encounter (HOSPITAL_COMMUNITY): Payer: Self-pay | Admitting: Emergency Medicine

## 2018-03-29 ENCOUNTER — Telehealth: Payer: Self-pay | Admitting: Internal Medicine

## 2018-03-29 ENCOUNTER — Ambulatory Visit (HOSPITAL_COMMUNITY)
Admission: EM | Admit: 2018-03-29 | Discharge: 2018-03-29 | Disposition: A | Discharge status: Home / Self Care | Payer: Medicare Other | Attending: Family Medicine | Admitting: Family Medicine

## 2018-03-29 DIAGNOSIS — R079 Chest pain, unspecified: Secondary | ICD-10-CM | POA: Diagnosis not present

## 2018-03-29 MED ORDER — HYDROCODONE-ACETAMINOPHEN 5-325 MG PO TABS: 1.0000 | ORAL_TABLET | Freq: Four times a day (QID) | ORAL | 0 refills | Status: DC | PRN

## 2018-03-29 NOTE — Telephone Encounter (Signed)
Copied from [place] [id number]. Topic: General - Other >> Mar 29, 2018 12:36 PM Leward Quan A wrote: Reason for CRM: Patient called to say that he was referred to Dr Scarlette Calico by Laurell Josephs and would like a call back to schedule a new patient appointment if Dr Ronnald Ramp is ok with taking him on as a patient. Ph# [phone]

## 2018-03-29 NOTE — Telephone Encounter (Signed)
Yes I will see him

## 2018-03-29 NOTE — ED Notes (Signed)
Patient able to ambulate independently  

## 2018-03-29 NOTE — Telephone Encounter (Signed)
Patient is requesting to establish care with you (see message below). Dr. Ronnald Ramp, would you be willing to see this patient?

## 2018-03-29 NOTE — ED Triage Notes (Signed)
Pt presents to [facility] for continued right sided rib cage, back and flank pain.  Patient states he is also still having issues with SOB.  Patient states he is having trouble setting up a GP and wanted to speak to Dr. Renato Battles about it.

## 2018-03-29 NOTE — Telephone Encounter (Signed)
Left message informing patient.

## 2018-03-30 NOTE — ED Provider Notes (Signed)
[place]   [id number] 03/29/18 Arrival Time: [place] PLAN:  1. Chest pain, unspecified type    Unchanged suspected pleuritic chest pain. No imaging today as he has f/u with pulmonology shortly.  Meds ordered this encounter  Medications  . HYDROcodone-acetaminophen (NORCO/VICODIN) 5-325 MG tablet    Sig: Take 1 tablet by mouth every 6 (six) hours as needed for moderate pain or severe pain.    Dispense:  12 tablet    Refill:  0   [place] Controlled Substances Registry consulted for this patient. I feel the risk/benefit ratio today is favorable for proceeding with this prescription for a controlled substance. Medication sedation precautions given.  Reviewed expectations re: course of current medical issues. Questions answered. Outlined signs and symptoms indicating need for more acute intervention. Patient verbalized understanding. After Visit Summary given.   SUBJECTIVE: History from: patient. Virlan Kempker is a 67 y.o. male who presents with complaint of continued pain in R chest. Present for the past 3 months. See my last note for details. Trying to est care with PCP and received message today that he may schedule with Dr Ronnald Ramp at [place]. Pain is affecting sleep. Requests a short supply of pain medication. OTC without relief. Afebrile. No chills. No SOB reported/associated. No abdominal pain. Normal PO intake without n/v. No change in bowel/bladder habits. Ambulatory without difficulty.  ROS: As per HPI. All other systems negative.   OBJECTIVE:  Vitals:   03/29/18 1628  BP: 124/76  Pulse: 77  Resp: 18  Temp: 97.9 F (36.6 C)  TempSrc: Oral  SpO2: 100%    General appearance: alert, oriented, no acute distress Eyes: PERRLA; EOMI; conjunctivae normal HENT: normocephalic; atraumatic Neck: supple with FROM Lungs: without labored respirations; CTAB Heart: regular rate and rhythm without murmer Chest Wall: with tenderness to palpation over R  chest wall/side; poorly localized Abdomen: soft, non-tender; bowel sounds normal; no masses or organomegaly; no guarding or rebound tenderness Extremities: without edema; without calf swelling or tenderness; symmetrical without gross deformities Skin: warm and dry; without rash or lesions Psychological: alert and cooperative; normal mood and affect  Allergies  Allergen Reactions  . Morphine And Related     Headaches    Past Medical History:  Diagnosis Date  . Chronic abdominal pain   . Colon polyps   . PNA (pneumonia)   . Thyroid cancer [facility])    Social History   Socioeconomic History  . Marital status: Divorced    Spouse name: Not on file  . Number of children: 2  . Years of education: Not on file  . Highest education level: Not on file  Occupational History  . Occupation: event planner   Social Needs  . Financial resource strain: Not on file  . Food insecurity:    Worry: Not on file    Inability: Not on file  . Transportation needs:    Medical: Not on file    Non-medical: Not on file  Tobacco Use  . Smoking status: Current Every Day Smoker    Packs/day: 0.50    Years: 35.00    Pack years: 17.50    Types: Cigarettes  . Smokeless tobacco: Never Used  Substance and Sexual Activity  . Alcohol use: Yes    Alcohol/week: 0.0 standard drinks  . Drug use: Yes    Types: Marijuana  . Sexual activity: Never  Lifestyle  . Physical activity:    Days per week: Not on file    Minutes per session:  Not on file  . Stress: Not on file  Relationships  . Social connections:    Talks on phone: Not on file    Gets together: Not on file    Attends religious service: Not on file    Active member of club or organization: Not on file    Attends meetings of clubs or organizations: Not on file    Relationship status: Not on file  . Intimate partner violence:    Fear of current or ex partner: Not on file    Emotionally abused: Not on file    Physically abused: Not on file     Forced sexual activity: Not on file  Other Topics Concern  . Not on file  Social History Narrative  . Not on file   Family History  Problem Relation Age of Onset  . Kidney cancer Father   . Hypertension Father   . Hypertension Mother   . Hyperthyroidism Brother   . Thyroid cancer Maternal Uncle    Past Surgical History:  Procedure Laterality Date  . CHOLECYSTECTOMY    . COLONOSCOPY    . THYROIDECTOMY     2004/2005     Vanessa Kick, MD 03/30/18 [phone]

## 2018-04-03 ENCOUNTER — Telehealth: Payer: Self-pay | Admitting: Pulmonary Disease

## 2018-04-03 ENCOUNTER — Encounter: Payer: Self-pay | Admitting: Pulmonary Disease

## 2018-04-03 ENCOUNTER — Ambulatory Visit (INDEPENDENT_AMBULATORY_CARE_PROVIDER_SITE_OTHER): Payer: Medicare Other | Admitting: Pulmonary Disease

## 2018-04-03 VITALS — BP 118/72 | HR 94 | Ht 73.0 in | Wt 177.0 lb

## 2018-04-03 DIAGNOSIS — J432 Centrilobular emphysema: Secondary | ICD-10-CM

## 2018-04-03 DIAGNOSIS — J438 Other emphysema: Secondary | ICD-10-CM

## 2018-04-03 DIAGNOSIS — R0789 Other chest pain: Secondary | ICD-10-CM | POA: Diagnosis not present

## 2018-04-03 MED ORDER — ACETAMINOPHEN-CODEINE #3 300-30 MG PO TABS: 1.0000 | ORAL_TABLET | Freq: Two times a day (BID) | ORAL | 0 refills | Status: AC | PRN

## 2018-04-03 MED ORDER — UMECLIDINIUM-VILANTEROL 62.5-25 MCG/INH IN AEPB: 1.0000 | INHALATION_SPRAY | Freq: Every day | RESPIRATORY_TRACT | 0 refills | Status: DC

## 2018-04-03 NOTE — Telephone Encounter (Signed)
Called and spoke with patient he is aware and verbalized understanding. Nothing further needed.  

## 2018-04-03 NOTE — Telephone Encounter (Signed)
Pt is returning call CB [phone]

## 2018-04-03 NOTE — Patient Instructions (Addendum)
Lung capacity is at 55%. Trial of ANORO once daily instead of Symbicort -call us for prescription if this works better for you  You have to quit smoking completely  Residual chest pain may be due to surgery Trial of Advil once daily after food, take Tylenol 500 mg at bedtime  -try this regimen for 2 weeks. Can also use Lidoderm patch  Tylenol#3 1 tablet twice a day as needed for 2 weeks #28

## 2018-04-03 NOTE — Progress Notes (Addendum)
Subjective:    Patient ID: Lee Mitchell, male    DOB: June 30, 1950, 67 y.o.   MRN: [id number]  HPI  Chief Complaint  Patient presents with  . Consult    Self referral- Pt states that in Aug 2019 he went to the ER with SOB. States he had pleural effusion, pneumonia,18 day admission at hospital. Pt states that he still feels the effect, has pain when yawning, sneezing or coughing,   66 year old smoker presents to establish care for COPD. He was hospitalized at [facility] for 18 days starting 7/30 for right-sided pneumonia with a parapneumonic effusion.  He required VATS decortication 8/7.  Imaging showed bilateral subpleural lung mass with isolated large right paratracheal lymph node.  He was treated with antibiotics.  Course was complicated by left upper extremity DVT due to PICC line.  He was noted to be hypoxic at time of discharge.  Since this admission, he reports persistent intermittent right-sided chest plane along the axillary line which occurs and is worse with coughing, sneezing yawning or lying down on the right side and seems to be relieved spontaneously.  He also reports cough productive white sputum.  He reports dyspnea on exertion including routine activities. Pain radiates to right shoulder.  He has been to the emergency room once on 11/4 and to the urgent care twice since then. CT angiogram 11/5 did not show any evidence of bone embolism, showed emphysema and right pleural thickening.  Chest x-ray from 11/19 was personally reviewed which did not show any infiltrates or effusions some blunting of the right costophrenic angle  He has been started on Symbicort which does not seem to relieve his symptoms much.  He smokes 5 to 6 cigarettes a day, smoked about a pack per day for 20 years, initially and then half pack per day more than 30 pack years  Significant tests/ events reviewed  Spirometry showed moderate airway obstruction with ratio of 56, FEV1 55% FVC of  74%  Past Medical History:  Diagnosis Date  . Chronic abdominal pain   . Colon polyps   . PNA (pneumonia)   . Thyroid cancer ([place])    Barrett's esophagus  Past Surgical History:  Procedure Laterality Date  . ANKLE ARTHROSCOPY WITH RECONSTRUCTION    . CHOLECYSTECTOMY    . COLONOSCOPY    . ELBOW ARTHROSCOPY WITH TENDON RECONSTRUCTION    . SHOULDER ARTHROSCOPY WITH LABRAL REPAIR    . THYROIDECTOMY     2004/2005  . ULNAR NERVE TRANSPOSITION      Allergies  Allergen Reactions  . Morphine And Related     Headaches    Social History   Socioeconomic History  . Marital status: Divorced    Spouse name: Not on file  . Number of children: 2  . Years of education: Not on file  . Highest education level: Not on file  Occupational History  . Occupation: event planner   Social Needs  . Financial resource strain: Not on file  . Food insecurity:    Worry: Not on file    Inability: Not on file  . Transportation needs:    Medical: Not on file    Non-medical: Not on file  Tobacco Use  . Smoking status: Current Every Day Smoker    Packs/day: 0.25    Years: 35.00    Pack years: 8.75    Types: Cigarettes  . Smokeless tobacco: Never Used  Substance and Sexual Activity  . Alcohol use: Yes  Alcohol/week: 0.0 standard drinks  . Drug use: Yes    Types: Marijuana  . Sexual activity: Never  Lifestyle  . Physical activity:    Days per week: Not on file    Minutes per session: Not on file  . Stress: Not on file  Relationships  . Social connections:    Talks on phone: Not on file    Gets together: Not on file    Attends religious service: Not on file    Active member of club or organization: Not on file    Attends meetings of clubs or organizations: Not on file    Relationship status: Not on file  . Intimate partner violence:    Fear of current or ex partner: Not on file    Emotionally abused: Not on file    Physically abused: Not on file    Forced sexual activity: Not on  file  Other Topics Concern  . Not on file  Social History Narrative  . Not on file     Family History  Problem Relation Age of Onset  . Kidney cancer Father   . Hypertension Father   . Hypertension Mother   . Hyperthyroidism Mother   . Thyroid cancer Maternal Uncle        Review of Systems Positive as above  Constitutional: negative for anorexia, fevers and sweats  Eyes: negative for irritation, redness and visual disturbance  Ears, nose, mouth, throat, and face: negative for earaches, epistaxis, nasal congestion and sore throat  Respiratory: negative for  wheezing  Cardiovascular: negative for lower extremity edema, orthopnea, palpitations and syncope  Gastrointestinal: negative for abdominal pain, constipation, diarrhea, melena, nausea and vomiting  Genitourinary:negative for dysuria, frequency and hematuria  Hematologic/lymphatic: negative for bleeding, easy bruising and lymphadenopathy  Musculoskeletal:negative for arthralgias, muscle weakness and stiff joints  Neurological: negative for coordination problems, gait problems, headaches and weakness  Endocrine: negative for diabetic symptoms including polydipsia, polyuria and weight loss     Objective:   Physical Exam  Gen. Pleasant, well-nourished, in no distress, normal affect ENT - no lesions, no post nasal drip Neck: No JVD, no thyromegaly, no carotid bruits Lungs: Rt VATS scar, no use of accessory muscles, no dullness to percussion, clear without rales or rhonchi  Cardiovascular: Rhythm regular, heart sounds  normal, no murmurs or gallops, no peripheral edema Abdomen: soft and non-tender, no hepatosplenomegaly, BS normal. Musculoskeletal: No deformities, no cyanosis or clubbing Neuro:  alert, non focal       Assessment & Plan:

## 2018-04-03 NOTE — Assessment & Plan Note (Signed)
  Residual chest pain may be due to surgery -at this point likely to be neuropathic, not sure how his chronic pain is playing into this Trial of Advil once daily after food, take Tylenol 500 mg at bedtime  -try this regimen for 2 weeks. Can also use Lidoderm patch  Tylenol#3 1 tablet twice a day as needed for 2 weeks #28 -prescription given after much discussion with the patient, narcotic certainly not a long-term answer here would prefer nonnarcotic options as above

## 2018-04-03 NOTE — Telephone Encounter (Signed)
Spoke with Dr. Elsworth Soho, he sent it the medication in using the electronic way. It messed up and he is doing it again. Contacted patient, unable to reach left message to give [place] a call back.

## 2018-04-03 NOTE — Assessment & Plan Note (Signed)
Trial of ANORO once daily instead of Symbicort -call us for prescription if this works better for you  You have to quit smoking completely

## 2018-04-04 ENCOUNTER — Telehealth: Payer: Self-pay | Admitting: Pulmonary Disease

## 2018-04-04 NOTE — Telephone Encounter (Signed)
Called and spoke with the patient he has been advised and nothing further is needed at this time.

## 2018-04-04 NOTE — Telephone Encounter (Signed)
Patient is calling requesting medication, I advised him that the message has been sent to Dr. Elsworth Soho and that he has been seeing patients in the office all day. He requests a call back once this has been sent to pharmacy, [place]

## 2018-04-04 NOTE — Telephone Encounter (Signed)
Routing to RA pre his request after verbal conversation.

## 2018-04-04 NOTE — Telephone Encounter (Signed)
Please let him know that when we did a narcotic review from the pharmacy, we found that he has been getting narcotics from different pharmacies and different prescribers.  I do not feel comfortable prescribing narcotics to him.  It is best if he establishes with a PCP and then gets prescriptions from one prescriber

## 2018-04-26 ENCOUNTER — Ambulatory Visit (INDEPENDENT_AMBULATORY_CARE_PROVIDER_SITE_OTHER): Payer: Medicare Other | Admitting: Internal Medicine

## 2018-04-26 ENCOUNTER — Encounter: Payer: Self-pay | Admitting: Internal Medicine

## 2018-04-26 ENCOUNTER — Other Ambulatory Visit (INDEPENDENT_AMBULATORY_CARE_PROVIDER_SITE_OTHER): Payer: Medicare Other

## 2018-04-26 VITALS — BP 124/80 | HR 89 | Temp 98.4°F | Resp 16 | Ht 73.0 in | Wt 182.5 lb

## 2018-04-26 DIAGNOSIS — Z23 Encounter for immunization: Secondary | ICD-10-CM | POA: Diagnosis not present

## 2018-04-26 DIAGNOSIS — Z1159 Encounter for screening for other viral diseases: Secondary | ICD-10-CM

## 2018-04-26 DIAGNOSIS — E890 Postprocedural hypothyroidism: Secondary | ICD-10-CM

## 2018-04-26 DIAGNOSIS — R972 Elevated prostate specific antigen [PSA]: Secondary | ICD-10-CM

## 2018-04-26 DIAGNOSIS — M150 Primary generalized (osteo)arthritis: Secondary | ICD-10-CM

## 2018-04-26 DIAGNOSIS — N400 Enlarged prostate without lower urinary tract symptoms: Secondary | ICD-10-CM

## 2018-04-26 DIAGNOSIS — E785 Hyperlipidemia, unspecified: Secondary | ICD-10-CM

## 2018-04-26 DIAGNOSIS — Z79891 Long term (current) use of opiate analgesic: Secondary | ICD-10-CM

## 2018-04-26 DIAGNOSIS — F1210 Cannabis abuse, uncomplicated: Secondary | ICD-10-CM

## 2018-04-26 DIAGNOSIS — E118 Type 2 diabetes mellitus with unspecified complications: Secondary | ICD-10-CM

## 2018-04-26 DIAGNOSIS — G894 Chronic pain syndrome: Secondary | ICD-10-CM

## 2018-04-26 DIAGNOSIS — Z0001 Encounter for general adult medical examination with abnormal findings: Secondary | ICD-10-CM | POA: Diagnosis not present

## 2018-04-26 DIAGNOSIS — I10 Essential (primary) hypertension: Secondary | ICD-10-CM | POA: Diagnosis not present

## 2018-04-26 DIAGNOSIS — M159 Polyosteoarthritis, unspecified: Secondary | ICD-10-CM | POA: Insufficient documentation

## 2018-04-26 DIAGNOSIS — Z0000 Encounter for general adult medical examination without abnormal findings: Secondary | ICD-10-CM | POA: Insufficient documentation

## 2018-04-26 DIAGNOSIS — Z1211 Encounter for screening for malignant neoplasm of colon: Secondary | ICD-10-CM | POA: Insufficient documentation

## 2018-04-26 LAB — COMPREHENSIVE METABOLIC PANEL
ALT: 13 U/L (ref 0–53)
AST: 12 U/L (ref 0–37)
Albumin: 4.5 g/dL (ref 3.5–5.2)
Alkaline Phosphatase: 95 U/L (ref 39–117)
BUN: 19 mg/dL (ref 6–23)
CO2: 23 mEq/L (ref 19–32)
Calcium: 9.2 mg/dL (ref 8.4–10.5)
Chloride: 107 mEq/L (ref 96–112)
Creatinine, Ser: 0.99 mg/dL (ref 0.40–1.50)
GFR: 80.57 mL/min (ref >60.00)
GLUCOSE: 102 mg/dL — AB (ref 70–99)
Potassium: 3.9 mEq/L (ref 3.5–5.1)
SODIUM: 141 meq/L (ref 135–145)
Total Bilirubin: 0.4 mg/dL (ref 0.2–1.2)
Total Protein: 7.3 g/dL (ref 6.0–8.3)

## 2018-04-26 LAB — URINALYSIS, ROUTINE W REFLEX MICROSCOPIC
BILIRUBIN URINE: NEGATIVE
Hgb urine dipstick: NEGATIVE
Ketones, ur: NEGATIVE
Nitrite: NEGATIVE
PH: 7 (ref 5.0–8.0)
RBC / HPF: NONE SEEN (ref 0–?)
Specific Gravity, Urine: 1.015 (ref 1.000–1.030)
Urine Glucose: NEGATIVE
Urobilinogen, UA: 0.2 (ref 0.0–1.0)

## 2018-04-26 LAB — LIPID PANEL
Cholesterol: 346 mg/dL — ABNORMAL HIGH (ref 0–200)
HDL: 49.1 mg/dL (ref >39.00)
LDL Cholesterol: 262 mg/dL — ABNORMAL HIGH (ref 0–99)
NonHDL: 296.87
Total CHOL/HDL Ratio: 7
Triglycerides: 173 mg/dL — ABNORMAL HIGH (ref 0.0–149.0)
VLDL: 34.6 mg/dL (ref 0.0–40.0)

## 2018-04-26 LAB — CBC WITH DIFFERENTIAL/PLATELET
Basophils Absolute: 0.2 10*3/uL — ABNORMAL HIGH (ref 0.0–0.1)
Basophils Relative: 1.6 % (ref 0.0–3.0)
EOS PCT: 1.9 % (ref 0.0–5.0)
Eosinophils Absolute: 0.2 10*3/uL (ref 0.0–0.7)
HCT: 41.8 % (ref 39.0–52.0)
Hemoglobin: 14.2 g/dL (ref 13.0–17.0)
Lymphocytes Relative: 22.9 % (ref 12.0–46.0)
Lymphs Abs: 2.3 10*3/uL (ref 0.7–4.0)
MCHC: 34 g/dL (ref 30.0–36.0)
MCV: 85.1 fl (ref 78.0–100.0)
Monocytes Absolute: 0.6 10*3/uL (ref 0.1–1.0)
Monocytes Relative: 6.1 % (ref 3.0–12.0)
Neutro Abs: 6.8 10*3/uL (ref 1.4–7.7)
Neutrophils Relative %: 67.5 % (ref 43.0–77.0)
Platelets: 355 10*3/uL (ref 150.0–400.0)
RBC: 4.92 Mil/uL (ref 4.22–5.81)
RDW: 15.5 % (ref 11.5–15.5)
WBC: 10 10*3/uL (ref 4.0–10.5)

## 2018-04-26 LAB — MICROALBUMIN / CREATININE URINE RATIO
Creatinine,U: 253.9 mg/dL
Microalb Creat Ratio: 1.3 mg/g (ref 0.0–30.0)
Microalb, Ur: 3.3 mg/dL — ABNORMAL HIGH (ref 0.0–1.9)

## 2018-04-26 LAB — TSH: TSH: 4.69 u[IU]/mL — ABNORMAL HIGH (ref 0.35–4.50)

## 2018-04-26 LAB — PSA: PSA: 4.32 ng/mL — ABNORMAL HIGH (ref 0.10–4.00)

## 2018-04-26 MED ORDER — HYDROCODONE-ACETAMINOPHEN 5-325 MG PO TABS: 1.0000 | ORAL_TABLET | Freq: Four times a day (QID) | ORAL | 0 refills | Status: DC | PRN

## 2018-04-26 MED ORDER — ROSUVASTATIN CALCIUM 20 MG PO TABS: 20.0000 mg | ORAL_TABLET | Freq: Every day | ORAL | 1 refills | Status: DC

## 2018-04-26 NOTE — Progress Notes (Signed)
Subjective:  Patient ID: Lee Mitchell, male    DOB: 1951-03-28  Age: 68 y.o. MRN: [id number]  CC: Annual Exam; Hypertension; Hypothyroidism; Hyperlipidemia; and Osteoarthritis  NEW TO ME  HPI Lee Mitchell presents for a CPX.  He comes in today to establish with a new PCP.  He complains of pain that alters his life and his ability to get through the day.  He tells me he has had multiple orthopedic surgeries on his large joints.  Over the last year he has been to the ED many times and received prescriptions for opiates.  He tries to control the pain with ibuprofen but does not get much symptom relief.  He has a history of DM 2.  He is not currently taking any thing to control the diabetes.  He denies polys.  He is very active and denies any recent episodes of CP, DOE, palpitations, edema, or fatigue.  History Lee Mitchell has a past medical history of Chronic abdominal pain, Colon polyps, PNA (pneumonia), and Thyroid cancer (Lee Mitchell).   He has a past surgical history that includes Thyroidectomy; Colonoscopy; Cholecystectomy; Ankle arthroscopy with reconstruction; Elbow arthroscopy with tendon reconstruction; Ulnar nerve transposition; and Shoulder arthroscopy with labral repair.   His family history includes Hypertension in his father and mother; Hyperthyroidism in his mother; Kidney cancer in his father; Thyroid cancer in his maternal uncle.He reports that he has been smoking cigarettes. He has a 8.75 pack-year smoking history. He has never used smokeless tobacco. He reports current alcohol use. He reports current drug use. Drug: Marijuana.  Outpatient Medications Prior to Visit  Medication Sig Dispense Refill  . levothyroxine (SYNTHROID, LEVOTHROID) 175 MCG tablet TAKE 1 TABLET BY MOUTH EVERY MORNING AT 6:30AM (BEFORE BREAKFAST).    . mirtazapine (REMERON SOL-TAB) 30 MG disintegrating tablet Take 30 mg by mouth at bedtime.    Marland Kitchen omeprazole (PRILOSEC) 40 MG capsule Take 40 mg by mouth 2  (two) times daily.    Marland Kitchen umeclidinium-vilanterol (ANORO ELLIPTA) 62.5-25 MCG/INH AEPB Inhale 1 puff into the lungs daily. 1 each 0  . levothyroxine (SYNTHROID, LEVOTHROID) 125 MCG tablet Take 125 mcg by mouth daily before breakfast.      No facility-administered medications prior to visit.     ROS Review of Systems  Constitutional: Negative.  Negative for appetite change, diaphoresis, fatigue, fever and unexpected weight change.  HENT: Negative.   Eyes: Negative for visual disturbance.  Respiratory: Negative for cough, chest tightness, shortness of breath and wheezing.   Cardiovascular: Negative for chest pain, palpitations and leg swelling.  Gastrointestinal: Negative for abdominal pain, blood in stool, constipation, diarrhea, nausea and vomiting.  Endocrine: Negative.  Negative for cold intolerance, heat intolerance, polydipsia, polyphagia and polyuria.  Genitourinary: Negative.  Negative for difficulty urinating, dysuria, enuresis, frequency, penile swelling, scrotal swelling, testicular pain and urgency.  Musculoskeletal: Positive for arthralgias. Negative for neck pain.  Skin: Negative.  Negative for color change.  Allergic/Immunologic: Negative.   Neurological: Negative.  Negative for dizziness, weakness, light-headedness and numbness.  Hematological: Negative for adenopathy. Does not bruise/bleed easily.  Psychiatric/Behavioral: Negative.  Negative for behavioral problems, dysphoric mood, sleep disturbance and suicidal ideas. The patient is not nervous/anxious.     Objective:  BP 124/80 (BP Location: Left Arm, Patient Position: Sitting, Cuff Size: Normal)   Pulse 89   Temp 98.4 F (36.9 C) (Oral)   Resp 16   Ht 6\' 1"  (1.854 m)   Wt 182 lb 8 oz (82.8 kg)   SpO2 98%  BMI 24.08 kg/m   Physical Exam Vitals signs reviewed.  Constitutional:      Appearance: Normal appearance. He is ill-appearing (winces in pain).  HENT:     Nose: Nose normal. No congestion.      Mouth/Throat:     Pharynx: Oropharynx is clear. No posterior oropharyngeal erythema.  Eyes:     General: No scleral icterus.    Conjunctiva/sclera: Conjunctivae normal.  Neck:     Musculoskeletal: Normal range of motion and neck supple. No muscular tenderness.  Cardiovascular:     Rate and Rhythm: Normal rate and regular rhythm.     Pulses: Normal pulses.     Heart sounds: No murmur. No gallop.      Comments: EKG ---  Sinus  Rhythm  Low voltage in limb leads.   -Incomplete right bundle branch block.   ABNORMAL - no change from the prior EKG  Pulmonary:     Effort: Pulmonary effort is normal.     Breath sounds: Normal breath sounds. No stridor. No wheezing, rhonchi or rales.  Abdominal:     General: Abdomen is flat. Bowel sounds are normal.     Palpations: Abdomen is soft. There is no hepatomegaly, splenomegaly or mass.     Tenderness: There is no abdominal tenderness.     Hernia: No hernia is present. There is no hernia in the right inguinal area or left inguinal area.  Genitourinary:    Pubic Area: No rash.      Penis: Normal and circumcised. No discharge, swelling or lesions.      Scrotum/Testes: Normal.        Right: Mass, tenderness or swelling not present.        Left: Tenderness or swelling not present.     Epididymis:     Right: Normal. Not inflamed or enlarged. No mass.     Left: Not inflamed or enlarged. No mass.     Prostate: Enlarged (2+ smooth symm BPH). Not tender and no nodules present.     Rectum: Normal. No mass, tenderness, anal fissure, external hemorrhoid or internal hemorrhoid. Normal anal tone.  Musculoskeletal: Normal range of motion.        General: No swelling or tenderness.     Right lower leg: No edema.     Left lower leg: No edema.  Lymphadenopathy:     Cervical: No cervical adenopathy.     Lower Body: No right inguinal adenopathy. No left inguinal adenopathy.  Skin:    General: Skin is warm and dry.     Coloration: Skin is not pale.      Findings: No erythema or rash.  Neurological:     General: No focal deficit present.     Mental Status: He is oriented to person, place, and time. Mental status is at baseline.  Psychiatric:        Mood and Affect: Mood normal.        Behavior: Behavior normal.        Thought Content: Thought content normal.        Judgment: Judgment normal.       Lab Results  Component Value Date   WBC 10.0 04/26/2018   HGB 14.2 04/26/2018   HCT 41.8 04/26/2018   PLT 355.0 04/26/2018   GLUCOSE 102 (H) 04/26/2018   CHOL 346 (H) 04/26/2018   TRIG 173.0 (H) 04/26/2018   HDL 49.10 04/26/2018   LDLCALC 262 (H) 04/26/2018   ALT 13 04/26/2018   AST 12 04/26/2018  NA 141 04/26/2018   K 3.9 04/26/2018   CL 107 04/26/2018   CREATININE 0.99 04/26/2018   BUN 19 04/26/2018   CO2 23 04/26/2018   TSH 4.69 (H) 04/26/2018   PSA 4.32 (H) 04/26/2018   HGBA1C 7.5 (H) 04/26/2018   MICROALBUR 3.3 (H) 04/26/2018    Visual Acuity Screening   Right eye Left eye Both eyes  Without correction:     With correction: 20/20 20/30 20/25   Hearing Screening Comments: Patient passed the whisper test    Assessment & Plan:   Axzel was seen today for annual exam, hypertension, hypothyroidism, hyperlipidemia and osteoarthritis.  Diagnoses and all orders for this visit:  Postoperative hypothyroidism- His TSH is mildly elevated at 4.69 but clinically he appears euthyroid.  Will continue the current dose of levothyroxine. -     CBC with Differential/Platelet; Future -     TSH; Future  Type II diabetes mellitus with manifestations ([place])- His A1c is up to 7.5%.  I have asked him to start taking an SGLT2 inhibitor to control his blood sugars. -     Ambulatory referral to Ophthalmology -     Comprehensive metabolic panel; Future -     Urinalysis, Routine w reflex microscopic; Future -     Microalbumin / creatinine urine ratio; Future -     HM Diabetes Foot Exam -     Hemoglobin A1c; Future -     dapagliflozin  propanediol (FARXIGA) 5 MG TABS tablet; Take 5 mg by mouth daily.  Hyperlipidemia LDL goal <100- He has an elevated ASCVD risk score so I have asked him to start taking a statin for CV risk reduction. -     Lipid panel; Future -     Comprehensive metabolic panel; Future -     rosuvastatin (CRESTOR) 20 MG tablet; Take 1 tablet (20 mg total) by mouth daily.  Routine general medical examination at a health care facility  Need for influenza vaccination -     Flu vaccine HIGH DOSE PF (Fluzone High dose)  Colon cancer screening -     Ambulatory referral to Gastroenterology  Encounter for long-term opiate analgesic use- His urine drug screen is positive for oxycodone.  I have asked him to explain where the oxycodone came from. -     Pain Mgmt, Profile 8 w/Conf, U; Future  Benign prostatic hyperplasia without lower urinary tract symptoms- His PSA is mildly elevated but he has no symptoms that need to be treated. -     PSA; Future  Need for hepatitis C screening test -     Hepatitis C antibody; Future  Chronic pain syndrome -     Ambulatory referral to Pain Clinic  Primary osteoarthritis involving multiple joints- For now. I will try to control his pain with hydrocodone and acetaminophen.  Based on his urine drug screen this is a complicated scenario so I have asked him to see someone in pain management for long-term management. -     HYDROcodone-acetaminophen (NORCO/VICODIN) 5-325 MG tablet; Take 1 tablet by mouth every 6 (six) hours as needed for moderate pain. -     Ambulatory referral to Pain Clinic  Hypertension, unspecified type- His blood pressure is adequately well controlled. -     EKG 12-Lead  PSA elevation- His PSA is very mildly elevated.  I have asked him return in about 3 to 4 months to recheck this.  If the PSA continues to climb then I will refer him to urology for prostate  cancer screening.  Cannabis abuse- He is not willing to stop using cannabis.   I am having Lee Mitchell start on HYDROcodone-acetaminophen, rosuvastatin, and dapagliflozin propanediol. I am also having him maintain his omeprazole, mirtazapine, umeclidinium-vilanterol, and levothyroxine.  Meds ordered this encounter  Medications  . HYDROcodone-acetaminophen (NORCO/VICODIN) 5-325 MG tablet    Sig: Take 1 tablet by mouth every 6 (six) hours as needed for moderate pain.    Dispense:  75 tablet    Refill:  0  . rosuvastatin (CRESTOR) 20 MG tablet    Sig: Take 1 tablet (20 mg total) by mouth daily.    Dispense:  90 tablet    Refill:  1  . dapagliflozin propanediol (FARXIGA) 5 MG TABS tablet    Sig: Take 5 mg by mouth daily.    Dispense:  90 tablet    Refill:  0     Follow-up: Return in about 3 months (around 07/26/2018).  Scarlette Calico, MD

## 2018-04-26 NOTE — Patient Instructions (Signed)

## 2018-04-27 ENCOUNTER — Other Ambulatory Visit: Payer: Medicare Other

## 2018-04-27 ENCOUNTER — Encounter: Payer: Self-pay | Admitting: Internal Medicine

## 2018-04-27 DIAGNOSIS — E118 Type 2 diabetes mellitus with unspecified complications: Secondary | ICD-10-CM

## 2018-04-27 LAB — HEPATITIS C ANTIBODY
Hepatitis C Ab: NONREACTIVE
SIGNAL TO CUT-OFF: 0.02 (ref <1.00)

## 2018-04-27 LAB — HEMOGLOBIN A1C: Hgb A1c MFr Bld: 7.5 % — ABNORMAL HIGH (ref 4.6–6.5)

## 2018-04-27 MED ORDER — DAPAGLIFLOZIN PROPANEDIOL 5 MG PO TABS: 5.0000 mg | ORAL_TABLET | Freq: Every day | ORAL | 0 refills | Status: DC

## 2018-04-29 ENCOUNTER — Encounter: Payer: Self-pay | Admitting: Internal Medicine

## 2018-04-29 DIAGNOSIS — F1210 Cannabis abuse, uncomplicated: Secondary | ICD-10-CM | POA: Insufficient documentation

## 2018-04-29 LAB — PAIN MGMT, PROFILE 8 W/CONF, U
6 Acetylmorphine: NEGATIVE ng/mL (ref <10)
Alcohol Metabolites: NEGATIVE ng/mL (ref <500)
Amphetamines: NEGATIVE ng/mL (ref <500)
Benzodiazepines: NEGATIVE ng/mL (ref <100)
Buprenorphine, Urine: NEGATIVE ng/mL (ref <5)
CREATININE: 262.3 mg/dL
Cocaine Metabolite: NEGATIVE ng/mL (ref <150)
MDMA: NEGATIVE ng/mL (ref <500)
Marijuana Metabolite: POSITIVE ng/mL — AB (ref <20)
Noroxycodone: NEGATIVE ng/mL (ref <50)
OXYCODONE: NEGATIVE ng/mL (ref <50)
Opiates: NEGATIVE ng/mL (ref <100)
Oxidant: NEGATIVE ug/mL (ref <200)
Oxycodone: POSITIVE ng/mL — AB (ref <100)
Oxymorphone: 511 ng/mL — ABNORMAL HIGH (ref <50)
pH: 6.71 (ref 4.5–9.0)

## 2018-04-29 NOTE — Assessment & Plan Note (Signed)
We discussed advanced planning and end-of-life decisions but he did not want not to make any of those decisions at this time.  The written screening recommendations is given to patient and attached in the patent instructions or AVS.   The patient is here for annual Medicare wellness examination and management of other chronic and acute problems.   The risk factors are reflected in the social history.  The roster of all physicians providing medical care to patient - is listed in the Snapshot section of the chart.  Activities of daily living:  The patient is 100% inedpendent in all ADLs: dressing, toileting, feeding as well as independent mobility  Home safety : The patient has smoke detectors in the home. They wear seatbelts.No firearms at home ( firearms are present in the home, kept in a safe fashion). There is no violence in the home.   There is no risks for hepatitis, STDs or HIV. There is no   history of blood transfusion. They have no travel history to infectious disease endemic areas of the world.  The patient has (has not) seen their dentist in the last six month. They have (not) seen their eye doctor in the last year. They deny (admit to) any hearing difficulty and have not had audiologic testing in the last year.  They do not  have excessive sun exposure. Discussed the need for sun protection: hats, long sleeves and use of sunscreen if there is significant sun exposure.   Diet: the importance of a healthy diet is discussed. They do have a healthy (unhealthy-high fat/fast food) diet.  The patient has a regular exercise program.  The benefits of regular aerobic exercise were discussed.  Depression screen: there are no signs or vegative symptoms of depression- irritability, change in appetite, anhedonia, sadness/tearfullness.  Cognitive assessment: the patient manages all their financial and personal affairs and is actively engaged. They could relate day,date,year and events; recalled  3/3 objects at 3 minutes; performed clock-face test normally.  The following portions of the patient's history were reviewed and updated as appropriate: allergies, current medications, past family history, past medical history,  past surgical history, past social history  and problem list.  Vision, hearing, body mass index were assessed and reviewed.   During the course of the visit the patient was educated and counseled about appropriate screening and preventive services including : fall prevention , diabetes screening, nutrition counseling, colorectal cancer screening, and recommended immunizations.

## 2018-05-01 ENCOUNTER — Other Ambulatory Visit: Payer: Self-pay | Admitting: Internal Medicine

## 2018-05-01 DIAGNOSIS — Z79891 Long term (current) use of opiate analgesic: Secondary | ICD-10-CM

## 2018-05-01 MED ORDER — NALOXONE HCL 4 MG/0.1ML NA LIQD: 1.0000 | Freq: Once | NASAL | 0 refills | Status: AC

## 2018-05-12 ENCOUNTER — Telehealth: Payer: Self-pay | Admitting: Internal Medicine

## 2018-05-12 DIAGNOSIS — M150 Primary generalized (osteo)arthritis: Principal | ICD-10-CM

## 2018-05-12 DIAGNOSIS — M159 Polyosteoarthritis, unspecified: Secondary | ICD-10-CM

## 2018-05-12 NOTE — Telephone Encounter (Signed)
Copied from [place] [id number]. Topic: Quick Communication - Rx Refill/Question >> May 12, 2018 10:25 AM Yvette Rack wrote: Medication:  HYDROcodone-acetaminophen (NORCO/VICODIN) 5-325 MG tablet   Has the patient contacted their pharmacy? no  Preferred Pharmacy (with phone number or street name): [id number] [address], [place] - [place] [phone] (Phone) [phone] (Fax)  Agent: Please be advised that RX refills may take up to 3 business days. We ask that you follow-up with your pharmacy.

## 2018-05-15 ENCOUNTER — Other Ambulatory Visit: Payer: Self-pay | Admitting: Internal Medicine

## 2018-05-15 DIAGNOSIS — G8929 Other chronic pain: Secondary | ICD-10-CM

## 2018-05-15 DIAGNOSIS — M25512 Pain in left shoulder: Principal | ICD-10-CM

## 2018-05-15 DIAGNOSIS — M25511 Pain in right shoulder: Principal | ICD-10-CM

## 2018-05-18 ENCOUNTER — Encounter: Payer: Self-pay | Admitting: Family Medicine

## 2018-05-18 ENCOUNTER — Telehealth: Payer: Self-pay

## 2018-05-18 ENCOUNTER — Ambulatory Visit (INDEPENDENT_AMBULATORY_CARE_PROVIDER_SITE_OTHER): Payer: Medicare Other | Admitting: Internal Medicine

## 2018-05-18 ENCOUNTER — Ambulatory Visit: Payer: Self-pay

## 2018-05-18 ENCOUNTER — Ambulatory Visit (INDEPENDENT_AMBULATORY_CARE_PROVIDER_SITE_OTHER): Payer: Medicare Other | Admitting: Family Medicine

## 2018-05-18 ENCOUNTER — Other Ambulatory Visit: Payer: Medicare Other

## 2018-05-18 ENCOUNTER — Encounter: Payer: Self-pay | Admitting: Internal Medicine

## 2018-05-18 VITALS — BP 124/80 | HR 66 | Temp 98.3°F | Resp 16 | Ht 73.0 in | Wt 183.0 lb

## 2018-05-18 VITALS — Ht 73.0 in

## 2018-05-18 DIAGNOSIS — M25512 Pain in left shoulder: Principal | ICD-10-CM

## 2018-05-18 DIAGNOSIS — T40605A Adverse effect of unspecified narcotics, initial encounter: Secondary | ICD-10-CM

## 2018-05-18 DIAGNOSIS — M75112 Incomplete rotator cuff tear or rupture of left shoulder, not specified as traumatic: Secondary | ICD-10-CM

## 2018-05-18 DIAGNOSIS — Z79891 Long term (current) use of opiate analgesic: Secondary | ICD-10-CM

## 2018-05-18 DIAGNOSIS — Z765 Malingerer [conscious simulation]: Secondary | ICD-10-CM | POA: Diagnosis not present

## 2018-05-18 DIAGNOSIS — G8929 Other chronic pain: Secondary | ICD-10-CM

## 2018-05-18 DIAGNOSIS — M150 Primary generalized (osteo)arthritis: Secondary | ICD-10-CM | POA: Diagnosis not present

## 2018-05-18 DIAGNOSIS — M75102 Unspecified rotator cuff tear or rupture of left shoulder, not specified as traumatic: Secondary | ICD-10-CM | POA: Insufficient documentation

## 2018-05-18 DIAGNOSIS — E118 Type 2 diabetes mellitus with unspecified complications: Secondary | ICD-10-CM | POA: Diagnosis not present

## 2018-05-18 DIAGNOSIS — M25511 Pain in right shoulder: Secondary | ICD-10-CM | POA: Diagnosis not present

## 2018-05-18 DIAGNOSIS — IMO0001 Reserved for inherently not codable concepts without codable children: Secondary | ICD-10-CM

## 2018-05-18 DIAGNOSIS — M159 Polyosteoarthritis, unspecified: Secondary | ICD-10-CM

## 2018-05-18 MED ORDER — PREDNISONE 50 MG PO TABS: 50.0000 mg | ORAL_TABLET | Freq: Every day | ORAL | 0 refills | Status: DC

## 2018-05-18 MED ORDER — EMPAGLIFLOZIN-METFORMIN HCL ER 10-1000 MG PO TB24: 1.0000 | ORAL_TABLET | Freq: Every day | ORAL | 0 refills | Status: DC
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
## 2018-05-18 NOTE — Progress Notes (Signed)
Subjective:  Patient ID: Lee Mitchell, male    DOB: 01-08-1951  Age: 68 y.o. MRN: [id number]  CC: Diabetes   HPI Lee Mitchell presents for f/up - He is not taking Lee Mitchell because he says it is too expensive.  He has not been checking his blood sugars.  He denies polys.   He requests a refill today on hydrocodone and wants to change it from the 5 mg dose to the 10 mg dose..  When I saw him 2 weeks ago I gave him a month's supply but he says he has already taken all of it.  Also, when I saw him he had a positive urine drug screen for oxycodone.  He now tells me that he continues to find oxycodone in his belongings such as his golfing bag and takes it intermittently when he runs out of hydrocodone.  Today he complains of worsening left shoulder pain over the last year.  Outpatient Medications Prior to Visit  Medication Sig Dispense Refill  . levothyroxine (SYNTHROID, LEVOTHROID) 175 MCG tablet TAKE 1 TABLET BY MOUTH EVERY MORNING AT 6:30AM (BEFORE BREAKFAST).    . mirtazapine (REMERON SOL-TAB) 30 MG disintegrating tablet Take 30 mg by mouth at bedtime.    Marland Kitchen omeprazole (PRILOSEC) 40 MG capsule Take 40 mg by mouth 2 (two) times daily.    . rosuvastatin (CRESTOR) 20 MG tablet Take 1 tablet (20 mg total) by mouth daily. 90 tablet 1  . umeclidinium-vilanterol (ANORO ELLIPTA) 62.5-25 MCG/INH AEPB Inhale 1 puff into the lungs daily. 1 each 0  . dapagliflozin propanediol (FARXIGA) 5 MG TABS tablet Take 5 mg by mouth daily. 90 tablet 0  . HYDROcodone-acetaminophen (NORCO/VICODIN) 5-325 MG tablet Take 1 tablet by mouth every 6 (six) hours as needed for moderate pain. 75 tablet 0   No facility-administered medications prior to visit.     ROS Review of Systems  Constitutional: Negative.  Negative for appetite change, diaphoresis, fatigue and unexpected weight change.  HENT: Negative.   Eyes: Negative for visual disturbance.  Respiratory: Negative for cough, chest tightness and shortness of  breath.   Cardiovascular: Negative for chest pain, palpitations and leg swelling.  Gastrointestinal: Negative for abdominal pain, constipation, diarrhea, nausea and vomiting.  Endocrine: Negative for polydipsia, polyphagia and polyuria.  Genitourinary: Negative for difficulty urinating.  Musculoskeletal: Positive for arthralgias. Negative for myalgias and neck pain.  Skin: Negative.  Negative for color change and pallor.  Allergic/Immunologic: Negative.   Neurological: Negative.  Negative for dizziness, weakness and light-headedness.  Hematological: Negative for adenopathy. Does not bruise/bleed easily.  Psychiatric/Behavioral: Negative.     Objective:  BP 124/80 (BP Location: Left Arm, Patient Position: Sitting, Cuff Size: Normal)   Pulse 66   Temp 98.3 F (36.8 C) (Oral)   Resp 16   Ht 6\' 1"  (1.854 m)   Wt 183 lb (83 kg)   SpO2 98%   BMI 24.14 kg/m   BP Readings from Last 3 Encounters:  05/18/18 124/80  04/26/18 124/80  04/03/18 118/72    Wt Readings from Last 3 Encounters:  05/18/18 183 lb (83 kg)  04/26/18 182 lb 8 oz (82.8 kg)  04/03/18 177 lb (80.3 kg)    Physical Exam Vitals signs reviewed.  Constitutional:      Appearance: He is not ill-appearing or diaphoretic.  HENT:     Nose: Nose normal. No congestion or rhinorrhea.     Mouth/Throat:     Pharynx: No oropharyngeal exudate.  Eyes:  General: No scleral icterus.    Conjunctiva/sclera: Conjunctivae normal.  Neck:     Musculoskeletal: Normal range of motion and neck supple. No muscular tenderness.  Cardiovascular:     Rate and Rhythm: Normal rate and regular rhythm.     Pulses: Normal pulses.     Heart sounds: No murmur. No gallop.   Pulmonary:     Effort: Pulmonary effort is normal.     Breath sounds: No stridor. No wheezing, rhonchi or rales.  Abdominal:     General: Abdomen is flat.     Palpations: There is mass.     Tenderness: There is no abdominal tenderness.  Musculoskeletal:         General: No swelling.     Left shoulder: He exhibits decreased range of motion and tenderness. He exhibits no bony tenderness, no swelling and no deformity.     Right lower leg: No edema.     Left lower leg: No edema.  Lymphadenopathy:     Cervical: No cervical adenopathy.  Skin:    General: Skin is warm and dry.     Coloration: Skin is not pale.     Findings: No erythema or rash.  Neurological:     General: No focal deficit present.     Mental Status: He is oriented to person, place, and time. Mental status is at baseline.     Lab Results  Component Value Date   WBC 10.0 04/26/2018   HGB 14.2 04/26/2018   HCT 41.8 04/26/2018   PLT 355.0 04/26/2018   GLUCOSE 102 (H) 04/26/2018   CHOL 346 (H) 04/26/2018   TRIG 173.0 (H) 04/26/2018   HDL 49.10 04/26/2018   LDLCALC 262 (H) 04/26/2018   ALT 13 04/26/2018   AST 12 04/26/2018   NA 141 04/26/2018   K 3.9 04/26/2018   CL 107 04/26/2018   CREATININE 0.99 04/26/2018   BUN 19 04/26/2018   CO2 23 04/26/2018   TSH 4.69 (H) 04/26/2018   PSA 4.32 (H) 04/26/2018   HGBA1C 7.5 (H) 04/26/2018   MICROALBUR 3.3 (H) 04/26/2018    No results found.  Assessment & Plan:   Lee Mitchell was seen today for diabetes.  Diagnoses and all orders for this visit:  Encounter for long-term opiate analgesic use- His urine drug screen is positive for cannabis and oxycodone. -     Pain Mgmt, Profile 8 w/Conf, U; Future  Type II diabetes mellitus with manifestations ([place])- His recent A1c was at 7.5%.  I have asked him to control his blood sugar with metformin and an SGLT2 inhibitor.  I gave him samples of Lee Mitchell. -     Empagliflozin-metFORMIN HCl ER (Lee Mitchell) 01-999 MG TB24; Take 1 tablet by mouth daily.  Nontraumatic incomplete tear of left rotator cuff- He saw sports medicine about this today.  Primary osteoarthritis involving multiple joints- I have encouraged him to see pain management.  Drug-seeking behavior- In the 2 weeks that I have  been taking care of him I have seen evidence of drug-seeking behavior.  Opiates and related narcotics causing adverse effect in therapeutic use, initial encounter- His urine drug screen continues to be positive for oxycodone so I have informed him that it would not be safe for me to add hydrocodone.  I have encouraged him to be seen in a pain management clinic and to consider being treated with Suboxone.   I have discontinued [name] "Tommy"'s HYDROcodone-acetaminophen and dapagliflozin propanediol. I am also having him start on  Empagliflozin-metFORMIN HCl ER. Additionally, I am having him maintain his omeprazole, mirtazapine, umeclidinium-vilanterol, levothyroxine, and rosuvastatin.  Meds ordered this encounter  Medications  . Empagliflozin-metFORMIN HCl ER (Lee Mitchell) 01-999 MG TB24    Sig: Take 1 tablet by mouth daily.    Dispense:  77 tablet    Refill:  0     Follow-up: Return in about 3 months (around 08/17/2018).  Scarlette Calico, MD

## 2018-05-18 NOTE — Telephone Encounter (Signed)
Pt states that the [place] medication is too expensive and wants to know if it can be changed.

## 2018-05-18 NOTE — Assessment & Plan Note (Signed)
Left rotator cuff tear.  Seems to be more possibly acute on chronic.  Difficult to assess for sure.  Patient has significant surgical interventions and compromise of the picture.  We discussed the possibility of x-rays and MRI.  Patient would like to follow-up with his surgeon.  Prednisone given for now.  Did not feel comfortable doing an injection with him not having significant improvement previously.  Patient will follow-up with me again if he would like to try PRP instead of surgical intervention.

## 2018-05-18 NOTE — Patient Instructions (Signed)
Good to see you  Ice is your friend Ice 20 minutes 2 times daily. Usually after activity and before bed. Prednisone daily for 7 days  Read about PRP  And then call us if you would liek to consider it

## 2018-05-18 NOTE — Patient Instructions (Signed)
Type 2 Diabetes Mellitus, Diagnosis, Adult Type 2 diabetes (type 2 diabetes mellitus) is a long-term (chronic) disease. In type 2 diabetes, one or both of these problems may be present:  The pancreas does not make enough of a hormone called insulin.  Cells in the body do not respond properly to insulin that the body makes (insulin resistance). Normally, insulin allows blood sugar (glucose) to enter cells in the body. The cells use glucose for energy. Insulin resistance or lack of insulin causes excess glucose to build up in the blood instead of going into cells. As a result, high blood glucose (hyperglycemia) develops. What increases the risk? The following factors may make you more likely to develop type 2 diabetes:  Having a family member with type 2 diabetes.  Being overweight or obese.  Having an inactive (sedentary) lifestyle.  Having been diagnosed with insulin resistance.  Having a history of prediabetes, gestational diabetes, or polycystic ovary syndrome (PCOS).  Being of American-Indian, African-American, Hispanic/Latino, or Asian/Pacific Islander descent. What are the signs or symptoms? In the early stage of this condition, you may not have symptoms. Symptoms develop slowly and may include:  Increased thirst (polydipsia).  Increased hunger(polyphagia).  Increased urination (polyuria).  Increased urination during the night (nocturia).  Unexplained weight loss.  Frequent infections that keep coming back (recurring).  Fatigue.  Weakness.  Vision changes, such as blurry vision.  Cuts or bruises that are slow to heal.  Tingling or numbness in the hands or feet.  Dark patches on the skin (acanthosis nigricans). How is this diagnosed? This condition is diagnosed based on your symptoms, your medical history, a physical exam, and your blood glucose level. Your blood glucose may be checked with one or more of the following blood tests:  A fasting blood glucose (FBG)  test. You will not be allowed to eat (you will fast) for 8 hours or longer before a blood sample is taken.  A random blood glucose test. This test checks blood glucose at any time of day regardless of when you ate.  An A1c (hemoglobin A1c) blood test. This test provides information about blood glucose control over the previous 2-3 months.  An oral glucose tolerance test (OGTT). This test measures your blood glucose at two times: ? After fasting. This is your baseline blood glucose level. ? Two hours after drinking a beverage that contains glucose. You may be diagnosed with type 2 diabetes if:  Your FBG level is 126 mg/dL (7.0 mmol/L) or higher.  Your random blood glucose level is 200 mg/dL (11.1 mmol/L) or higher.  Your A1c level is 6.5% or higher.  Your OGTT result is higher than 200 mg/dL (11.1 mmol/L). These blood tests may be repeated to confirm your diagnosis. How is this treated? Your treatment may be managed by a specialist called an endocrinologist. Type 2 diabetes may be treated by following instructions from your health care provider about:  Making diet and lifestyle changes. This may include: ? Following an individualized nutrition plan that is developed by a diet and nutrition specialist (registered dietitian). ? Exercising regularly. ? Finding ways to manage stress.  Checking your blood glucose level as often as told.  Taking diabetes medicines or insulin daily. This helps to keep your blood glucose levels in the healthy range. ? If you use insulin, you may need to adjust the dosage depending on how physically active you are and what foods you eat. Your health care provider will tell you how to adjust your dosage.    Taking medicines to help prevent complications from diabetes, such as: ? Aspirin. ? Medicine to lower cholesterol. ? Medicine to control blood pressure. Your health care provider will set individualized treatment goals for you. Your goals will be based on  your age, other medical conditions you have, and how you respond to diabetes treatment. Generally, the goal of treatment is to maintain the following blood glucose levels:  Before meals (preprandial): 80-130 mg/dL (4.4-7.2 mmol/L).  After meals (postprandial): below 180 mg/dL (10 mmol/L).  A1c level: less than 7%. Follow these instructions at home: Questions to ask your health care provider  Consider asking the following questions: ? Do I need to meet with a diabetes educator? ? Where can I find a support group for people with diabetes? ? What equipment will I need to manage my diabetes at home? ? What diabetes medicines do I need, and when should I take them? ? How often do I need to check my blood glucose? ? What number can I call if I have questions? ? When is my next appointment? General instructions  Take over-the-counter and prescription medicines only as told by your health care provider.  Keep all follow-up visits as told by your health care provider. This is important.  For more information about diabetes, visit: ? American Diabetes Association (ADA): www.diabetes.org ? American Association of Diabetes Educators (AADE): www.diabeteseducator.org Contact a health care provider if:  Your blood glucose is at or above 240 mg/dL (13.3 mmol/L) for 2 days in a row.  You have been sick or have had a fever for 2 days or longer, and you are not getting better.  You have any of the following problems for more than 6 hours: ? You cannot eat or drink. ? You have nausea and vomiting. ? You have diarrhea. Get help right away if:  Your blood glucose is lower than 54 mg/dL (3.0 mmol/L).  You become confused or you have trouble thinking clearly.  You have difficulty breathing.  You have moderate or large ketone levels in your urine. Summary  Type 2 diabetes (type 2 diabetes mellitus) is a long-term (chronic) disease. In type 2 diabetes, the pancreas does not make enough of a  hormone called insulin, or cells in the body do not respond properly to insulin that the body makes (insulin resistance).  This condition is treated by making diet and lifestyle changes and taking diabetes medicines or insulin.  Your health care provider will set individualized treatment goals for you. Your goals will be based on your age, other medical conditions you have, and how you respond to diabetes treatment.  Keep all follow-up visits as told by your health care provider. This is important. This information is not intended to replace advice given to you by your health care provider. Make sure you discuss any questions you have with your health care provider. Document Released: 04/05/2005 Document Revised: 11/04/2016 Document Reviewed: 05/09/2015 Elsevier Interactive Patient Education  2019 Elsevier Inc.  

## 2018-05-19 NOTE — Telephone Encounter (Signed)
Patient would like to know could this medication be re-filled. He is aware his test results are not back yet.

## 2018-05-21 ENCOUNTER — Encounter: Payer: Self-pay | Admitting: Internal Medicine

## 2018-05-21 DIAGNOSIS — IMO0001 Reserved for inherently not codable concepts without codable children: Secondary | ICD-10-CM | POA: Insufficient documentation

## 2018-05-21 DIAGNOSIS — Z765 Malingerer [conscious simulation]: Secondary | ICD-10-CM | POA: Insufficient documentation

## 2018-05-21 DIAGNOSIS — T40605A Adverse effect of unspecified narcotics, initial encounter: Secondary | ICD-10-CM

## 2018-05-21 LAB — PAIN MGMT, PROFILE 8 W/CONF, U
6 Acetylmorphine: NEGATIVE ng/mL (ref <10)
Alcohol Metabolites: NEGATIVE ng/mL (ref <500)
Amphetamines: NEGATIVE ng/mL (ref <500)
Benzodiazepines: NEGATIVE ng/mL (ref <100)
Buprenorphine, Urine: NEGATIVE ng/mL (ref <5)
CODEINE: NEGATIVE ng/mL (ref <50)
Cocaine Metabolite: NEGATIVE ng/mL (ref <150)
Creatinine: 179.5 mg/dL
Hydrocodone: NEGATIVE ng/mL (ref <50)
Hydromorphone: NEGATIVE ng/mL (ref <50)
MDMA: NEGATIVE ng/mL (ref <500)
MORPHINE: NEGATIVE ng/mL (ref <50)
Marijuana Metabolite: 25 ng/mL — ABNORMAL HIGH (ref <5)
Marijuana Metabolite: POSITIVE ng/mL — AB (ref <20)
Norhydrocodone: NEGATIVE ng/mL (ref <50)
Noroxycodone: 948 ng/mL — ABNORMAL HIGH (ref <50)
OXYCODONE: POSITIVE ng/mL — AB (ref <100)
Opiates: NEGATIVE ng/mL (ref <100)
Oxidant: NEGATIVE ug/mL (ref <200)
Oxycodone: 142 ng/mL — ABNORMAL HIGH (ref <50)
Oxymorphone: 1992 ng/mL — ABNORMAL HIGH (ref <50)
pH: 7.07 (ref 4.5–9.0)

## 2018-05-22 ENCOUNTER — Encounter: Payer: Self-pay | Admitting: Family Medicine

## 2018-05-22 ENCOUNTER — Ambulatory Visit (INDEPENDENT_AMBULATORY_CARE_PROVIDER_SITE_OTHER): Payer: Medicare Other | Admitting: Family Medicine

## 2018-05-22 VITALS — BP 112/74 | HR 91 | Ht 73.0 in | Wt 176.0 lb

## 2018-05-22 DIAGNOSIS — G894 Chronic pain syndrome: Secondary | ICD-10-CM

## 2018-05-22 DIAGNOSIS — M75102 Unspecified rotator cuff tear or rupture of left shoulder, not specified as traumatic: Secondary | ICD-10-CM | POA: Diagnosis not present

## 2018-05-22 NOTE — Patient Instructions (Addendum)
Good to see you  Ice is your friend We will get you in with Dr. Mardelle Matte on the shoulder  We will also get you in with Pain management.  We will not be able to do pain meds I am here if you have questions

## 2018-05-22 NOTE — Telephone Encounter (Signed)
Pt was sent MyChart message by Dr Ronnald Ramp with urine results and RX request.

## 2018-05-22 NOTE — Progress Notes (Signed)
[facility] [place] [place], [place] [address] Phone: [phone] Subjective:    I'm seeing this patient by the request  of:    CC: Left shoulder pain follow-up  [id number]    05/18/2018: Left rotator cuff tear.  Seems to be more possibly acute on chronic.  Difficult to assess for sure.  Patient has significant surgical interventions and compromise of the picture.  We discussed the possibility of x-rays and MRI.  Patient would like to follow-up with his surgeon.  Prednisone given for now.  Did not feel comfortable doing an injection with him not having significant improvement previously.  Patient will follow-up with me again if he would like to try PRP instead of surgical intervention.  Update 05/22/2018:  Lee Mitchell is a 68 y.o. male coming in with complaint of left shoulder pain. Patient states that his pain is not improving. He states that he woke up last night in excruciating pain in anterior shoulder. Is having trouble sleeping most nights. Prednisone did not have any effect on his pain.  Patient is wanting chronic narcotics.  Patient was seen by primary care provider who did a UDS and patient was positive for narcotics even though he stated he did not have any at that time.  Patient has been referred to pain management but has not gotten any type of call yet.     Past Medical History:  Diagnosis Date  . Chronic abdominal pain   . Colon polyps   . PNA (pneumonia)   . Thyroid cancer [facility]    Past Surgical History:  Procedure Laterality Date  . ANKLE ARTHROSCOPY WITH RECONSTRUCTION    . CHOLECYSTECTOMY    . COLONOSCOPY    . ELBOW ARTHROSCOPY WITH TENDON RECONSTRUCTION    . SHOULDER ARTHROSCOPY WITH LABRAL REPAIR    . THYROIDECTOMY     2004/2005  . ULNAR NERVE TRANSPOSITION     Social History   Socioeconomic History  . Marital status: Divorced    Spouse name: Not on file  . Number of children: 2  . Years of education: Not on file   . Highest education level: Not on file  Occupational History  . Occupation: event planner   Social Needs  . Financial resource strain: Not on file  . Food insecurity:    Worry: Not on file    Inability: Not on file  . Transportation needs:    Medical: Not on file    Non-medical: Not on file  Tobacco Use  . Smoking status: Current Every Day Smoker    Packs/day: 0.25    Years: 35.00    Pack years: 8.75    Types: Cigarettes  . Smokeless tobacco: Never Used  Substance and Sexual Activity  . Alcohol use: Not Currently    Alcohol/week: 0.0 standard drinks  . Drug use: Yes    Types: Marijuana  . Sexual activity: Not Currently  Lifestyle  . Physical activity:    Days per week: Not on file    Minutes per session: Not on file  . Stress: Not on file  Relationships  . Social connections:    Talks on phone: Not on file    Gets together: Not on file    Attends religious service: Not on file    Active member of club or organization: Not on file    Attends meetings of clubs or organizations: Not on file    Relationship status: Not on file  Other Topics Concern  .  Not on file  Social History Narrative  . Not on file   Allergies  Allergen Reactions  . Morphine And Related     Headaches   Family History  Problem Relation Age of Onset  . Kidney cancer Father   . Hypertension Father   . Hypertension Mother   . Hyperthyroidism Mother   . Cancer Mother 64       thyroid cancer  . Thyroid cancer Maternal Uncle     Current Outpatient Medications (Endocrine & Metabolic):  Marland Kitchen  Empagliflozin-metFORMIN HCl ER (SYNJARDY XR) 01-999 MG TB24, Take 1 tablet by mouth daily. Marland Kitchen  levothyroxine (SYNTHROID, LEVOTHROID) 175 MCG tablet, TAKE 1 TABLET BY MOUTH EVERY MORNING AT 6:30AM (BEFORE BREAKFAST). .  predniSONE (DELTASONE) 50 MG tablet, Take 1 tablet (50 mg total) by mouth daily.  Current Outpatient Medications (Cardiovascular):  .  rosuvastatin (CRESTOR) 20 MG tablet, Take 1 tablet (20  mg total) by mouth daily.  Current Outpatient Medications (Respiratory):  .  umeclidinium-vilanterol (ANORO ELLIPTA) 62.5-25 MCG/INH AEPB, Inhale 1 puff into the lungs daily.    Current Outpatient Medications (Other):  .  mirtazapine (REMERON SOL-TAB) 30 MG disintegrating tablet, Take 30 mg by mouth at bedtime. Marland Kitchen  omeprazole (PRILOSEC) 40 MG capsule, Take 40 mg by mouth 2 (two) times daily.    Past medical history, social, surgical and family history all reviewed in electronic medical record.  No pertanent information unless stated regarding to the chief complaint.   Review of Systems:  No headache, visual changes, nausea, vomiting, diarrhea, constipation, dizziness, abdominal pain, skin rash, fevers, chills, night sweats, weight loss, swollen lymph nodes,  chest pain, shortness of breath, mood changes.   Objective  Blood pressure 112/74, pulse 91, height 6\' 1"  (1.854 m), weight 176 lb (79.8 kg), SpO2 97 %.    General: No apparent distress alert and oriented x3 mood and affect normal, dressed appropriately.  HEENT: Pupils equal, extraocular movements intact  Respiratory: Patient's speak in full sentences and does not appear short of breath  Cardiovascular: No lower extremity edema, non tender, no erythema  Skin: Warm dry intact with no signs of infection or rash on extremities or on axial skeleton.  Abdomen: Soft nontender  Neuro: Cranial nerves II through XII are intact, neurovascularly intact in all extremities with 2+ DTRs and 2+ pulses.  Lymph: No lymphadenopathy of posterior or anterior cervical chain or axillae bilaterally.  Gait normal with good balance and coordination.  MSK: Mild tender with full range of motion and good stability and symmetric strength and tone of  elbows, wrist, hip, knee and ankles bilaterally.  Left shoulder exam does have weakness of the rotator cuff in all planes.  Patient does have mild crepitus noted as well.  Patient did not allow me to do full  testing secondary to discomfort and pain.    Impression and Recommendations:     The above documentation has been reviewed and is accurate and complete Hulan Saas, DO.      Note: This dictation was prepared with Dragon dictation along with smaller phrase technology. Any transcriptional errors that result from this process are unintentional.

## 2018-05-22 NOTE — Assessment & Plan Note (Signed)
Patient's ultrasound and patient symptoms are consistent with a rotator cuff tear.  Patient was adamant he did not want any type of injection today.  We discussed Toradol to help with pain but declined giving him any pain medications.  Patient did see primary care provider and failed UDS and has been sent to the pain medicine clinic.  We will repeat referral to pain medicine to see if we can speed up the process.  I do believe that patient shoulder is hurting secondary to rotator cuff tear and will refer to orthopedic surgery to discuss possible revision.  Patient does have some osteoarthritic changes and possible further imaging such as an MRI or even CT scan may be necessary.  Patient will follow-up with me more on an as-needed basis.  Spent  25 minutes with patient face-to-face and had greater than 50% of counseling including as described above in assessment and plan.

## 2018-05-22 NOTE — Assessment & Plan Note (Signed)
Referred to pain management.

## 2018-05-24 ENCOUNTER — Encounter: Payer: Self-pay | Admitting: Family Medicine

## 2018-05-24 DIAGNOSIS — M25512 Pain in left shoulder: Secondary | ICD-10-CM | POA: Diagnosis not present

## 2018-05-31 ENCOUNTER — Encounter: Payer: Self-pay | Admitting: Internal Medicine

## 2018-06-01 ENCOUNTER — Encounter: Payer: Self-pay | Admitting: Family Medicine

## 2018-06-06 ENCOUNTER — Encounter: Payer: Self-pay | Admitting: *Deleted

## 2018-06-14 ENCOUNTER — Telehealth: Payer: Self-pay | Admitting: Emergency Medicine

## 2018-06-14 DIAGNOSIS — Z79899 Other long term (current) drug therapy: Secondary | ICD-10-CM | POA: Diagnosis not present

## 2018-06-14 DIAGNOSIS — Z79891 Long term (current) use of opiate analgesic: Secondary | ICD-10-CM | POA: Diagnosis not present

## 2018-06-14 DIAGNOSIS — G894 Chronic pain syndrome: Secondary | ICD-10-CM | POA: Diagnosis not present

## 2018-06-14 DIAGNOSIS — M545 Low back pain: Secondary | ICD-10-CM | POA: Diagnosis not present

## 2018-06-14 DIAGNOSIS — M25519 Pain in unspecified shoulder: Secondary | ICD-10-CM | POA: Diagnosis not present

## 2018-06-14 MED ORDER — LEVOTHYROXINE SODIUM 175 MCG PO TABS: 175.0000 ug | ORAL_TABLET | Freq: Every day | ORAL | 1 refills | Status: DC

## 2018-06-14 NOTE — Telephone Encounter (Signed)
erx sent as requested.  

## 2018-06-14 NOTE — Telephone Encounter (Signed)
Pt asked for a refill on his levothyroxine (SYNTHROID, LEVOTHROID) 175 MCG tablet. [facility] [address]. Thanks.

## 2018-06-15 ENCOUNTER — Encounter: Payer: Self-pay | Admitting: Internal Medicine

## 2018-06-16 NOTE — Telephone Encounter (Signed)
Synjardy was sent in.

## 2018-06-20 DIAGNOSIS — M79609 Pain in unspecified limb: Secondary | ICD-10-CM | POA: Diagnosis not present

## 2018-06-20 DIAGNOSIS — M545 Low back pain: Secondary | ICD-10-CM | POA: Diagnosis not present

## 2018-06-28 DIAGNOSIS — M549 Dorsalgia, unspecified: Secondary | ICD-10-CM | POA: Diagnosis not present

## 2018-06-28 DIAGNOSIS — R45851 Suicidal ideations: Secondary | ICD-10-CM | POA: Insufficient documentation

## 2018-06-28 DIAGNOSIS — F32A Depression, unspecified: Secondary | ICD-10-CM | POA: Insufficient documentation

## 2018-07-05 DIAGNOSIS — Z79899 Other long term (current) drug therapy: Secondary | ICD-10-CM | POA: Diagnosis not present

## 2018-07-05 DIAGNOSIS — G8929 Other chronic pain: Secondary | ICD-10-CM | POA: Diagnosis not present

## 2018-07-05 DIAGNOSIS — Z590 Homelessness: Secondary | ICD-10-CM | POA: Diagnosis not present

## 2018-07-06 DIAGNOSIS — F6089 Other specific personality disorders: Secondary | ICD-10-CM | POA: Insufficient documentation

## 2018-07-11 DIAGNOSIS — R6889 Other general symptoms and signs: Secondary | ICD-10-CM | POA: Diagnosis not present

## 2018-07-11 DIAGNOSIS — Z79891 Long term (current) use of opiate analgesic: Secondary | ICD-10-CM | POA: Diagnosis not present

## 2018-07-11 DIAGNOSIS — M792 Neuralgia and neuritis, unspecified: Secondary | ICD-10-CM | POA: Diagnosis not present

## 2018-07-11 DIAGNOSIS — Z79899 Other long term (current) drug therapy: Secondary | ICD-10-CM | POA: Diagnosis not present

## 2018-07-11 DIAGNOSIS — G894 Chronic pain syndrome: Secondary | ICD-10-CM | POA: Diagnosis not present

## 2018-07-12 ENCOUNTER — Telehealth: Payer: Self-pay

## 2018-07-12 ENCOUNTER — Other Ambulatory Visit: Payer: Self-pay | Admitting: Internal Medicine

## 2018-07-12 DIAGNOSIS — Z114 Encounter for screening for human immunodeficiency virus [HIV]: Secondary | ICD-10-CM | POA: Insufficient documentation

## 2018-07-12 DIAGNOSIS — N400 Enlarged prostate without lower urinary tract symptoms: Secondary | ICD-10-CM

## 2018-07-12 DIAGNOSIS — E890 Postprocedural hypothyroidism: Secondary | ICD-10-CM

## 2018-07-12 NOTE — Telephone Encounter (Signed)
Copied from [place]. Topic: General - Inquiry >> Jul 11, 2018  2:47 PM Virl Axe D wrote: Reason for CRM: Pt would like to know if he is due to have his thyroid checked again. Also with Covid-19 and his past with pneumonia? Pt is a bit anxious. Please advise pt if he needs to have labs done soon or if they can wait.

## 2018-07-12 NOTE — Telephone Encounter (Signed)
Labs ordered.

## 2018-07-12 NOTE — Telephone Encounter (Signed)
Pt is due for PSA recheck. Do want pt to come to lab and have that rechecked or can that lab wait a bit?

## 2018-07-12 NOTE — Telephone Encounter (Signed)
Pt informed labs have been entered.  

## 2018-07-13 DIAGNOSIS — M25559 Pain in unspecified hip: Secondary | ICD-10-CM | POA: Diagnosis not present

## 2018-07-13 DIAGNOSIS — G894 Chronic pain syndrome: Secondary | ICD-10-CM | POA: Diagnosis not present

## 2018-07-13 DIAGNOSIS — R6889 Other general symptoms and signs: Secondary | ICD-10-CM | POA: Diagnosis not present

## 2018-07-13 DIAGNOSIS — M47816 Spondylosis without myelopathy or radiculopathy, lumbar region: Secondary | ICD-10-CM | POA: Diagnosis not present

## 2018-07-13 DIAGNOSIS — M25519 Pain in unspecified shoulder: Secondary | ICD-10-CM | POA: Diagnosis not present

## 2018-07-14 ENCOUNTER — Other Ambulatory Visit (INDEPENDENT_AMBULATORY_CARE_PROVIDER_SITE_OTHER): Payer: Medicare Other

## 2018-07-14 DIAGNOSIS — E890 Postprocedural hypothyroidism: Secondary | ICD-10-CM

## 2018-07-14 DIAGNOSIS — N400 Enlarged prostate without lower urinary tract symptoms: Secondary | ICD-10-CM

## 2018-07-14 DIAGNOSIS — Z114 Encounter for screening for human immunodeficiency virus [HIV]: Secondary | ICD-10-CM

## 2018-07-14 DIAGNOSIS — M25512 Pain in left shoulder: Secondary | ICD-10-CM | POA: Diagnosis not present

## 2018-07-14 LAB — TSH: TSH: 3.54 u[IU]/mL (ref 0.35–4.50)

## 2018-07-17 ENCOUNTER — Other Ambulatory Visit: Payer: Self-pay | Admitting: Internal Medicine

## 2018-07-17 ENCOUNTER — Encounter: Payer: Self-pay | Admitting: Internal Medicine

## 2018-07-17 DIAGNOSIS — R972 Elevated prostate specific antigen [PSA]: Secondary | ICD-10-CM

## 2018-07-17 LAB — HIV ANTIBODY (ROUTINE TESTING W REFLEX): HIV 1&2 Ab, 4th Generation: NONREACTIVE

## 2018-07-17 LAB — PSA, TOTAL AND FREE
PSA, % Free: 10 % (calc) — ABNORMAL LOW (ref >25)
PSA, Free: 0.5 ng/mL
PSA, TOTAL: 4.8 ng/mL — AB (ref <=4.0)

## 2018-07-17 LAB — PSA: PSA: 4.8

## 2018-07-19 ENCOUNTER — Encounter: Payer: Self-pay | Admitting: Internal Medicine

## 2018-07-20 ENCOUNTER — Encounter: Payer: Self-pay | Admitting: Internal Medicine

## 2018-07-24 DIAGNOSIS — M47817 Spondylosis without myelopathy or radiculopathy, lumbosacral region: Secondary | ICD-10-CM | POA: Diagnosis not present

## 2018-07-24 DIAGNOSIS — M5136 Other intervertebral disc degeneration, lumbar region: Secondary | ICD-10-CM | POA: Diagnosis not present

## 2018-07-24 DIAGNOSIS — M5416 Radiculopathy, lumbar region: Secondary | ICD-10-CM | POA: Diagnosis not present

## 2018-08-07 ENCOUNTER — Other Ambulatory Visit: Payer: Self-pay | Admitting: Internal Medicine

## 2018-08-07 ENCOUNTER — Encounter: Payer: Self-pay | Admitting: Internal Medicine

## 2018-08-07 DIAGNOSIS — F3341 Major depressive disorder, recurrent, in partial remission: Secondary | ICD-10-CM

## 2018-08-07 MED ORDER — MIRTAZAPINE 30 MG PO TBDP: 30.0000 mg | ORAL_TABLET | Freq: Every day | ORAL | 1 refills | Status: DC

## 2018-08-07 NOTE — Telephone Encounter (Signed)
Spoke to pt and he states he is going to need a refill on his mirtazapine (REMERON SOL-TAB) 30 MG disintegrating tablet [[id number]]  [facility] [address]  It was last prescribed by his doctor in [place] and it has no more refills.    He stated he is also out of refills on the omeprazole but he's not quite ready for a refill at this time.  I advised he call us back when he is ready.

## 2018-08-10 DIAGNOSIS — M47816 Spondylosis without myelopathy or radiculopathy, lumbar region: Secondary | ICD-10-CM | POA: Diagnosis not present

## 2018-08-10 DIAGNOSIS — R3911 Hesitancy of micturition: Secondary | ICD-10-CM | POA: Diagnosis not present

## 2018-08-10 DIAGNOSIS — M25519 Pain in unspecified shoulder: Secondary | ICD-10-CM | POA: Diagnosis not present

## 2018-08-10 DIAGNOSIS — M25559 Pain in unspecified hip: Secondary | ICD-10-CM | POA: Diagnosis not present

## 2018-08-10 DIAGNOSIS — Z79891 Long term (current) use of opiate analgesic: Secondary | ICD-10-CM | POA: Diagnosis not present

## 2018-08-10 DIAGNOSIS — Z79899 Other long term (current) drug therapy: Secondary | ICD-10-CM | POA: Diagnosis not present

## 2018-08-10 DIAGNOSIS — G894 Chronic pain syndrome: Secondary | ICD-10-CM | POA: Diagnosis not present

## 2018-08-10 LAB — PSA: PSA: 3.41

## 2018-08-17 ENCOUNTER — Encounter: Payer: Self-pay | Admitting: Internal Medicine

## 2018-08-21 DIAGNOSIS — M5136 Other intervertebral disc degeneration, lumbar region: Secondary | ICD-10-CM | POA: Diagnosis not present

## 2018-08-21 DIAGNOSIS — M47817 Spondylosis without myelopathy or radiculopathy, lumbosacral region: Secondary | ICD-10-CM | POA: Diagnosis not present

## 2018-08-21 DIAGNOSIS — M5126 Other intervertebral disc displacement, lumbar region: Secondary | ICD-10-CM | POA: Diagnosis not present

## 2018-08-21 DIAGNOSIS — M5416 Radiculopathy, lumbar region: Secondary | ICD-10-CM | POA: Diagnosis not present

## 2018-09-07 DIAGNOSIS — Z79891 Long term (current) use of opiate analgesic: Secondary | ICD-10-CM | POA: Diagnosis not present

## 2018-09-07 DIAGNOSIS — Z79899 Other long term (current) drug therapy: Secondary | ICD-10-CM | POA: Diagnosis not present

## 2018-09-07 DIAGNOSIS — G894 Chronic pain syndrome: Secondary | ICD-10-CM | POA: Diagnosis not present

## 2018-09-07 DIAGNOSIS — M25559 Pain in unspecified hip: Secondary | ICD-10-CM | POA: Diagnosis not present

## 2018-09-07 DIAGNOSIS — M47816 Spondylosis without myelopathy or radiculopathy, lumbar region: Secondary | ICD-10-CM | POA: Diagnosis not present

## 2018-09-07 DIAGNOSIS — M19012 Primary osteoarthritis, left shoulder: Secondary | ICD-10-CM | POA: Diagnosis not present

## 2018-09-07 DIAGNOSIS — M545 Low back pain: Secondary | ICD-10-CM | POA: Diagnosis not present

## 2018-09-20 ENCOUNTER — Other Ambulatory Visit: Payer: Self-pay

## 2018-09-20 ENCOUNTER — Emergency Department (HOSPITAL_COMMUNITY): Payer: Medicare Other

## 2018-09-20 ENCOUNTER — Emergency Department (HOSPITAL_BASED_OUTPATIENT_CLINIC_OR_DEPARTMENT_OTHER): Payer: Medicare Other

## 2018-09-20 ENCOUNTER — Encounter (HOSPITAL_COMMUNITY): Payer: Self-pay | Admitting: *Deleted

## 2018-09-20 ENCOUNTER — Emergency Department (HOSPITAL_COMMUNITY)
Admission: EM | Admit: 2018-09-20 | Discharge: 2018-09-20 | Disposition: A | Discharge status: Home / Self Care | Payer: Medicare Other | Attending: Emergency Medicine | Admitting: Emergency Medicine

## 2018-09-20 DIAGNOSIS — M79609 Pain in unspecified limb: Secondary | ICD-10-CM

## 2018-09-20 DIAGNOSIS — E039 Hypothyroidism, unspecified: Secondary | ICD-10-CM | POA: Diagnosis not present

## 2018-09-20 DIAGNOSIS — F17210 Nicotine dependence, cigarettes, uncomplicated: Secondary | ICD-10-CM | POA: Insufficient documentation

## 2018-09-20 DIAGNOSIS — Z7984 Long term (current) use of oral hypoglycemic drugs: Secondary | ICD-10-CM | POA: Insufficient documentation

## 2018-09-20 DIAGNOSIS — I10 Essential (primary) hypertension: Secondary | ICD-10-CM | POA: Insufficient documentation

## 2018-09-20 DIAGNOSIS — R609 Edema, unspecified: Secondary | ICD-10-CM | POA: Diagnosis not present

## 2018-09-20 DIAGNOSIS — Z79899 Other long term (current) drug therapy: Secondary | ICD-10-CM | POA: Diagnosis not present

## 2018-09-20 DIAGNOSIS — E119 Type 2 diabetes mellitus without complications: Secondary | ICD-10-CM | POA: Diagnosis not present

## 2018-09-20 DIAGNOSIS — Z85850 Personal history of malignant neoplasm of thyroid: Secondary | ICD-10-CM | POA: Insufficient documentation

## 2018-09-20 DIAGNOSIS — M79662 Pain in left lower leg: Secondary | ICD-10-CM | POA: Diagnosis present

## 2018-09-20 DIAGNOSIS — I82432 Acute embolism and thrombosis of left popliteal vein: Secondary | ICD-10-CM

## 2018-09-20 DIAGNOSIS — M545 Low back pain: Secondary | ICD-10-CM | POA: Diagnosis not present

## 2018-09-20 DIAGNOSIS — M549 Dorsalgia, unspecified: Secondary | ICD-10-CM | POA: Diagnosis not present

## 2018-09-20 MED ORDER — FENTANYL CITRATE (PF) 100 MCG/2ML IJ SOLN
50.0000 ug | Freq: Once | INTRAMUSCULAR | Status: AC
  Administered 2018-09-20: 50 ug via INTRAMUSCULAR
  Filled 2018-09-20: qty 2

## 2018-09-20 MED ORDER — PREDNISONE 20 MG PO TABS
60.0000 mg | ORAL_TABLET | Freq: Once | ORAL | Status: AC
  Administered 2018-09-20: 60 mg via ORAL
  Filled 2018-09-20: qty 3

## 2018-09-20 MED ORDER — FENTANYL CITRATE (PF) 100 MCG/2ML IJ SOLN
50.0000 ug | Freq: Once | INTRAMUSCULAR | Status: DC
  Filled 2018-09-20: qty 2

## 2018-09-20 MED ORDER — OXYCODONE-ACETAMINOPHEN 5-325 MG PO TABS: 2.0000 | ORAL_TABLET | Freq: Four times a day (QID) | ORAL | 0 refills | Status: AC | PRN

## 2018-09-20 MED ORDER — MORPHINE SULFATE (PF) 4 MG/ML IV SOLN
4.0000 mg | Freq: Once | INTRAVENOUS | Status: AC
  Administered 2018-09-20: 4 mg via INTRAMUSCULAR
  Filled 2018-09-20: qty 1

## 2018-09-20 MED ORDER — RIVAROXABAN (XARELTO) EDUCATION KIT FOR DVT/PE PATIENTS
PACK | Freq: Once | Status: AC
  Administered 2018-09-20: 14:00:00
  Filled 2018-09-20: qty 1

## 2018-09-20 MED ORDER — RIVAROXABAN (XARELTO) VTE STARTER PACK (15 & 20 MG): ORAL_TABLET | ORAL | 0 refills | Status: DC

## 2018-09-20 MED FILL — XARELTO STARTER PACK: 15 & 20 | 30 days supply | Qty: 51 | Fill #0

## 2018-09-20 NOTE — ED Provider Notes (Signed)
[facility] EMERGENCY DEPARTMENT Provider Note   CSN: [id number] Arrival date & time: 09/20/18  6578    History   Chief Complaint Chief Complaint  Patient presents with   Back Pain   Leg Pain    HPI Lee Mitchell is a 68 y.o. male.     Patient is a 68 year old gentleman with past medical history of smoking, elevated cholesterol, thyroid cancer who presents to the emergency department for left lower extremity pain.  Patient reports that he has a lot of pain in his calf.  The pain shoots up from his calf into his buttock and down into the feet.  Reports that he does have a history of bulging disks, as well as sciatica of the right leg.  He reports that he has never had any symptoms into the left leg.  Reports that this feels different than his sciatica pain and he actually thinks that he might have a blood clot.  Denies any chest pain, shortness of breath.     Past Medical History:  Diagnosis Date   Chronic abdominal pain    Colon polyps    PNA (pneumonia)    Thyroid cancer [facility])     Patient Active Problem List   Diagnosis Date Noted   Encounter for screening for HIV 07/12/2018   Drug-seeking behavior 05/21/2018   Opiates and related narcotics causing adverse effect in therapeutic use, initial encounter 05/21/2018   Left rotator cuff tear 05/18/2018   Cannabis abuse 04/29/2018   Postoperative hypothyroidism 04/26/2018   Type II diabetes mellitus with manifestations ([place]) 04/26/2018   Hyperlipidemia LDL goal <100 04/26/2018   Routine general medical examination at a health care facility 04/26/2018   Primary osteoarthritis involving multiple joints 04/26/2018   PSA elevation 04/26/2018   Hypertension 04/26/2018   Benign prostatic hyperplasia without lower urinary tract symptoms 04/26/2018   Encounter for long-term opiate analgesic use 04/26/2018   Colon cancer screening 04/26/2018   Centrilobular emphysema ([place]) 04/03/2018    MDD (major depressive disorder), recurrent episode, severe ([place]) 02/15/2017   Chronic pain syndrome 02/15/2017   GERD 03/22/2010    Past Surgical History:  Procedure Laterality Date   ANKLE ARTHROSCOPY WITH RECONSTRUCTION     CHOLECYSTECTOMY     COLONOSCOPY     ELBOW ARTHROSCOPY WITH TENDON RECONSTRUCTION     SHOULDER ARTHROSCOPY WITH LABRAL REPAIR     THYROIDECTOMY     2004/2005   ULNAR NERVE TRANSPOSITION          Home Medications    Prior to Admission medications   Medication Sig Start Date End Date Taking? Authorizing Provider  Empagliflozin-metFORMIN HCl ER (SYNJARDY XR) 01-999 MG TB24 Take 1 tablet by mouth daily. 05/18/18   [name], MD  levothyroxine (SYNTHROID, LEVOTHROID) 175 MCG tablet Take 1 tablet (175 mcg total) by mouth daily before breakfast. 06/14/18   [name], MD  mirtazapine (REMERON SOL-TAB) 30 MG disintegrating tablet Take 1 tablet (30 mg total) by mouth at bedtime. 08/07/18   [name], MD  omeprazole (PRILOSEC) 40 MG capsule Take 40 mg by mouth 2 (two) times daily.    [provider]  oxyCODONE-acetaminophen (PERCOCET/ROXICET) 5-325 MG tablet Take 2 tablets by mouth every 6 (six) hours as needed for up to 3 days for severe pain. 09/20/18 09/23/18  Alveria Apley, PA-C  Rivaroxaban 15 & 20 MG TBPK Take as directed on package: 09/20/18   Madilyn Hook A, PA-C  rosuvastatin (CRESTOR) 20 MG tablet  Take 1 tablet (20 mg total) by mouth daily. 04/26/18   [name], MD  umeclidinium-vilanterol (ANORO ELLIPTA) 62.5-25 MCG/INH AEPB Inhale 1 puff into the lungs daily. 04/03/18   Rigoberto Noel, MD    Family History Family History  Problem Relation Age of Onset   Kidney cancer Father    Hypertension Father    Hypertension Mother    Hyperthyroidism Mother    Cancer Mother 2       thyroid cancer   Thyroid cancer Maternal Uncle     Social History Social History   Tobacco Use   Smoking status: Current Every Day  Smoker    Packs/day: 0.25    Years: 35.00    Pack years: 8.75    Types: Cigarettes   Smokeless tobacco: Never Used  Substance Use Topics   Alcohol use: Not Currently    Alcohol/week: 0.0 standard drinks   Drug use: Yes    Types: Marijuana     Allergies   Morphine and related   Review of Systems Review of Systems  Constitutional: Negative for fever.  Respiratory: Negative for cough and shortness of breath.   Cardiovascular: Positive for leg swelling. Negative for chest pain.  Gastrointestinal: Negative for abdominal pain, nausea and vomiting.  Musculoskeletal: Positive for back pain.  Skin: Negative for color change, rash and wound.  Neurological: Negative for dizziness, light-headedness and headaches.  All other systems reviewed and are negative.    Physical Exam Updated Vital Signs BP 133/90 (BP Location: Right Arm)    Pulse 97    Temp 98.6 F (37 C) (Oral)    Resp 16    Wt 79.8 kg    SpO2 97%    BMI 23.22 kg/m   Physical Exam Vitals signs and nursing note reviewed.  Constitutional:      Appearance: Normal appearance.  HENT:     Head: Normocephalic.  Eyes:     Conjunctiva/sclera: Conjunctivae normal.  Pulmonary:     Effort: Pulmonary effort is normal.  Musculoskeletal:     Comments: There is mild swelling in the left ankle compared to the right ankle.  Normal distal pulses.  Tenderness to palpation in the posterior calf on the left side.  Skin:    General: Skin is warm and dry.  Neurological:     Mental Status: He is alert.     Sensory: No sensory deficit.     Motor: No weakness.     Deep Tendon Reflexes: Reflexes normal.     Comments: Patient favors the left leg.  Psychiatric:        Mood and Affect: Mood normal.      ED Treatments / Results  Labs (all labs ordered are listed, but only abnormal results are displayed) Labs Reviewed - No data to display  EKG None  Radiology Dg Lumbar Spine 2-3 Views  Result Date: 09/20/2018 CLINICAL DATA:   Low back pain radiating to the left leg. EXAM: LUMBAR SPINE - 2-3 VIEW COMPARISON:  CT abdomen 01/02/2015 FINDINGS: Five lumbar type vertebral bodies without malalignment. Disc space narrowing most pronounced at L 3 4 and L4-5, to a lesser degree at L5-S1. Mild lower lumbar facet osteoarthritis. No evidence of fracture or focal bone lesion. Sacroiliac joints appear unremarkable. IMPRESSION: Ordinary mild lumbar degenerative changes by radiography. Electronically Signed   By: Nelson Chimes M.D.   On: 09/20/2018 10:29   Vas [place] Lower Extremity Venous (dvt) (only [facility])  Result Date: 09/20/2018  Lower  Venous Study Indications: Left lower extremity pain and edema.  Risk Factors: Cancer History of thyroid and esoph. Performing Technologist: Toma Copier RVS  Examination Guidelines: A complete evaluation includes B-mode imaging, spectral Doppler, color Doppler, and power Doppler as needed of all accessible portions of each vessel. Bilateral testing is considered an integral part of a complete examination. Limited examinations for reoccurring indications may be performed as noted.  +-----+---------------+---------+-----------+----------+-------+  RIGHT Compressibility Phasicity Spontaneity Properties Summary  +-----+---------------+---------+-----------+----------+-------+  CFV   Full            Yes       Yes                             +-----+---------------+---------+-----------+----------+-------+  SFJ   Full                                                      +-----+---------------+---------+-----------+----------+-------+   +---------+---------------+---------+-----------+----------+-------+  LEFT      Compressibility Phasicity Spontaneity Properties Summary  +---------+---------------+---------+-----------+----------+-------+  CFV       Full            Yes       Yes                             +---------+---------------+---------+-----------+----------+-------+  SFJ       Full                                                       +---------+---------------+---------+-----------+----------+-------+  FV Prox   Full            Yes       Yes                             +---------+---------------+---------+-----------+----------+-------+  FV Mid    Full                                                      +---------+---------------+---------+-----------+----------+-------+  FV Distal Full            Yes       Yes                             +---------+---------------+---------+-----------+----------+-------+  PFV       Full            Yes       Yes                             +---------+---------------+---------+-----------+----------+-------+  POP       None            No        No  Acute    +---------+---------------+---------+-----------+----------+-------+  PTV       Partial                                          Acute    +---------+---------------+---------+-----------+----------+-------+  PERO      None                                             Acute    +---------+---------------+---------+-----------+----------+-------+     Summary: Right: There is no evidence of a common femoral vein obstruction Left: Findings consistent with acute deep vein thrombosis involving the left popliteal vein, left posterior tibial vein, and left peroneal vein.  *See table(s) above for measurements and observations.    Preliminary     Procedures Procedures (including critical care time)  Medications Ordered in ED Medications  rivaroxaban (XARELTO) Education Kit for DVT/PE patients (has no administration in time range)  fentaNYL (SUBLIMAZE) injection 50 mcg (50 mcg Intramuscular Given 09/20/18 1009)  predniSONE (DELTASONE) tablet 60 mg (60 mg Oral Given 09/20/18 1008)  morphine 4 MG/ML injection 4 mg (4 mg Intramuscular Given 09/20/18 1201)     Initial Impression / Assessment and Plan / ED Course  I have reviewed the triage vital signs and the nursing notes.  Pertinent labs & imaging results that were  available during my care of the patient were reviewed by me and considered in my medical decision making (see chart for details).  Clinical Course as of Sep 19 1324  Wed Sep 20, 2018  1323 Patient found to have DVT in the left lower extremity.  May be provoked by being rather immobile through quarantine.  He does have history of smoking and elevated cholesterol.  No signs of cerulea dolens.  We will give patient a starter pack of Xarelto with educational kit from our pharmacist here.  We will also send him home with some pain medication.  He is advised to follow-up with his primary care doctor.   [KM]    Clinical Course User Index [KM] Alveria Apley, PA-C         Final Clinical Impressions(s) / ED Diagnoses   Final diagnoses:  Acute deep vein thrombosis (DVT) of popliteal vein of left lower extremity [facility])    ED Discharge Orders         Ordered    oxyCODONE-acetaminophen (PERCOCET/ROXICET) 5-325 MG tablet  Every 6 hours PRN     09/20/18 1320    Rivaroxaban 15 & 20 MG TBPK     09/20/18 1322           Kristine Royal 09/20/18 1326    Drenda Freeze, MD 09/20/18 [phone]

## 2018-09-20 NOTE — Progress Notes (Signed)
Left lower extremity venous duplex completed. Preliminary results in Chart review CV Proc. [place] Joseeduardo Brix,RVS 09/20/2018, 1:09 PM

## 2018-09-20 NOTE — ED Notes (Signed)
Wasted 175mcgs fentanyl in sharps with Mali G., RN at [phone]. Pt name was unable to be found in pyxis at time of medication waste.

## 2018-09-20 NOTE — Discharge Instructions (Addendum)
Thank you for allowing me to care for you today. Please return to the emergency department if you have new or worsening symptoms. Take your medications as instructed.  ° °

## 2018-09-20 NOTE — ED Triage Notes (Signed)
To ED for eval lower back pain with radiation down left leg. Pt states he has a known bulging disc but pain is typically down right leg. Has had back injections years ago. Now with left leg pain and numbness. Pain is severe.

## 2018-09-20 NOTE — ED Notes (Signed)
Patient transported to X-ray 

## 2018-09-22 DIAGNOSIS — I824Z2 Acute embolism and thrombosis of unspecified deep veins of left distal lower extremity: Secondary | ICD-10-CM | POA: Diagnosis not present

## 2018-09-29 ENCOUNTER — Encounter: Payer: Self-pay | Admitting: Internal Medicine

## 2018-09-29 NOTE — Telephone Encounter (Signed)
Dr. Sharlet Salina ok'd early refill called pharmacy spoke w/shannon gave authorization for early refill.Marland KitchenJohny Chess

## 2018-09-29 NOTE — Telephone Encounter (Signed)
MD is out of the office pls advise../lmb 

## 2018-10-11 DIAGNOSIS — I824Z2 Acute embolism and thrombosis of unspecified deep veins of left distal lower extremity: Secondary | ICD-10-CM | POA: Diagnosis not present

## 2018-10-19 DIAGNOSIS — Z6822 Body mass index (BMI) 22.0-22.9, adult: Secondary | ICD-10-CM | POA: Diagnosis not present

## 2018-10-19 DIAGNOSIS — M5431 Sciatica, right side: Secondary | ICD-10-CM | POA: Diagnosis not present

## 2018-10-19 DIAGNOSIS — M4726 Other spondylosis with radiculopathy, lumbar region: Secondary | ICD-10-CM | POA: Diagnosis not present

## 2018-10-23 ENCOUNTER — Other Ambulatory Visit: Payer: Self-pay | Admitting: Orthopedic Surgery

## 2018-10-23 ENCOUNTER — Other Ambulatory Visit: Payer: Self-pay

## 2018-10-23 ENCOUNTER — Ambulatory Visit
Admission: RE | Admit: 2018-10-23 | Discharge: 2018-10-23 | Disposition: A | Discharge status: Home / Self Care | Payer: Medicare Other | Source: Ambulatory Visit | Attending: Orthopedic Surgery | Admitting: Orthopedic Surgery

## 2018-10-23 DIAGNOSIS — M4726 Other spondylosis with radiculopathy, lumbar region: Secondary | ICD-10-CM

## 2018-10-23 DIAGNOSIS — M48061 Spinal stenosis, lumbar region without neurogenic claudication: Secondary | ICD-10-CM | POA: Diagnosis not present

## 2018-10-24 DIAGNOSIS — M5116 Intervertebral disc disorders with radiculopathy, lumbar region: Secondary | ICD-10-CM | POA: Diagnosis not present

## 2018-10-24 DIAGNOSIS — M4726 Other spondylosis with radiculopathy, lumbar region: Secondary | ICD-10-CM | POA: Diagnosis not present

## 2018-10-24 DIAGNOSIS — M5431 Sciatica, right side: Secondary | ICD-10-CM | POA: Diagnosis not present

## 2018-10-27 DIAGNOSIS — M5412 Radiculopathy, cervical region: Secondary | ICD-10-CM | POA: Diagnosis not present

## 2018-10-27 DIAGNOSIS — Z79899 Other long term (current) drug therapy: Secondary | ICD-10-CM | POA: Diagnosis not present

## 2018-10-27 DIAGNOSIS — M542 Cervicalgia: Secondary | ICD-10-CM | POA: Diagnosis not present

## 2018-10-27 DIAGNOSIS — Z79891 Long term (current) use of opiate analgesic: Secondary | ICD-10-CM | POA: Diagnosis not present

## 2018-10-27 DIAGNOSIS — M5416 Radiculopathy, lumbar region: Secondary | ICD-10-CM | POA: Diagnosis not present

## 2018-10-27 DIAGNOSIS — M4726 Other spondylosis with radiculopathy, lumbar region: Secondary | ICD-10-CM | POA: Diagnosis not present

## 2018-10-27 DIAGNOSIS — G894 Chronic pain syndrome: Secondary | ICD-10-CM | POA: Diagnosis not present

## 2018-10-30 ENCOUNTER — Other Ambulatory Visit: Payer: Self-pay | Admitting: Orthopedic Surgery

## 2018-10-30 DIAGNOSIS — M4726 Other spondylosis with radiculopathy, lumbar region: Secondary | ICD-10-CM

## 2018-11-13 DIAGNOSIS — M5416 Radiculopathy, lumbar region: Secondary | ICD-10-CM | POA: Diagnosis not present

## 2018-11-15 DIAGNOSIS — I824Z2 Acute embolism and thrombosis of unspecified deep veins of left distal lower extremity: Secondary | ICD-10-CM | POA: Diagnosis not present

## 2018-11-24 DIAGNOSIS — M4726 Other spondylosis with radiculopathy, lumbar region: Secondary | ICD-10-CM | POA: Diagnosis not present

## 2018-11-24 DIAGNOSIS — M5416 Radiculopathy, lumbar region: Secondary | ICD-10-CM | POA: Diagnosis not present

## 2018-11-28 DIAGNOSIS — M4726 Other spondylosis with radiculopathy, lumbar region: Secondary | ICD-10-CM | POA: Diagnosis not present

## 2018-11-28 DIAGNOSIS — M5116 Intervertebral disc disorders with radiculopathy, lumbar region: Secondary | ICD-10-CM | POA: Diagnosis not present

## 2018-11-28 DIAGNOSIS — M5431 Sciatica, right side: Secondary | ICD-10-CM | POA: Diagnosis not present

## 2018-11-28 DIAGNOSIS — Z6822 Body mass index (BMI) 22.0-22.9, adult: Secondary | ICD-10-CM | POA: Diagnosis not present

## 2018-12-14 ENCOUNTER — Encounter: Payer: Self-pay | Admitting: Internal Medicine

## 2018-12-14 ENCOUNTER — Other Ambulatory Visit: Payer: Self-pay

## 2018-12-14 ENCOUNTER — Encounter (HOSPITAL_COMMUNITY): Payer: Self-pay | Admitting: Family Medicine

## 2018-12-14 ENCOUNTER — Ambulatory Visit (INDEPENDENT_AMBULATORY_CARE_PROVIDER_SITE_OTHER): Payer: Medicare Other | Admitting: Internal Medicine

## 2018-12-14 ENCOUNTER — Other Ambulatory Visit (INDEPENDENT_AMBULATORY_CARE_PROVIDER_SITE_OTHER): Payer: Medicare Other

## 2018-12-14 VITALS — BP 140/78 | HR 100 | Temp 98.2°F | Resp 16 | Ht 73.0 in | Wt 176.8 lb

## 2018-12-14 DIAGNOSIS — E785 Hyperlipidemia, unspecified: Secondary | ICD-10-CM

## 2018-12-14 DIAGNOSIS — Z23 Encounter for immunization: Secondary | ICD-10-CM

## 2018-12-14 DIAGNOSIS — E890 Postprocedural hypothyroidism: Secondary | ICD-10-CM

## 2018-12-14 DIAGNOSIS — I82462 Acute embolism and thrombosis of left calf muscular vein: Secondary | ICD-10-CM | POA: Diagnosis not present

## 2018-12-14 DIAGNOSIS — R0609 Other forms of dyspnea: Secondary | ICD-10-CM | POA: Insufficient documentation

## 2018-12-14 DIAGNOSIS — R000 Tachycardia, unspecified: Secondary | ICD-10-CM

## 2018-12-14 DIAGNOSIS — I10 Essential (primary) hypertension: Secondary | ICD-10-CM

## 2018-12-14 DIAGNOSIS — Z1211 Encounter for screening for malignant neoplasm of colon: Secondary | ICD-10-CM

## 2018-12-14 DIAGNOSIS — E118 Type 2 diabetes mellitus with unspecified complications: Secondary | ICD-10-CM

## 2018-12-14 DIAGNOSIS — R9431 Abnormal electrocardiogram [ECG] [EKG]: Secondary | ICD-10-CM | POA: Insufficient documentation

## 2018-12-14 DIAGNOSIS — R972 Elevated prostate specific antigen [PSA]: Secondary | ICD-10-CM

## 2018-12-14 LAB — BASIC METABOLIC PANEL
BUN: 12 mg/dL (ref 6–23)
CO2: 22 mEq/L (ref 19–32)
Calcium: 8.9 mg/dL (ref 8.4–10.5)
Chloride: 103 mEq/L (ref 96–112)
Creatinine, Ser: 1 mg/dL (ref 0.40–1.50)
GFR: 74.78 mL/min (ref >60.00)
Glucose, Bld: 117 mg/dL — ABNORMAL HIGH (ref 70–99)
Potassium: 3.7 mEq/L (ref 3.5–5.1)
Sodium: 136 mEq/L (ref 135–145)

## 2018-12-14 LAB — POCT GLYCOSYLATED HEMOGLOBIN (HGB A1C): Hemoglobin A1C: 6.5 % — AB (ref 4.0–5.6)

## 2018-12-14 LAB — LIPID PANEL
Cholesterol: 299 mg/dL — ABNORMAL HIGH (ref 0–200)
HDL: 34.8 mg/dL — ABNORMAL LOW (ref >39.00)
LDL Cholesterol: 225 mg/dL — ABNORMAL HIGH (ref 0–99)
NonHDL: 263.96
Total CHOL/HDL Ratio: 9
Triglycerides: 194 mg/dL — ABNORMAL HIGH (ref 0.0–149.0)
VLDL: 38.8 mg/dL (ref 0.0–40.0)

## 2018-12-14 LAB — TSH: TSH: 0.41 u[IU]/mL (ref 0.35–4.50)

## 2018-12-14 MED ORDER — LIVALO 4 MG PO TABS: 1.0000 | ORAL_TABLET | Freq: Every day | ORAL | 0 refills | Status: DC

## 2018-12-14 NOTE — Patient Instructions (Signed)
Hypothyroidism  Hypothyroidism is when the thyroid gland does not make enough of certain hormones (it is underactive). The thyroid gland is a small gland located in the lower front part of the neck, just in front of the windpipe (trachea). This gland makes hormones that help control how the body uses food for energy (metabolism) as well as how the heart and brain function. These hormones also play a role in keeping your bones strong. When the thyroid is underactive, it produces too little of the hormones thyroxine (T4) and triiodothyronine (T3). What are the causes? This condition may be caused by:  Hashimoto's disease. This is a disease in which the body's disease-fighting system (immune system) attacks the thyroid gland. This is the most common cause.  Viral infections.  Pregnancy.  Certain medicines.  Birth defects.  Past radiation treatments to the head or neck for cancer.  Past treatment with radioactive iodine.  Past exposure to radiation in the environment.  Past surgical removal of part or all of the thyroid.  Problems with a gland in the center of the brain (pituitary gland).  Lack of enough iodine in the diet. What increases the risk? You are more likely to develop this condition if:  You are male.  You have a family history of thyroid conditions.  You use a medicine called lithium.  You take medicines that affect the immune system (immunosuppressants). What are the signs or symptoms? Symptoms of this condition include:  Feeling as though you have no energy (lethargy).  Not being able to tolerate cold.  Weight gain that is not explained by a change in diet or exercise habits.  Lack of appetite.  Dry skin.  Coarse hair.  Menstrual irregularity.  Slowing of thought processes.  Constipation.  Sadness or depression. How is this diagnosed? This condition may be diagnosed based on:  Your symptoms, your medical history, and a physical exam.  Blood  tests. You may also have imaging tests, such as an ultrasound or MRI. How is this treated? This condition is treated with medicine that replaces the thyroid hormones that your body does not make. After you begin treatment, it may take several weeks for symptoms to go away. Follow these instructions at home:  Take over-the-counter and prescription medicines only as told by your health care provider.  If you start taking any new medicines, tell your health care provider.  Keep all follow-up visits as told by your health care provider. This is important. ? As your condition improves, your dosage of thyroid hormone medicine may change. ? You will need to have blood tests regularly so that your health care provider can monitor your condition. Contact a health care provider if:  Your symptoms do not get better with treatment.  You are taking thyroid replacement medicine and you: ? Sweat a lot. ? Have tremors. ? Feel anxious. ? Lose weight rapidly. ? Cannot tolerate heat. ? Have emotional swings. ? Have diarrhea. ? Feel weak. Get help right away if you have:  Chest pain.  An irregular heartbeat.  A rapid heartbeat.  Difficulty breathing. Summary  Hypothyroidism is when the thyroid gland does not make enough of certain hormones (it is underactive).  When the thyroid is underactive, it produces too little of the hormones thyroxine (T4) and triiodothyronine (T3).  The most common cause is Hashimoto's disease, a disease in which the body's disease-fighting system (immune system) attacks the thyroid gland. The condition can also be caused by viral infections, medicine, pregnancy, or past   radiation treatment to the head or neck.  Symptoms may include weight gain, dry skin, constipation, feeling as though you do not have energy, and not being able to tolerate cold.  This condition is treated with medicine to replace the thyroid hormones that your body does not make. This information  is not intended to replace advice given to you by your health care provider. Make sure you discuss any questions you have with your health care provider. Document Released: 04/05/2005 Document Revised: 03/18/2017 Document Reviewed: 03/16/2017 Elsevier Patient Education  2020 [employer]  

## 2018-12-14 NOTE — Progress Notes (Signed)
Subjective:  Patient ID: Lee Mitchell, male    DOB: 08-26-1950  Age: 68 y.o. MRN: [id number]  CC: Hypothyroidism, Diabetes, Hyperlipidemia, and Hypertension   HPI Lee Mitchell presents for f/up - He comes in for preop visit.  It sounds like he is going to have a lumbar fusion in the near future.  He continues to complain of shortness of breath both with and without exertion.  He denies any recent episodes of chest pain, diaphoresis, lightheadedness, or dizziness.  He is not taking rosuvastatin because he thought it caused muscle aches.  He had a DVT about 2 months ago and is taking a DOAC.  He says the pain and swelling in his left leg has resolved.  He is not taking Synjardy because it was too expensive.  Outpatient Medications Prior to Visit  Medication Sig Dispense Refill  . cyclobenzaprine (FLEXERIL) 10 MG tablet     . mirtazapine (REMERON SOL-TAB) 30 MG disintegrating tablet Take 1 tablet (30 mg total) by mouth at bedtime. 90 tablet 1  . omeprazole (PRILOSEC) 40 MG capsule Take 40 mg by mouth 2 (two) times daily.    Marland Kitchen oxyCODONE-acetaminophen (PERCOCET) 7.5-325 MG tablet     . umeclidinium-vilanterol (ANORO ELLIPTA) 62.5-25 MCG/INH AEPB Inhale 1 puff into the lungs daily. 1 each 0  . XARELTO 20 MG TABS tablet     . levothyroxine (SYNTHROID, LEVOTHROID) 175 MCG tablet Take 1 tablet (175 mcg total) by mouth daily before breakfast. 90 tablet 1  . Empagliflozin-metFORMIN HCl ER (SYNJARDY XR) 01-999 MG TB24 Take 1 tablet by mouth daily. (Patient not taking: Reported on 12/14/2018) 77 tablet 0  . Rivaroxaban 15 & 20 MG TBPK Take as directed on package: 51 each 0  . rosuvastatin (CRESTOR) 20 MG tablet Take 1 tablet (20 mg total) by mouth daily. 90 tablet 1   No facility-administered medications prior to visit.     ROS Review of Systems  Constitutional: Negative for diaphoresis and fatigue.  HENT: Negative.   Eyes: Negative for visual disturbance.  Respiratory: Positive for  shortness of breath. Negative for cough, chest tightness, wheezing and stridor.   Cardiovascular: Negative for chest pain, palpitations and leg swelling.  Gastrointestinal: Negative for abdominal pain, constipation, diarrhea and nausea.  Endocrine: Negative.  Negative for cold intolerance and heat intolerance.  Genitourinary: Negative.  Negative for difficulty urinating and dysuria.  Musculoskeletal: Positive for arthralgias and back pain. Negative for myalgias.  Skin: Negative.  Negative for color change and pallor.  Neurological: Negative.  Negative for dizziness, weakness, light-headedness and headaches.  Hematological: Negative for adenopathy. Does not bruise/bleed easily.  Psychiatric/Behavioral: Negative.     Objective:  BP 140/78 (BP Location: Left Arm, Patient Position: Sitting, Cuff Size: Normal)   Pulse 100   Temp 98.2 F (36.8 C) (Oral)   Resp 16   Ht 6\' 1"  (1.854 m)   Wt 176 lb 12 oz (80.2 kg)   SpO2 96%   BMI 23.32 kg/m   BP Readings from Last 3 Encounters:  12/14/18 140/78  09/20/18 133/90  05/22/18 112/74    Wt Readings from Last 3 Encounters:  12/14/18 176 lb 12 oz (80.2 kg)  09/20/18 176 lb (79.8 kg)  05/22/18 176 lb (79.8 kg)    Physical Exam Vitals signs reviewed.  Constitutional:      Appearance: He is normal weight. He is not ill-appearing or diaphoretic.  HENT:     Nose: Nose normal.     Mouth/Throat:  Mouth: Mucous membranes are moist.  Eyes:     General: No scleral icterus.    Conjunctiva/sclera: Conjunctivae normal.  Neck:     Musculoskeletal: Normal range of motion. No neck rigidity or muscular tenderness.  Cardiovascular:     Rate and Rhythm: Normal rate and regular rhythm.     Heart sounds: No murmur. No gallop.      Comments: EKG ---  Sinus  Rhythm  Low voltage in limb leads.   -Left axis.   Poor SNR (< 1) in leads  V1  ABNORMAL - no change from the prior EKG Pulmonary:     Effort: Pulmonary effort is normal.     Breath  sounds: Examination of the right-upper field reveals decreased breath sounds. Examination of the left-upper field reveals decreased breath sounds. Examination of the right-middle field reveals decreased breath sounds. Examination of the left-middle field reveals decreased breath sounds. Examination of the right-lower field reveals decreased breath sounds. Examination of the left-lower field reveals decreased breath sounds. Decreased breath sounds present. No wheezing, rhonchi or rales.  Abdominal:     General: Abdomen is flat. Bowel sounds are normal.     Palpations: There is no hepatomegaly, splenomegaly or mass.     Tenderness: There is no abdominal tenderness.  Musculoskeletal: Normal range of motion.     Right lower leg: No edema.     Left lower leg: No edema.  Lymphadenopathy:     Cervical: No cervical adenopathy.  Skin:    General: Skin is warm and dry.     Coloration: Skin is not pale.  Neurological:     General: No focal deficit present.     Mental Status: He is alert.  Psychiatric:        Mood and Affect: Mood normal.        Behavior: Behavior normal.     Lab Results  Component Value Date   WBC 10.0 04/26/2018   HGB 14.2 04/26/2018   HCT 41.8 04/26/2018   PLT 355.0 04/26/2018   GLUCOSE 117 (H) 12/14/2018   CHOL 299 (H) 12/14/2018   TRIG 194.0 (H) 12/14/2018   HDL 34.80 (L) 12/14/2018   LDLCALC 225 (H) 12/14/2018   ALT 13 04/26/2018   AST 12 04/26/2018   NA 136 12/14/2018   K 3.7 12/14/2018   CL 103 12/14/2018   CREATININE 1.00 12/14/2018   BUN 12 12/14/2018   CO2 22 12/14/2018   TSH 0.41 12/14/2018   PSA 3.410 08/10/2018   HGBA1C 6.5 (A) 12/14/2018   MICROALBUR 3.3 (H) 04/26/2018    Mr Lumbar Spine Wo Contrast  Result Date: 10/23/2018 CLINICAL DATA:  Low back pain with pain radiating down the right buttock EXAM: MRI LUMBAR SPINE WITHOUT CONTRAST TECHNIQUE: Multiplanar, multisequence MR imaging of the lumbar spine was performed. No intravenous contrast was  administered. COMPARISON:  None. FINDINGS: Segmentation:  Standard. Alignment:  2 mm retrolisthesis of L4 on L5. Vertebrae:  No fracture, evidence of discitis, or bone lesion. Conus medullaris and cauda equina: Conus extends to the T12 level. Conus and cauda equina appear normal. Paraspinal and other soft tissues: No acute paraspinal abnormality. Disc levels: Disc spaces: Degenerative disease with disc height loss at L1-2, L3-4 and L4-5. T12-L1: No significant disc bulge. No evidence of neural foraminal stenosis. No central canal stenosis. L1-L2: Mild broad-based disc bulge. No evidence of neural foraminal stenosis. No central canal stenosis. L2-L3: Broad-based disc bulge. No evidence of neural foraminal stenosis. No central canal stenosis. L3-L4: Broad-based  disc bulge with a small left paracentral disc protrusion contacting the left intraspinal L4 nerve root. Mild bilateral facet arthropathy. No evidence of neural foraminal stenosis. No central canal stenosis. L4-L5: Broad-based disc bulge. Moderate bilateral facet arthropathy. Bilateral lateral recess narrowing. No evidence of neural foraminal stenosis. No central canal stenosis. L5-S1: Mild broad-based disc bulge. Moderate bilateral facet arthropathy. Mild right foraminal stenosis. No left foraminal stenosis. No central canal stenosis. IMPRESSION: 1. Lumbar spine spondylosis as described above. 2. At L3-4 there is a broad-based disc bulge with a small left paracentral disc protrusion contacting the left intraspinal L4 nerve root. Mild bilateral facet arthropathy. Electronically Signed   By: Kathreen Devoid   On: 10/23/2018 12:18    Assessment & Plan:   Tarran was seen today for hypothyroidism, diabetes, hyperlipidemia and hypertension.  Diagnoses and all orders for this visit:  Hypertension, unspecified type-  His blood pressure is adequately well controlled. -     Basic metabolic panel; Future -     EKG 12-Lead  Postoperative hypothyroidism- His TSH  is in the normal range.  He will remain on the current dose of levothyroxine. -     TSH; Future -     levothyroxine (SYNTHROID) 175 MCG tablet; Take 1 tablet (175 mcg total) by mouth daily before breakfast.  Type II diabetes mellitus with manifestations ([place])- He has achieved excellent glycemic control with no medications. -     Cancel: Hemoglobin A1c; Future -     POCT glycosylated hemoglobin (Hb A1C) -     Ambulatory referral to Ophthalmology  Hyperlipidemia LDL goal <100- His ASCVD risk score is elevated at 51%.  I have asked him to start taking a statin for CV risk reduction. -     Lipid panel; Future -     Pitavastatin Calcium (LIVALO) 4 MG TABS; Take 1 tablet (4 mg total) by mouth daily.  PSA elevation -     Cancel: PSA, total and free; Future  Need for influenza vaccination -     Flu Vaccine QUAD High Dose(Fluad)  Tachycardia- I will screen for substance abuse. -     Drug Abuse 10-50+Ethanol, U; Future  DOE (dyspnea on exertion) -     MYOCARDIAL PERFUSION IMAGING; Future  Abnormal electrocardiogram (ECG) (EKG)- He has a high ASCVD risk score.  I have asked him to undergo a myocardial perfusion imaging to screen for cardiac ischemia. -     MYOCARDIAL PERFUSION IMAGING; Future  Colon cancer screening -     Ambulatory referral to Gastroenterology  Abnormal electrocardiogram -     MYOCARDIAL PERFUSION IMAGING; Future  Deep vein thrombosis (DVT) of calf muscle vein of left lower extremity, unspecified chronicity- His d-dimer is normal now.  Will continue the DOAC. -     D-dimer, quantitative (not at [facility]); Future   I have discontinued Honor Loh. Leggitt's rosuvastatin, Empagliflozin-metFORMIN HCl ER, and Rivaroxaban. I have also changed his levothyroxine. Additionally, I am having him start on Livalo. Lastly, I am having him maintain his omeprazole, umeclidinium-vilanterol, mirtazapine, cyclobenzaprine, Xarelto, and oxyCODONE-acetaminophen.  Meds ordered this encounter   Medications  . Pitavastatin Calcium (LIVALO) 4 MG TABS    Sig: Take 1 tablet (4 mg total) by mouth daily.    Dispense:  42 tablet    Refill:  0  . levothyroxine (SYNTHROID) 175 MCG tablet    Sig: Take 1 tablet (175 mcg total) by mouth daily before breakfast.    Dispense:  90 tablet    Refill:  1     Follow-up: Return in about 4 weeks (around 01/11/2019).  Scarlette Calico, MD

## 2018-12-15 ENCOUNTER — Encounter: Payer: Self-pay | Admitting: Internal Medicine

## 2018-12-15 MED ORDER — LEVOTHYROXINE SODIUM 175 MCG PO TABS: 175.0000 ug | ORAL_TABLET | Freq: Every day | ORAL | 1 refills | Status: DC

## 2018-12-18 LAB — DRUG ABUSE 10-50+ETHANOL, U

## 2018-12-18 LAB — D-DIMER, QUANTITATIVE: D-Dimer, Quant: 0.46 mcg/mL FEU (ref <0.50)

## 2018-12-20 ENCOUNTER — Encounter: Payer: Self-pay | Admitting: Internal Medicine

## 2018-12-20 ENCOUNTER — Telehealth (HOSPITAL_COMMUNITY): Payer: Self-pay

## 2018-12-20 ENCOUNTER — Other Ambulatory Visit: Payer: Self-pay | Admitting: Internal Medicine

## 2018-12-20 DIAGNOSIS — F3341 Major depressive disorder, recurrent, in partial remission: Secondary | ICD-10-CM

## 2018-12-20 DIAGNOSIS — I824Z2 Acute embolism and thrombosis of unspecified deep veins of left distal lower extremity: Secondary | ICD-10-CM | POA: Diagnosis not present

## 2018-12-20 NOTE — Telephone Encounter (Signed)
Encounter complete. 

## 2018-12-21 ENCOUNTER — Telehealth (HOSPITAL_COMMUNITY): Payer: Self-pay

## 2018-12-21 NOTE — Telephone Encounter (Signed)
Encounter complete. 

## 2018-12-22 ENCOUNTER — Other Ambulatory Visit: Payer: Self-pay

## 2018-12-22 ENCOUNTER — Ambulatory Visit (HOSPITAL_COMMUNITY)
Admission: RE | Admit: 2018-12-22 | Discharge: 2018-12-22 | Disposition: A | Discharge status: Home / Self Care | Payer: Medicare Other | Source: Ambulatory Visit | Attending: Cardiology | Admitting: Cardiology

## 2018-12-22 ENCOUNTER — Other Ambulatory Visit: Payer: Self-pay | Admitting: Internal Medicine

## 2018-12-22 ENCOUNTER — Encounter: Payer: Self-pay | Admitting: Internal Medicine

## 2018-12-22 DIAGNOSIS — R0609 Other forms of dyspnea: Secondary | ICD-10-CM | POA: Diagnosis not present

## 2018-12-22 DIAGNOSIS — R9431 Abnormal electrocardiogram [ECG] [EKG]: Secondary | ICD-10-CM | POA: Insufficient documentation

## 2018-12-22 DIAGNOSIS — R9439 Abnormal result of other cardiovascular function study: Secondary | ICD-10-CM | POA: Insufficient documentation

## 2018-12-22 LAB — MYOCARDIAL PERFUSION IMAGING
LV dias vol: 84 mL (ref 62–150)
LV sys vol: 33 mL
Peak HR: 109 {beats}/min
Rest HR: 84 {beats}/min
SDS: 2
SRS: 3
SSS: 5

## 2018-12-22 MED ORDER — TECHNETIUM TC 99M TETROFOSMIN IV KIT
9.8000 | PACK | Freq: Once | INTRAVENOUS | Status: AC | PRN
  Administered 2018-12-22: 9.8 via INTRAVENOUS
  Filled 2018-12-22: qty 10

## 2018-12-22 MED ORDER — REGADENOSON 0.4 MG/5ML IV SOLN
0.4000 mg | Freq: Once | INTRAVENOUS | Status: AC
  Administered 2018-12-22: 0.4 mg via INTRAVENOUS

## 2018-12-22 MED ORDER — TECHNETIUM TC 99M TETROFOSMIN IV KIT
30.6000 | PACK | Freq: Once | INTRAVENOUS | Status: AC | PRN
  Administered 2018-12-22: 30.6 via INTRAVENOUS
  Filled 2018-12-22: qty 31

## 2018-12-26 ENCOUNTER — Other Ambulatory Visit: Payer: Self-pay | Admitting: Internal Medicine

## 2018-12-26 DIAGNOSIS — F3341 Major depressive disorder, recurrent, in partial remission: Secondary | ICD-10-CM

## 2018-12-26 DIAGNOSIS — K219 Gastro-esophageal reflux disease without esophagitis: Secondary | ICD-10-CM

## 2018-12-26 MED ORDER — OMEPRAZOLE 40 MG PO CPDR: 40.0000 mg | DELAYED_RELEASE_CAPSULE | Freq: Two times a day (BID) | ORAL | 1 refills | Status: DC

## 2018-12-26 MED ORDER — MIRTAZAPINE 30 MG PO TBDP: ORAL_TABLET | ORAL | 1 refills | Status: DC

## 2019-01-19 DIAGNOSIS — I10 Essential (primary) hypertension: Secondary | ICD-10-CM | POA: Diagnosis not present

## 2019-01-19 DIAGNOSIS — R6889 Other general symptoms and signs: Secondary | ICD-10-CM | POA: Diagnosis not present

## 2019-01-19 DIAGNOSIS — Z01818 Encounter for other preprocedural examination: Secondary | ICD-10-CM | POA: Insufficient documentation

## 2019-01-19 DIAGNOSIS — R9439 Abnormal result of other cardiovascular function study: Secondary | ICD-10-CM | POA: Diagnosis not present

## 2019-01-19 DIAGNOSIS — E7849 Other hyperlipidemia: Secondary | ICD-10-CM | POA: Diagnosis not present

## 2019-01-19 DIAGNOSIS — I4510 Unspecified right bundle-branch block: Secondary | ICD-10-CM | POA: Diagnosis not present

## 2019-01-22 NOTE — Progress Notes (Deleted)
Cardiology Office Note   Date:  01/22/2019   ID:  Lee Mitchell, Lee Mitchell [birth year], MRN [id number]  PCP:  [name], MD  Cardiologist:   No primary care provider on file. Referring:  ***  No chief complaint on file.     History of Present Illness: Lee Mitchell is a 68 y.o. male who is referred by *** for evaluation of an abnormal stress test.  ***     The left ventricular ejection fraction is normal (55-65%).  Nuclear stress EF: 61%. No wall motion abnormalities.  Defect 1: There is a medium defect of moderate severity present in the basal inferior, mid inferior and apical inferior location.  Findings consistent with ischemia in the inferior territory.  This is an intermediate risk study.  Past Medical History:  Diagnosis Date  . Chronic abdominal pain   . Colon polyps   . PNA (pneumonia)   . Thyroid cancer [facility])     Past Surgical History:  Procedure Laterality Date  . ANKLE ARTHROSCOPY WITH RECONSTRUCTION    . CHOLECYSTECTOMY    . COLONOSCOPY    . ELBOW ARTHROSCOPY WITH TENDON RECONSTRUCTION    . SHOULDER ARTHROSCOPY WITH LABRAL REPAIR    . THYROIDECTOMY     2004/2005  . ULNAR NERVE TRANSPOSITION       Current Outpatient Medications  Medication Sig Dispense Refill  . cyclobenzaprine (FLEXERIL) 10 MG tablet     . levothyroxine (SYNTHROID) 175 MCG tablet Take 1 tablet (175 mcg total) by mouth daily before breakfast. 90 tablet 1  . mirtazapine (REMERON SOL-TAB) 30 MG disintegrating tablet DISSOLVE 1 TABLET UNDER TONGUE AT BEDTIME 90 tablet 1  . omeprazole (PRILOSEC) 40 MG capsule Take 1 capsule (40 mg total) by mouth 2 (two) times daily. 90 capsule 1  . oxyCODONE-acetaminophen (PERCOCET) 7.5-325 MG tablet     . Pitavastatin Calcium (LIVALO) 4 MG TABS Take 1 tablet (4 mg total) by mouth daily. 42 tablet 0  . umeclidinium-vilanterol (ANORO ELLIPTA) 62.5-25 MCG/INH AEPB Inhale 1 puff into the lungs daily. 1 each 0  . XARELTO 20 MG TABS tablet       No current facility-administered medications for this visit.     Allergies:   Morphine and related    Social History:  The patient  reports that he has been smoking cigarettes. He has a 8.75 pack-year smoking history. He has never used smokeless tobacco. He reports previous alcohol use. He reports current drug use. Drug: Marijuana.   Family History:  The patient's ***family history includes Cancer (age of onset: 33) in his mother; Hypertension in his father and mother; Hyperthyroidism in his mother; Kidney cancer in his father; Thyroid cancer in his maternal uncle.    ROS:  Please see the history of present illness.   Otherwise, review of systems are positive for {NONE DEFAULTED:18576::"none"}.   All other systems are reviewed and negative.    PHYSICAL EXAM: VS:  There were no vitals taken for this visit. , BMI There is no height or weight on file to calculate BMI. GENERAL:  Well appearing HEENT:  Pupils equal round and reactive, fundi not visualized, oral mucosa unremarkable NECK:  No jugular venous distention, waveform within normal limits, carotid upstroke brisk and symmetric, no bruits, no thyromegaly LYMPHATICS:  No cervical, inguinal adenopathy LUNGS:  Clear to auscultation bilaterally BACK:  No CVA tenderness CHEST:  Unremarkable HEART:  PMI not displaced or sustained,S1 and S2 within normal limits, no S3, no S4,  no clicks, no rubs, *** murmurs ABD:  Flat, positive bowel sounds normal in frequency in pitch, no bruits, no rebound, no guarding, no midline pulsatile mass, no hepatomegaly, no splenomegaly EXT:  2 plus pulses throughout, no edema, no cyanosis no clubbing SKIN:  No rashes no nodules NEURO:  Cranial nerves II through XII grossly intact, motor grossly intact throughout PSYCH:  Cognitively intact, oriented to person place and time    EKG:  EKG {ACTION; IS/IS [id number] ordered today. The ekg ordered today demonstrates ***   Recent Labs: 04/26/2018: ALT 13;  Hemoglobin 14.2; Platelets 355.0 12/14/2018: BUN 12; Creatinine, Ser 1.00; Potassium 3.7; Sodium 136; TSH 0.41    Lipid Panel    Component Value Date/Time   CHOL 299 (H) 12/14/2018 0949   TRIG 194.0 (H) 12/14/2018 0949   HDL 34.80 (L) 12/14/2018 0949   CHOLHDL 9 12/14/2018 0949   VLDL 38.8 12/14/2018 0949   LDLCALC 225 (H) 12/14/2018 0949      Wt Readings from Last 3 Encounters:  12/22/18 176 lb (79.8 kg)  12/14/18 176 lb 12 oz (80.2 kg)  09/20/18 176 lb (79.8 kg)      Other studies Reviewed: Additional studies/ records that were reviewed today include: ***. Review of the above records demonstrates:  Please see elsewhere in the note.  ***   ASSESSMENT AND PLAN:  ABNORMAL STRESS TEST:  ***   Current medicines are reviewed at length with the patient today.  The patient {ACTIONS; HAS/DOES NOT HAVE:19233} concerns regarding medicines.  The following changes have been made:  {PLAN; NO CHANGE:13088:s}  Labs/ tests ordered today include: *** No orders of the defined types were placed in this encounter.    Disposition:   FU with ***    Signed, Minus Breeding, MD  01/22/2019 9:22 PM    [facility]

## 2019-01-24 ENCOUNTER — Ambulatory Visit: Payer: Medicare Other | Admitting: Cardiology

## 2019-02-05 ENCOUNTER — Other Ambulatory Visit: Payer: Self-pay | Admitting: Internal Medicine

## 2019-02-05 DIAGNOSIS — K219 Gastro-esophageal reflux disease without esophagitis: Secondary | ICD-10-CM

## 2019-02-06 DIAGNOSIS — M4726 Other spondylosis with radiculopathy, lumbar region: Secondary | ICD-10-CM | POA: Diagnosis not present

## 2019-02-16 DIAGNOSIS — R682 Dry mouth, unspecified: Secondary | ICD-10-CM | POA: Diagnosis not present

## 2019-02-16 DIAGNOSIS — E782 Mixed hyperlipidemia: Secondary | ICD-10-CM | POA: Diagnosis not present

## 2019-02-16 DIAGNOSIS — E039 Hypothyroidism, unspecified: Secondary | ICD-10-CM | POA: Diagnosis not present

## 2019-02-16 DIAGNOSIS — R6889 Other general symptoms and signs: Secondary | ICD-10-CM | POA: Diagnosis not present

## 2019-02-16 DIAGNOSIS — Z1211 Encounter for screening for malignant neoplasm of colon: Secondary | ICD-10-CM | POA: Diagnosis not present

## 2019-02-21 DIAGNOSIS — R7309 Other abnormal glucose: Secondary | ICD-10-CM | POA: Diagnosis not present

## 2019-02-21 DIAGNOSIS — E782 Mixed hyperlipidemia: Secondary | ICD-10-CM | POA: Diagnosis not present

## 2019-02-22 ENCOUNTER — Encounter: Payer: Self-pay | Admitting: Internal Medicine

## 2019-02-26 DIAGNOSIS — E119 Type 2 diabetes mellitus without complications: Secondary | ICD-10-CM | POA: Diagnosis not present

## 2019-02-26 DIAGNOSIS — M5136 Other intervertebral disc degeneration, lumbar region: Secondary | ICD-10-CM | POA: Diagnosis not present

## 2019-02-26 DIAGNOSIS — R0982 Postnasal drip: Secondary | ICD-10-CM | POA: Diagnosis not present

## 2019-02-26 DIAGNOSIS — Z23 Encounter for immunization: Secondary | ICD-10-CM | POA: Diagnosis not present

## 2019-02-26 DIAGNOSIS — F5101 Primary insomnia: Secondary | ICD-10-CM | POA: Diagnosis not present

## 2019-02-28 DIAGNOSIS — Z1211 Encounter for screening for malignant neoplasm of colon: Secondary | ICD-10-CM | POA: Diagnosis not present

## 2019-02-28 DIAGNOSIS — K5730 Diverticulosis of large intestine without perforation or abscess without bleeding: Secondary | ICD-10-CM | POA: Diagnosis not present

## 2019-02-28 DIAGNOSIS — Z86010 Personal history of colonic polyps: Secondary | ICD-10-CM | POA: Diagnosis not present

## 2019-02-28 DIAGNOSIS — K648 Other hemorrhoids: Secondary | ICD-10-CM | POA: Diagnosis not present

## 2019-02-28 DIAGNOSIS — R6889 Other general symptoms and signs: Secondary | ICD-10-CM | POA: Diagnosis not present

## 2019-03-02 DIAGNOSIS — M5431 Sciatica, right side: Secondary | ICD-10-CM | POA: Diagnosis not present

## 2019-03-02 DIAGNOSIS — M4726 Other spondylosis with radiculopathy, lumbar region: Secondary | ICD-10-CM | POA: Diagnosis not present

## 2019-03-02 DIAGNOSIS — M5116 Intervertebral disc disorders with radiculopathy, lumbar region: Secondary | ICD-10-CM | POA: Diagnosis not present

## 2019-03-02 DIAGNOSIS — R6889 Other general symptoms and signs: Secondary | ICD-10-CM | POA: Diagnosis not present

## 2019-03-06 DIAGNOSIS — M47816 Spondylosis without myelopathy or radiculopathy, lumbar region: Secondary | ICD-10-CM | POA: Diagnosis not present

## 2019-03-06 DIAGNOSIS — M5136 Other intervertebral disc degeneration, lumbar region: Secondary | ICD-10-CM | POA: Diagnosis not present

## 2019-03-06 DIAGNOSIS — M5440 Lumbago with sciatica, unspecified side: Secondary | ICD-10-CM | POA: Diagnosis not present

## 2019-03-06 DIAGNOSIS — Z01812 Encounter for preprocedural laboratory examination: Secondary | ICD-10-CM | POA: Diagnosis not present

## 2019-03-13 DIAGNOSIS — J439 Emphysema, unspecified: Secondary | ICD-10-CM | POA: Diagnosis not present

## 2019-03-13 DIAGNOSIS — Z981 Arthrodesis status: Secondary | ICD-10-CM | POA: Diagnosis not present

## 2019-03-13 DIAGNOSIS — M545 Low back pain: Secondary | ICD-10-CM | POA: Diagnosis not present

## 2019-03-13 DIAGNOSIS — Z86718 Personal history of other venous thrombosis and embolism: Secondary | ICD-10-CM | POA: Diagnosis not present

## 2019-03-13 DIAGNOSIS — Z7984 Long term (current) use of oral hypoglycemic drugs: Secondary | ICD-10-CM | POA: Diagnosis not present

## 2019-03-13 DIAGNOSIS — M5432 Sciatica, left side: Secondary | ICD-10-CM | POA: Diagnosis not present

## 2019-03-13 DIAGNOSIS — Z85850 Personal history of malignant neoplasm of thyroid: Secondary | ICD-10-CM | POA: Diagnosis not present

## 2019-03-13 DIAGNOSIS — M4726 Other spondylosis with radiculopathy, lumbar region: Secondary | ICD-10-CM | POA: Insufficient documentation

## 2019-03-13 DIAGNOSIS — E119 Type 2 diabetes mellitus without complications: Secondary | ICD-10-CM | POA: Diagnosis not present

## 2019-03-13 DIAGNOSIS — M5116 Intervertebral disc disorders with radiculopathy, lumbar region: Secondary | ICD-10-CM | POA: Diagnosis not present

## 2019-03-13 DIAGNOSIS — Z79899 Other long term (current) drug therapy: Secondary | ICD-10-CM | POA: Diagnosis not present

## 2019-03-13 DIAGNOSIS — M5431 Sciatica, right side: Secondary | ICD-10-CM | POA: Diagnosis not present

## 2019-03-13 DIAGNOSIS — M5117 Intervertebral disc disorders with radiculopathy, lumbosacral region: Secondary | ICD-10-CM | POA: Diagnosis not present

## 2019-03-13 DIAGNOSIS — M48062 Spinal stenosis, lumbar region with neurogenic claudication: Secondary | ICD-10-CM | POA: Diagnosis not present

## 2019-03-13 DIAGNOSIS — M4326 Fusion of spine, lumbar region: Secondary | ICD-10-CM | POA: Diagnosis not present

## 2019-03-14 DIAGNOSIS — Z7984 Long term (current) use of oral hypoglycemic drugs: Secondary | ICD-10-CM | POA: Diagnosis not present

## 2019-03-14 DIAGNOSIS — M5117 Intervertebral disc disorders with radiculopathy, lumbosacral region: Secondary | ICD-10-CM | POA: Diagnosis not present

## 2019-03-14 DIAGNOSIS — M5116 Intervertebral disc disorders with radiculopathy, lumbar region: Secondary | ICD-10-CM | POA: Diagnosis not present

## 2019-03-14 DIAGNOSIS — R2681 Unsteadiness on feet: Secondary | ICD-10-CM | POA: Diagnosis not present

## 2019-03-14 DIAGNOSIS — Z85850 Personal history of malignant neoplasm of thyroid: Secondary | ICD-10-CM | POA: Diagnosis not present

## 2019-03-14 DIAGNOSIS — M48062 Spinal stenosis, lumbar region with neurogenic claudication: Secondary | ICD-10-CM | POA: Diagnosis not present

## 2019-03-14 DIAGNOSIS — M4326 Fusion of spine, lumbar region: Secondary | ICD-10-CM | POA: Diagnosis not present

## 2019-03-14 DIAGNOSIS — M4726 Other spondylosis with radiculopathy, lumbar region: Secondary | ICD-10-CM | POA: Diagnosis not present

## 2019-03-14 DIAGNOSIS — J439 Emphysema, unspecified: Secondary | ICD-10-CM | POA: Diagnosis not present

## 2019-03-14 DIAGNOSIS — Z79899 Other long term (current) drug therapy: Secondary | ICD-10-CM | POA: Diagnosis not present

## 2019-03-14 DIAGNOSIS — Z86718 Personal history of other venous thrombosis and embolism: Secondary | ICD-10-CM | POA: Diagnosis not present

## 2019-03-14 DIAGNOSIS — E119 Type 2 diabetes mellitus without complications: Secondary | ICD-10-CM | POA: Diagnosis not present

## 2019-03-18 DIAGNOSIS — R2243 Localized swelling, mass and lump, lower limb, bilateral: Secondary | ICD-10-CM | POA: Diagnosis not present

## 2019-03-18 DIAGNOSIS — R109 Unspecified abdominal pain: Secondary | ICD-10-CM | POA: Diagnosis not present

## 2019-03-18 DIAGNOSIS — D1803 Hemangioma of intra-abdominal structures: Secondary | ICD-10-CM | POA: Diagnosis not present

## 2019-03-18 DIAGNOSIS — E871 Hypo-osmolality and hyponatremia: Secondary | ICD-10-CM | POA: Diagnosis not present

## 2019-03-18 DIAGNOSIS — T8189XA Other complications of procedures, not elsewhere classified, initial encounter: Secondary | ICD-10-CM | POA: Diagnosis not present

## 2019-03-18 DIAGNOSIS — N289 Disorder of kidney and ureter, unspecified: Secondary | ICD-10-CM | POA: Diagnosis not present

## 2019-03-18 DIAGNOSIS — D649 Anemia, unspecified: Secondary | ICD-10-CM | POA: Diagnosis not present

## 2019-03-18 DIAGNOSIS — D1809 Hemangioma of other sites: Secondary | ICD-10-CM | POA: Diagnosis not present

## 2019-03-18 DIAGNOSIS — R609 Edema, unspecified: Secondary | ICD-10-CM | POA: Diagnosis not present

## 2019-03-18 DIAGNOSIS — I700 Atherosclerosis of aorta: Secondary | ICD-10-CM | POA: Diagnosis not present

## 2019-03-18 DIAGNOSIS — R0602 Shortness of breath: Secondary | ICD-10-CM | POA: Diagnosis not present

## 2019-03-20 DIAGNOSIS — M4726 Other spondylosis with radiculopathy, lumbar region: Secondary | ICD-10-CM | POA: Diagnosis not present

## 2019-03-20 DIAGNOSIS — G894 Chronic pain syndrome: Secondary | ICD-10-CM | POA: Diagnosis not present

## 2019-03-20 DIAGNOSIS — Z7901 Long term (current) use of anticoagulants: Secondary | ICD-10-CM | POA: Diagnosis not present

## 2019-03-20 DIAGNOSIS — E119 Type 2 diabetes mellitus without complications: Secondary | ICD-10-CM | POA: Diagnosis not present

## 2019-03-20 DIAGNOSIS — Z981 Arthrodesis status: Secondary | ICD-10-CM | POA: Diagnosis not present

## 2019-03-20 DIAGNOSIS — E890 Postprocedural hypothyroidism: Secondary | ICD-10-CM | POA: Diagnosis not present

## 2019-03-20 DIAGNOSIS — I10 Essential (primary) hypertension: Secondary | ICD-10-CM | POA: Diagnosis not present

## 2019-03-20 DIAGNOSIS — Z86718 Personal history of other venous thrombosis and embolism: Secondary | ICD-10-CM | POA: Diagnosis not present

## 2019-03-20 DIAGNOSIS — Z4789 Encounter for other orthopedic aftercare: Secondary | ICD-10-CM | POA: Diagnosis not present

## 2019-03-20 DIAGNOSIS — Z7984 Long term (current) use of oral hypoglycemic drugs: Secondary | ICD-10-CM | POA: Diagnosis not present

## 2019-03-20 DIAGNOSIS — Z85850 Personal history of malignant neoplasm of thyroid: Secondary | ICD-10-CM | POA: Diagnosis not present

## 2019-03-20 DIAGNOSIS — M150 Primary generalized (osteo)arthritis: Secondary | ICD-10-CM | POA: Diagnosis not present

## 2019-03-20 DIAGNOSIS — M5116 Intervertebral disc disorders with radiculopathy, lumbar region: Secondary | ICD-10-CM | POA: Diagnosis not present

## 2019-03-20 DIAGNOSIS — M48062 Spinal stenosis, lumbar region with neurogenic claudication: Secondary | ICD-10-CM | POA: Diagnosis not present

## 2019-03-20 DIAGNOSIS — Z79891 Long term (current) use of opiate analgesic: Secondary | ICD-10-CM | POA: Diagnosis not present

## 2019-03-20 DIAGNOSIS — J432 Centrilobular emphysema: Secondary | ICD-10-CM | POA: Diagnosis not present

## 2019-03-22 DIAGNOSIS — M48062 Spinal stenosis, lumbar region with neurogenic claudication: Secondary | ICD-10-CM | POA: Diagnosis not present

## 2019-03-22 DIAGNOSIS — Z7984 Long term (current) use of oral hypoglycemic drugs: Secondary | ICD-10-CM | POA: Diagnosis not present

## 2019-03-22 DIAGNOSIS — Z4789 Encounter for other orthopedic aftercare: Secondary | ICD-10-CM | POA: Diagnosis not present

## 2019-03-22 DIAGNOSIS — M4726 Other spondylosis with radiculopathy, lumbar region: Secondary | ICD-10-CM | POA: Diagnosis not present

## 2019-03-22 DIAGNOSIS — Z981 Arthrodesis status: Secondary | ICD-10-CM | POA: Diagnosis not present

## 2019-03-22 DIAGNOSIS — M150 Primary generalized (osteo)arthritis: Secondary | ICD-10-CM | POA: Diagnosis not present

## 2019-03-22 DIAGNOSIS — Z7901 Long term (current) use of anticoagulants: Secondary | ICD-10-CM | POA: Diagnosis not present

## 2019-03-22 DIAGNOSIS — Z86718 Personal history of other venous thrombosis and embolism: Secondary | ICD-10-CM | POA: Diagnosis not present

## 2019-03-22 DIAGNOSIS — G894 Chronic pain syndrome: Secondary | ICD-10-CM | POA: Diagnosis not present

## 2019-03-22 DIAGNOSIS — E890 Postprocedural hypothyroidism: Secondary | ICD-10-CM | POA: Diagnosis not present

## 2019-03-22 DIAGNOSIS — I10 Essential (primary) hypertension: Secondary | ICD-10-CM | POA: Diagnosis not present

## 2019-03-22 DIAGNOSIS — E119 Type 2 diabetes mellitus without complications: Secondary | ICD-10-CM | POA: Diagnosis not present

## 2019-03-22 DIAGNOSIS — J432 Centrilobular emphysema: Secondary | ICD-10-CM | POA: Diagnosis not present

## 2019-03-22 DIAGNOSIS — M5116 Intervertebral disc disorders with radiculopathy, lumbar region: Secondary | ICD-10-CM | POA: Diagnosis not present

## 2019-03-22 DIAGNOSIS — Z85850 Personal history of malignant neoplasm of thyroid: Secondary | ICD-10-CM | POA: Diagnosis not present

## 2019-03-22 DIAGNOSIS — Z79891 Long term (current) use of opiate analgesic: Secondary | ICD-10-CM | POA: Diagnosis not present

## 2019-03-26 DIAGNOSIS — Z743 Need for continuous supervision: Secondary | ICD-10-CM | POA: Diagnosis not present

## 2019-03-26 DIAGNOSIS — E890 Postprocedural hypothyroidism: Secondary | ICD-10-CM | POA: Diagnosis not present

## 2019-03-26 DIAGNOSIS — Z4789 Encounter for other orthopedic aftercare: Secondary | ICD-10-CM | POA: Diagnosis not present

## 2019-03-26 DIAGNOSIS — Z7901 Long term (current) use of anticoagulants: Secondary | ICD-10-CM | POA: Diagnosis not present

## 2019-03-26 DIAGNOSIS — M48062 Spinal stenosis, lumbar region with neurogenic claudication: Secondary | ICD-10-CM | POA: Diagnosis not present

## 2019-03-26 DIAGNOSIS — Z7984 Long term (current) use of oral hypoglycemic drugs: Secondary | ICD-10-CM | POA: Diagnosis not present

## 2019-03-26 DIAGNOSIS — Z79891 Long term (current) use of opiate analgesic: Secondary | ICD-10-CM | POA: Diagnosis not present

## 2019-03-26 DIAGNOSIS — E119 Type 2 diabetes mellitus without complications: Secondary | ICD-10-CM | POA: Diagnosis not present

## 2019-03-26 DIAGNOSIS — Z85850 Personal history of malignant neoplasm of thyroid: Secondary | ICD-10-CM | POA: Diagnosis not present

## 2019-03-26 DIAGNOSIS — R0902 Hypoxemia: Secondary | ICD-10-CM | POA: Diagnosis not present

## 2019-03-26 DIAGNOSIS — M150 Primary generalized (osteo)arthritis: Secondary | ICD-10-CM | POA: Diagnosis not present

## 2019-03-26 DIAGNOSIS — M5116 Intervertebral disc disorders with radiculopathy, lumbar region: Secondary | ICD-10-CM | POA: Diagnosis not present

## 2019-03-26 DIAGNOSIS — R52 Pain, unspecified: Secondary | ICD-10-CM | POA: Diagnosis not present

## 2019-03-26 DIAGNOSIS — Z981 Arthrodesis status: Secondary | ICD-10-CM | POA: Diagnosis not present

## 2019-03-26 DIAGNOSIS — I10 Essential (primary) hypertension: Secondary | ICD-10-CM | POA: Diagnosis not present

## 2019-03-26 DIAGNOSIS — G894 Chronic pain syndrome: Secondary | ICD-10-CM | POA: Diagnosis not present

## 2019-03-26 DIAGNOSIS — J432 Centrilobular emphysema: Secondary | ICD-10-CM | POA: Diagnosis not present

## 2019-03-26 DIAGNOSIS — M4726 Other spondylosis with radiculopathy, lumbar region: Secondary | ICD-10-CM | POA: Diagnosis not present

## 2019-03-26 DIAGNOSIS — Z86718 Personal history of other venous thrombosis and embolism: Secondary | ICD-10-CM | POA: Diagnosis not present

## 2019-03-27 DIAGNOSIS — M545 Low back pain: Secondary | ICD-10-CM | POA: Diagnosis not present

## 2019-03-27 DIAGNOSIS — G8918 Other acute postprocedural pain: Secondary | ICD-10-CM | POA: Diagnosis not present

## 2019-03-27 DIAGNOSIS — Z981 Arthrodesis status: Secondary | ICD-10-CM | POA: Diagnosis not present

## 2019-03-29 DIAGNOSIS — J432 Centrilobular emphysema: Secondary | ICD-10-CM | POA: Diagnosis not present

## 2019-03-29 DIAGNOSIS — Z7984 Long term (current) use of oral hypoglycemic drugs: Secondary | ICD-10-CM | POA: Diagnosis not present

## 2019-03-29 DIAGNOSIS — M5116 Intervertebral disc disorders with radiculopathy, lumbar region: Secondary | ICD-10-CM | POA: Diagnosis not present

## 2019-03-29 DIAGNOSIS — E890 Postprocedural hypothyroidism: Secondary | ICD-10-CM | POA: Diagnosis not present

## 2019-03-29 DIAGNOSIS — Z7901 Long term (current) use of anticoagulants: Secondary | ICD-10-CM | POA: Diagnosis not present

## 2019-03-29 DIAGNOSIS — Z981 Arthrodesis status: Secondary | ICD-10-CM | POA: Diagnosis not present

## 2019-03-29 DIAGNOSIS — Z4789 Encounter for other orthopedic aftercare: Secondary | ICD-10-CM | POA: Diagnosis not present

## 2019-03-29 DIAGNOSIS — M48062 Spinal stenosis, lumbar region with neurogenic claudication: Secondary | ICD-10-CM | POA: Diagnosis not present

## 2019-03-29 DIAGNOSIS — G894 Chronic pain syndrome: Secondary | ICD-10-CM | POA: Diagnosis not present

## 2019-03-29 DIAGNOSIS — I10 Essential (primary) hypertension: Secondary | ICD-10-CM | POA: Diagnosis not present

## 2019-03-29 DIAGNOSIS — Z86718 Personal history of other venous thrombosis and embolism: Secondary | ICD-10-CM | POA: Diagnosis not present

## 2019-03-29 DIAGNOSIS — M150 Primary generalized (osteo)arthritis: Secondary | ICD-10-CM | POA: Diagnosis not present

## 2019-03-29 DIAGNOSIS — M4726 Other spondylosis with radiculopathy, lumbar region: Secondary | ICD-10-CM | POA: Diagnosis not present

## 2019-03-29 DIAGNOSIS — E119 Type 2 diabetes mellitus without complications: Secondary | ICD-10-CM | POA: Diagnosis not present

## 2019-03-29 DIAGNOSIS — Z79891 Long term (current) use of opiate analgesic: Secondary | ICD-10-CM | POA: Diagnosis not present

## 2019-03-29 DIAGNOSIS — Z85850 Personal history of malignant neoplasm of thyroid: Secondary | ICD-10-CM | POA: Diagnosis not present

## 2019-03-30 DIAGNOSIS — I10 Essential (primary) hypertension: Secondary | ICD-10-CM | POA: Diagnosis not present

## 2019-03-30 DIAGNOSIS — Z86718 Personal history of other venous thrombosis and embolism: Secondary | ICD-10-CM | POA: Diagnosis not present

## 2019-03-30 DIAGNOSIS — Z79891 Long term (current) use of opiate analgesic: Secondary | ICD-10-CM | POA: Diagnosis not present

## 2019-03-30 DIAGNOSIS — Z4789 Encounter for other orthopedic aftercare: Secondary | ICD-10-CM | POA: Diagnosis not present

## 2019-03-30 DIAGNOSIS — Z85850 Personal history of malignant neoplasm of thyroid: Secondary | ICD-10-CM | POA: Diagnosis not present

## 2019-03-30 DIAGNOSIS — E119 Type 2 diabetes mellitus without complications: Secondary | ICD-10-CM | POA: Diagnosis not present

## 2019-03-30 DIAGNOSIS — M48062 Spinal stenosis, lumbar region with neurogenic claudication: Secondary | ICD-10-CM | POA: Diagnosis not present

## 2019-03-30 DIAGNOSIS — J432 Centrilobular emphysema: Secondary | ICD-10-CM | POA: Diagnosis not present

## 2019-03-30 DIAGNOSIS — G894 Chronic pain syndrome: Secondary | ICD-10-CM | POA: Diagnosis not present

## 2019-03-30 DIAGNOSIS — E890 Postprocedural hypothyroidism: Secondary | ICD-10-CM | POA: Diagnosis not present

## 2019-03-30 DIAGNOSIS — Z7901 Long term (current) use of anticoagulants: Secondary | ICD-10-CM | POA: Diagnosis not present

## 2019-03-30 DIAGNOSIS — Z981 Arthrodesis status: Secondary | ICD-10-CM | POA: Diagnosis not present

## 2019-03-30 DIAGNOSIS — M5116 Intervertebral disc disorders with radiculopathy, lumbar region: Secondary | ICD-10-CM | POA: Diagnosis not present

## 2019-03-30 DIAGNOSIS — Z7984 Long term (current) use of oral hypoglycemic drugs: Secondary | ICD-10-CM | POA: Diagnosis not present

## 2019-03-30 DIAGNOSIS — M4726 Other spondylosis with radiculopathy, lumbar region: Secondary | ICD-10-CM | POA: Diagnosis not present

## 2019-03-30 DIAGNOSIS — M150 Primary generalized (osteo)arthritis: Secondary | ICD-10-CM | POA: Diagnosis not present

## 2019-04-02 ENCOUNTER — Other Ambulatory Visit: Payer: Self-pay | Admitting: *Deleted

## 2019-04-02 NOTE — Patient Outreach (Signed)
[name]) Care Management  04/02/2019  Lee Mitchell [birth year] [id number]    Referral received 03/22/2019 Initial Outreach 04/02/2019  RN attempted outreach call however unsuccessful. RN able to leave a HIPAA approved voice message requesting a call back.   Plan: Will attempt another outreach call over the next week.  Raina Mina, RN Care Management Coordinator [place] Office [phone]

## 2019-04-04 DIAGNOSIS — Z7901 Long term (current) use of anticoagulants: Secondary | ICD-10-CM | POA: Diagnosis not present

## 2019-04-04 DIAGNOSIS — M150 Primary generalized (osteo)arthritis: Secondary | ICD-10-CM | POA: Diagnosis not present

## 2019-04-04 DIAGNOSIS — I10 Essential (primary) hypertension: Secondary | ICD-10-CM | POA: Diagnosis not present

## 2019-04-04 DIAGNOSIS — Z7984 Long term (current) use of oral hypoglycemic drugs: Secondary | ICD-10-CM | POA: Diagnosis not present

## 2019-04-04 DIAGNOSIS — Z86718 Personal history of other venous thrombosis and embolism: Secondary | ICD-10-CM | POA: Diagnosis not present

## 2019-04-04 DIAGNOSIS — M5116 Intervertebral disc disorders with radiculopathy, lumbar region: Secondary | ICD-10-CM | POA: Diagnosis not present

## 2019-04-04 DIAGNOSIS — J432 Centrilobular emphysema: Secondary | ICD-10-CM | POA: Diagnosis not present

## 2019-04-04 DIAGNOSIS — E119 Type 2 diabetes mellitus without complications: Secondary | ICD-10-CM | POA: Diagnosis not present

## 2019-04-04 DIAGNOSIS — Z4789 Encounter for other orthopedic aftercare: Secondary | ICD-10-CM | POA: Diagnosis not present

## 2019-04-04 DIAGNOSIS — E890 Postprocedural hypothyroidism: Secondary | ICD-10-CM | POA: Diagnosis not present

## 2019-04-04 DIAGNOSIS — Z85850 Personal history of malignant neoplasm of thyroid: Secondary | ICD-10-CM | POA: Diagnosis not present

## 2019-04-04 DIAGNOSIS — Z79891 Long term (current) use of opiate analgesic: Secondary | ICD-10-CM | POA: Diagnosis not present

## 2019-04-04 DIAGNOSIS — M4726 Other spondylosis with radiculopathy, lumbar region: Secondary | ICD-10-CM | POA: Diagnosis not present

## 2019-04-04 DIAGNOSIS — M48062 Spinal stenosis, lumbar region with neurogenic claudication: Secondary | ICD-10-CM | POA: Diagnosis not present

## 2019-04-04 DIAGNOSIS — Z981 Arthrodesis status: Secondary | ICD-10-CM | POA: Diagnosis not present

## 2019-04-04 DIAGNOSIS — G894 Chronic pain syndrome: Secondary | ICD-10-CM | POA: Diagnosis not present

## 2019-04-05 DIAGNOSIS — M48062 Spinal stenosis, lumbar region with neurogenic claudication: Secondary | ICD-10-CM | POA: Diagnosis not present

## 2019-04-05 DIAGNOSIS — M545 Low back pain: Secondary | ICD-10-CM | POA: Diagnosis not present

## 2019-04-05 DIAGNOSIS — M4326 Fusion of spine, lumbar region: Secondary | ICD-10-CM | POA: Diagnosis not present

## 2019-04-05 DIAGNOSIS — Z981 Arthrodesis status: Secondary | ICD-10-CM | POA: Diagnosis not present

## 2019-04-05 DIAGNOSIS — M4726 Other spondylosis with radiculopathy, lumbar region: Secondary | ICD-10-CM | POA: Diagnosis not present

## 2019-04-06 ENCOUNTER — Other Ambulatory Visit: Payer: Self-pay | Admitting: *Deleted

## 2019-04-06 DIAGNOSIS — Z981 Arthrodesis status: Secondary | ICD-10-CM | POA: Diagnosis not present

## 2019-04-06 DIAGNOSIS — Z85850 Personal history of malignant neoplasm of thyroid: Secondary | ICD-10-CM | POA: Diagnosis not present

## 2019-04-06 DIAGNOSIS — I10 Essential (primary) hypertension: Secondary | ICD-10-CM | POA: Diagnosis not present

## 2019-04-06 DIAGNOSIS — E890 Postprocedural hypothyroidism: Secondary | ICD-10-CM | POA: Diagnosis not present

## 2019-04-06 DIAGNOSIS — Z79891 Long term (current) use of opiate analgesic: Secondary | ICD-10-CM | POA: Diagnosis not present

## 2019-04-06 DIAGNOSIS — Z4789 Encounter for other orthopedic aftercare: Secondary | ICD-10-CM | POA: Diagnosis not present

## 2019-04-06 DIAGNOSIS — J432 Centrilobular emphysema: Secondary | ICD-10-CM | POA: Diagnosis not present

## 2019-04-06 DIAGNOSIS — M150 Primary generalized (osteo)arthritis: Secondary | ICD-10-CM | POA: Diagnosis not present

## 2019-04-06 DIAGNOSIS — E119 Type 2 diabetes mellitus without complications: Secondary | ICD-10-CM | POA: Diagnosis not present

## 2019-04-06 DIAGNOSIS — Z7901 Long term (current) use of anticoagulants: Secondary | ICD-10-CM | POA: Diagnosis not present

## 2019-04-06 DIAGNOSIS — G894 Chronic pain syndrome: Secondary | ICD-10-CM | POA: Diagnosis not present

## 2019-04-06 DIAGNOSIS — Z86718 Personal history of other venous thrombosis and embolism: Secondary | ICD-10-CM | POA: Diagnosis not present

## 2019-04-06 DIAGNOSIS — M4726 Other spondylosis with radiculopathy, lumbar region: Secondary | ICD-10-CM | POA: Diagnosis not present

## 2019-04-06 DIAGNOSIS — M5116 Intervertebral disc disorders with radiculopathy, lumbar region: Secondary | ICD-10-CM | POA: Diagnosis not present

## 2019-04-06 DIAGNOSIS — Z7984 Long term (current) use of oral hypoglycemic drugs: Secondary | ICD-10-CM | POA: Diagnosis not present

## 2019-04-06 DIAGNOSIS — M48062 Spinal stenosis, lumbar region with neurogenic claudication: Secondary | ICD-10-CM | POA: Diagnosis not present

## 2019-04-06 NOTE — Patient Outreach (Signed)
[facility]) Care Management  04/06/2019  DAZHON NEWITT 1950-04-29 [id number]   Telephone Assessment-Unsuccessful   RN attempt outreach call today however unsuccessful. RN able to leave a HIPAA approved voice message requesting a call back.  PLAN: RN will continue outreach attempts accordingly and address any needs presented at that time. Will scheduled next attempt over the next week.  Raina Mina, RN Care Management Coordinator [place] Office [phone]

## 2019-04-07 ENCOUNTER — Other Ambulatory Visit: Payer: Self-pay | Admitting: Internal Medicine

## 2019-04-07 DIAGNOSIS — E890 Postprocedural hypothyroidism: Secondary | ICD-10-CM

## 2019-04-11 DIAGNOSIS — Z7984 Long term (current) use of oral hypoglycemic drugs: Secondary | ICD-10-CM | POA: Diagnosis not present

## 2019-04-11 DIAGNOSIS — Z981 Arthrodesis status: Secondary | ICD-10-CM | POA: Diagnosis not present

## 2019-04-11 DIAGNOSIS — Z7901 Long term (current) use of anticoagulants: Secondary | ICD-10-CM | POA: Diagnosis not present

## 2019-04-11 DIAGNOSIS — Z79891 Long term (current) use of opiate analgesic: Secondary | ICD-10-CM | POA: Diagnosis not present

## 2019-04-11 DIAGNOSIS — I10 Essential (primary) hypertension: Secondary | ICD-10-CM | POA: Diagnosis not present

## 2019-04-11 DIAGNOSIS — Z85850 Personal history of malignant neoplasm of thyroid: Secondary | ICD-10-CM | POA: Diagnosis not present

## 2019-04-11 DIAGNOSIS — J432 Centrilobular emphysema: Secondary | ICD-10-CM | POA: Diagnosis not present

## 2019-04-11 DIAGNOSIS — Z4789 Encounter for other orthopedic aftercare: Secondary | ICD-10-CM | POA: Diagnosis not present

## 2019-04-11 DIAGNOSIS — M4726 Other spondylosis with radiculopathy, lumbar region: Secondary | ICD-10-CM | POA: Diagnosis not present

## 2019-04-11 DIAGNOSIS — M5116 Intervertebral disc disorders with radiculopathy, lumbar region: Secondary | ICD-10-CM | POA: Diagnosis not present

## 2019-04-11 DIAGNOSIS — E119 Type 2 diabetes mellitus without complications: Secondary | ICD-10-CM | POA: Diagnosis not present

## 2019-04-11 DIAGNOSIS — E890 Postprocedural hypothyroidism: Secondary | ICD-10-CM | POA: Diagnosis not present

## 2019-04-11 DIAGNOSIS — M150 Primary generalized (osteo)arthritis: Secondary | ICD-10-CM | POA: Diagnosis not present

## 2019-04-11 DIAGNOSIS — G894 Chronic pain syndrome: Secondary | ICD-10-CM | POA: Diagnosis not present

## 2019-04-11 DIAGNOSIS — Z86718 Personal history of other venous thrombosis and embolism: Secondary | ICD-10-CM | POA: Diagnosis not present

## 2019-04-11 DIAGNOSIS — M48062 Spinal stenosis, lumbar region with neurogenic claudication: Secondary | ICD-10-CM | POA: Diagnosis not present

## 2019-04-12 ENCOUNTER — Other Ambulatory Visit: Payer: Self-pay | Admitting: *Deleted

## 2019-04-12 NOTE — Patient Outreach (Signed)
[place] [facility]) Care Management  04/12/2019  DMARI HARLING 06-27-1950 [id number]    Telephone Assessment-Unsuccessful  RN attempted outreach call today however remains unsuccessful. Note outreach letter also sent with no response. RN able to leave a HIPAA approved voice message requesting a call back.  Plan: Will continue to follow up.  Raina Mina, RN Care Management Coordinator [place] Office [phone]

## 2019-04-18 DIAGNOSIS — M5116 Intervertebral disc disorders with radiculopathy, lumbar region: Secondary | ICD-10-CM | POA: Diagnosis not present

## 2019-04-18 DIAGNOSIS — M150 Primary generalized (osteo)arthritis: Secondary | ICD-10-CM | POA: Diagnosis not present

## 2019-04-18 DIAGNOSIS — Z981 Arthrodesis status: Secondary | ICD-10-CM | POA: Diagnosis not present

## 2019-04-18 DIAGNOSIS — E119 Type 2 diabetes mellitus without complications: Secondary | ICD-10-CM | POA: Diagnosis not present

## 2019-04-18 DIAGNOSIS — G894 Chronic pain syndrome: Secondary | ICD-10-CM | POA: Diagnosis not present

## 2019-04-18 DIAGNOSIS — Z86718 Personal history of other venous thrombosis and embolism: Secondary | ICD-10-CM | POA: Diagnosis not present

## 2019-04-18 DIAGNOSIS — E890 Postprocedural hypothyroidism: Secondary | ICD-10-CM | POA: Diagnosis not present

## 2019-04-18 DIAGNOSIS — M48062 Spinal stenosis, lumbar region with neurogenic claudication: Secondary | ICD-10-CM | POA: Diagnosis not present

## 2019-04-18 DIAGNOSIS — Z85850 Personal history of malignant neoplasm of thyroid: Secondary | ICD-10-CM | POA: Diagnosis not present

## 2019-04-18 DIAGNOSIS — Z7901 Long term (current) use of anticoagulants: Secondary | ICD-10-CM | POA: Diagnosis not present

## 2019-04-18 DIAGNOSIS — Z79891 Long term (current) use of opiate analgesic: Secondary | ICD-10-CM | POA: Diagnosis not present

## 2019-04-18 DIAGNOSIS — I10 Essential (primary) hypertension: Secondary | ICD-10-CM | POA: Diagnosis not present

## 2019-04-18 DIAGNOSIS — M4726 Other spondylosis with radiculopathy, lumbar region: Secondary | ICD-10-CM | POA: Diagnosis not present

## 2019-04-18 DIAGNOSIS — Z7984 Long term (current) use of oral hypoglycemic drugs: Secondary | ICD-10-CM | POA: Diagnosis not present

## 2019-04-18 DIAGNOSIS — J432 Centrilobular emphysema: Secondary | ICD-10-CM | POA: Diagnosis not present

## 2019-04-18 DIAGNOSIS — Z4789 Encounter for other orthopedic aftercare: Secondary | ICD-10-CM | POA: Diagnosis not present

## 2019-04-21 ENCOUNTER — Other Ambulatory Visit: Payer: Self-pay | Admitting: Internal Medicine

## 2019-04-21 DIAGNOSIS — F3341 Major depressive disorder, recurrent, in partial remission: Secondary | ICD-10-CM

## 2019-04-25 DIAGNOSIS — R6889 Other general symptoms and signs: Secondary | ICD-10-CM | POA: Diagnosis not present

## 2019-04-25 DIAGNOSIS — I2510 Atherosclerotic heart disease of native coronary artery without angina pectoris: Secondary | ICD-10-CM | POA: Diagnosis not present

## 2019-04-25 DIAGNOSIS — E789 Disorder of lipoprotein metabolism, unspecified: Secondary | ICD-10-CM | POA: Diagnosis not present

## 2019-04-26 DIAGNOSIS — G894 Chronic pain syndrome: Secondary | ICD-10-CM | POA: Diagnosis not present

## 2019-04-26 DIAGNOSIS — R6889 Other general symptoms and signs: Secondary | ICD-10-CM | POA: Diagnosis not present

## 2019-04-26 DIAGNOSIS — Z79891 Long term (current) use of opiate analgesic: Secondary | ICD-10-CM | POA: Diagnosis not present

## 2019-04-26 DIAGNOSIS — Z79899 Other long term (current) drug therapy: Secondary | ICD-10-CM | POA: Diagnosis not present

## 2019-04-27 DIAGNOSIS — Z4789 Encounter for other orthopedic aftercare: Secondary | ICD-10-CM | POA: Diagnosis not present

## 2019-04-27 DIAGNOSIS — Z981 Arthrodesis status: Secondary | ICD-10-CM | POA: Diagnosis not present

## 2019-04-27 DIAGNOSIS — I10 Essential (primary) hypertension: Secondary | ICD-10-CM | POA: Diagnosis not present

## 2019-04-27 DIAGNOSIS — M150 Primary generalized (osteo)arthritis: Secondary | ICD-10-CM | POA: Diagnosis not present

## 2019-04-27 DIAGNOSIS — Z7901 Long term (current) use of anticoagulants: Secondary | ICD-10-CM | POA: Diagnosis not present

## 2019-04-27 DIAGNOSIS — G894 Chronic pain syndrome: Secondary | ICD-10-CM | POA: Diagnosis not present

## 2019-04-27 DIAGNOSIS — M48062 Spinal stenosis, lumbar region with neurogenic claudication: Secondary | ICD-10-CM | POA: Diagnosis not present

## 2019-04-27 DIAGNOSIS — J432 Centrilobular emphysema: Secondary | ICD-10-CM | POA: Diagnosis not present

## 2019-04-27 DIAGNOSIS — Z79891 Long term (current) use of opiate analgesic: Secondary | ICD-10-CM | POA: Diagnosis not present

## 2019-04-27 DIAGNOSIS — M4726 Other spondylosis with radiculopathy, lumbar region: Secondary | ICD-10-CM | POA: Diagnosis not present

## 2019-04-27 DIAGNOSIS — E119 Type 2 diabetes mellitus without complications: Secondary | ICD-10-CM | POA: Diagnosis not present

## 2019-04-27 DIAGNOSIS — Z85850 Personal history of malignant neoplasm of thyroid: Secondary | ICD-10-CM | POA: Diagnosis not present

## 2019-04-27 DIAGNOSIS — Z86718 Personal history of other venous thrombosis and embolism: Secondary | ICD-10-CM | POA: Diagnosis not present

## 2019-04-27 DIAGNOSIS — M5116 Intervertebral disc disorders with radiculopathy, lumbar region: Secondary | ICD-10-CM | POA: Diagnosis not present

## 2019-04-27 DIAGNOSIS — Z7984 Long term (current) use of oral hypoglycemic drugs: Secondary | ICD-10-CM | POA: Diagnosis not present

## 2019-04-27 DIAGNOSIS — E890 Postprocedural hypothyroidism: Secondary | ICD-10-CM | POA: Diagnosis not present

## 2019-05-04 DIAGNOSIS — Z4789 Encounter for other orthopedic aftercare: Secondary | ICD-10-CM | POA: Diagnosis not present

## 2019-05-04 DIAGNOSIS — J432 Centrilobular emphysema: Secondary | ICD-10-CM | POA: Diagnosis not present

## 2019-05-04 DIAGNOSIS — M48062 Spinal stenosis, lumbar region with neurogenic claudication: Secondary | ICD-10-CM | POA: Diagnosis not present

## 2019-05-04 DIAGNOSIS — E119 Type 2 diabetes mellitus without complications: Secondary | ICD-10-CM | POA: Diagnosis not present

## 2019-05-04 DIAGNOSIS — Z85850 Personal history of malignant neoplasm of thyroid: Secondary | ICD-10-CM | POA: Diagnosis not present

## 2019-05-04 DIAGNOSIS — Z7984 Long term (current) use of oral hypoglycemic drugs: Secondary | ICD-10-CM | POA: Diagnosis not present

## 2019-05-04 DIAGNOSIS — Z79891 Long term (current) use of opiate analgesic: Secondary | ICD-10-CM | POA: Diagnosis not present

## 2019-05-04 DIAGNOSIS — M150 Primary generalized (osteo)arthritis: Secondary | ICD-10-CM | POA: Diagnosis not present

## 2019-05-04 DIAGNOSIS — E890 Postprocedural hypothyroidism: Secondary | ICD-10-CM | POA: Diagnosis not present

## 2019-05-04 DIAGNOSIS — Z86718 Personal history of other venous thrombosis and embolism: Secondary | ICD-10-CM | POA: Diagnosis not present

## 2019-05-04 DIAGNOSIS — Z981 Arthrodesis status: Secondary | ICD-10-CM | POA: Diagnosis not present

## 2019-05-04 DIAGNOSIS — M4726 Other spondylosis with radiculopathy, lumbar region: Secondary | ICD-10-CM | POA: Diagnosis not present

## 2019-05-04 DIAGNOSIS — Z7901 Long term (current) use of anticoagulants: Secondary | ICD-10-CM | POA: Diagnosis not present

## 2019-05-04 DIAGNOSIS — M5116 Intervertebral disc disorders with radiculopathy, lumbar region: Secondary | ICD-10-CM | POA: Diagnosis not present

## 2019-05-04 DIAGNOSIS — G894 Chronic pain syndrome: Secondary | ICD-10-CM | POA: Diagnosis not present

## 2019-05-04 DIAGNOSIS — I10 Essential (primary) hypertension: Secondary | ICD-10-CM | POA: Diagnosis not present

## 2019-05-10 DIAGNOSIS — M48062 Spinal stenosis, lumbar region with neurogenic claudication: Secondary | ICD-10-CM | POA: Diagnosis not present

## 2019-05-10 DIAGNOSIS — Z7901 Long term (current) use of anticoagulants: Secondary | ICD-10-CM | POA: Diagnosis not present

## 2019-05-10 DIAGNOSIS — E119 Type 2 diabetes mellitus without complications: Secondary | ICD-10-CM | POA: Diagnosis not present

## 2019-05-10 DIAGNOSIS — E890 Postprocedural hypothyroidism: Secondary | ICD-10-CM | POA: Diagnosis not present

## 2019-05-10 DIAGNOSIS — G894 Chronic pain syndrome: Secondary | ICD-10-CM | POA: Diagnosis not present

## 2019-05-10 DIAGNOSIS — J432 Centrilobular emphysema: Secondary | ICD-10-CM | POA: Diagnosis not present

## 2019-05-10 DIAGNOSIS — M4726 Other spondylosis with radiculopathy, lumbar region: Secondary | ICD-10-CM | POA: Diagnosis not present

## 2019-05-10 DIAGNOSIS — Z86718 Personal history of other venous thrombosis and embolism: Secondary | ICD-10-CM | POA: Diagnosis not present

## 2019-05-10 DIAGNOSIS — Z85850 Personal history of malignant neoplasm of thyroid: Secondary | ICD-10-CM | POA: Diagnosis not present

## 2019-05-10 DIAGNOSIS — Z981 Arthrodesis status: Secondary | ICD-10-CM | POA: Diagnosis not present

## 2019-05-10 DIAGNOSIS — M150 Primary generalized (osteo)arthritis: Secondary | ICD-10-CM | POA: Diagnosis not present

## 2019-05-10 DIAGNOSIS — I10 Essential (primary) hypertension: Secondary | ICD-10-CM | POA: Diagnosis not present

## 2019-05-10 DIAGNOSIS — Z79891 Long term (current) use of opiate analgesic: Secondary | ICD-10-CM | POA: Diagnosis not present

## 2019-05-10 DIAGNOSIS — Z4789 Encounter for other orthopedic aftercare: Secondary | ICD-10-CM | POA: Diagnosis not present

## 2019-05-10 DIAGNOSIS — M5116 Intervertebral disc disorders with radiculopathy, lumbar region: Secondary | ICD-10-CM | POA: Diagnosis not present

## 2019-05-10 DIAGNOSIS — Z7984 Long term (current) use of oral hypoglycemic drugs: Secondary | ICD-10-CM | POA: Diagnosis not present

## 2019-05-15 ENCOUNTER — Other Ambulatory Visit: Payer: Self-pay | Admitting: *Deleted

## 2019-05-15 NOTE — Patient Outreach (Signed)
[name] Care Management  05/15/2019  Lee Mitchell 1950/05/24 [id number]   Case Closure  RN unable to contact pt and no response to the outreach letter sent. Primary provider will be notified and case will be closed.  Raina Mina, RN Care Management Coordinator [place] Office [phone]

## 2019-05-24 DIAGNOSIS — M4326 Fusion of spine, lumbar region: Secondary | ICD-10-CM | POA: Diagnosis not present

## 2019-05-29 DIAGNOSIS — H2513 Age-related nuclear cataract, bilateral: Secondary | ICD-10-CM | POA: Diagnosis not present

## 2019-05-29 DIAGNOSIS — R6889 Other general symptoms and signs: Secondary | ICD-10-CM | POA: Diagnosis not present

## 2019-05-29 LAB — HM DIABETES EYE EXAM

## 2019-06-13 DIAGNOSIS — Z20822 Contact with and (suspected) exposure to covid-19: Secondary | ICD-10-CM | POA: Diagnosis not present

## 2019-06-13 DIAGNOSIS — R05 Cough: Secondary | ICD-10-CM | POA: Diagnosis not present

## 2019-06-16 ENCOUNTER — Emergency Department (HOSPITAL_COMMUNITY): Payer: Medicare Other

## 2019-06-16 ENCOUNTER — Emergency Department (HOSPITAL_COMMUNITY)
Admission: EM | Admit: 2019-06-16 | Discharge: 2019-06-16 | Disposition: A | Discharge status: Home / Self Care | Payer: Medicare Other | Attending: Emergency Medicine | Admitting: Emergency Medicine

## 2019-06-16 ENCOUNTER — Encounter (HOSPITAL_COMMUNITY): Payer: Self-pay | Admitting: *Deleted

## 2019-06-16 ENCOUNTER — Other Ambulatory Visit: Payer: Self-pay

## 2019-06-16 DIAGNOSIS — R079 Chest pain, unspecified: Secondary | ICD-10-CM | POA: Diagnosis not present

## 2019-06-16 DIAGNOSIS — S161XXA Strain of muscle, fascia and tendon at neck level, initial encounter: Secondary | ICD-10-CM | POA: Diagnosis not present

## 2019-06-16 DIAGNOSIS — S0990XA Unspecified injury of head, initial encounter: Secondary | ICD-10-CM

## 2019-06-16 DIAGNOSIS — Y999 Unspecified external cause status: Secondary | ICD-10-CM | POA: Diagnosis not present

## 2019-06-16 DIAGNOSIS — Z87891 Personal history of nicotine dependence: Secondary | ICD-10-CM | POA: Diagnosis not present

## 2019-06-16 DIAGNOSIS — W06XXXA Fall from bed, initial encounter: Secondary | ICD-10-CM | POA: Insufficient documentation

## 2019-06-16 DIAGNOSIS — S199XXA Unspecified injury of neck, initial encounter: Secondary | ICD-10-CM | POA: Diagnosis not present

## 2019-06-16 DIAGNOSIS — S169XXA Unspecified injury of muscle, fascia and tendon at neck level, initial encounter: Secondary | ICD-10-CM | POA: Insufficient documentation

## 2019-06-16 DIAGNOSIS — Y92003 Bedroom of unspecified non-institutional (private) residence as the place of occurrence of the external cause: Secondary | ICD-10-CM | POA: Diagnosis not present

## 2019-06-16 DIAGNOSIS — W19XXXA Unspecified fall, initial encounter: Secondary | ICD-10-CM

## 2019-06-16 DIAGNOSIS — S0083XA Contusion of other part of head, initial encounter: Secondary | ICD-10-CM | POA: Diagnosis not present

## 2019-06-16 DIAGNOSIS — E119 Type 2 diabetes mellitus without complications: Secondary | ICD-10-CM | POA: Insufficient documentation

## 2019-06-16 DIAGNOSIS — S299XXA Unspecified injury of thorax, initial encounter: Secondary | ICD-10-CM | POA: Diagnosis not present

## 2019-06-16 DIAGNOSIS — I10 Essential (primary) hypertension: Secondary | ICD-10-CM | POA: Diagnosis not present

## 2019-06-16 DIAGNOSIS — Y939 Activity, unspecified: Secondary | ICD-10-CM | POA: Insufficient documentation

## 2019-06-16 LAB — CBC WITH DIFFERENTIAL/PLATELET
Abs Immature Granulocytes: 0.03 10*3/uL (ref 0.00–0.07)
Basophils Absolute: 0.1 10*3/uL (ref 0.0–0.1)
Basophils Relative: 0 %
Eosinophils Absolute: 0.1 10*3/uL (ref 0.0–0.5)
Eosinophils Relative: 1 %
HCT: 36.6 % — ABNORMAL LOW (ref 39.0–52.0)
Hemoglobin: 11.7 g/dL — ABNORMAL LOW (ref 13.0–17.0)
Immature Granulocytes: 0 %
Lymphocytes Relative: 23 %
Lymphs Abs: 2.8 10*3/uL (ref 0.7–4.0)
MCH: 27.7 pg (ref 26.0–34.0)
MCHC: 32 g/dL (ref 30.0–36.0)
MCV: 86.7 fL (ref 80.0–100.0)
Monocytes Absolute: 0.8 10*3/uL (ref 0.1–1.0)
Monocytes Relative: 7 %
Neutro Abs: 8.3 10*3/uL — ABNORMAL HIGH (ref 1.7–7.7)
Neutrophils Relative %: 69 %
Platelets: 361 10*3/uL (ref 150–400)
RBC: 4.22 MIL/uL (ref 4.22–5.81)
RDW: 14.6 % (ref 11.5–15.5)
WBC: 12.1 10*3/uL — ABNORMAL HIGH (ref 4.0–10.5)
nRBC: 0 % (ref 0.0–0.2)

## 2019-06-16 LAB — COMPREHENSIVE METABOLIC PANEL
ALT: 13 U/L (ref 0–44)
AST: 14 U/L — ABNORMAL LOW (ref 15–41)
Albumin: 3.9 g/dL (ref 3.5–5.0)
Alkaline Phosphatase: 96 U/L (ref 38–126)
Anion gap: 8 (ref 5–15)
BUN: 17 mg/dL (ref 8–23)
CO2: 23 mmol/L (ref 22–32)
Calcium: 8.9 mg/dL (ref 8.9–10.3)
Chloride: 107 mmol/L (ref 98–111)
Creatinine, Ser: 0.77 mg/dL (ref 0.61–1.24)
GFR calc Af Amer: >60 mL/min (ref >60)
GFR calc non Af Amer: >60 mL/min (ref >60)
Glucose, Bld: 92 mg/dL (ref 70–99)
Potassium: 3.9 mmol/L (ref 3.5–5.1)
Sodium: 138 mmol/L (ref 135–145)
Total Bilirubin: 0.3 mg/dL (ref 0.3–1.2)
Total Protein: 7.4 g/dL (ref 6.5–8.1)

## 2019-06-16 LAB — TROPONIN I (HIGH SENSITIVITY)
Troponin I (High Sensitivity): 2 ng/L (ref <18)
Troponin I (High Sensitivity): <2 ng/L (ref <18)

## 2019-06-16 MED ORDER — ONDANSETRON HCL 4 MG/2ML IJ SOLN
4.0000 mg | Freq: Once | INTRAMUSCULAR | Status: AC
  Administered 2019-06-16: 4 mg via INTRAVENOUS
  Filled 2019-06-16: qty 2

## 2019-06-16 MED ORDER — DICLOFENAC SODIUM 1 % EX GEL: 2.0000 g | Freq: Four times a day (QID) | CUTANEOUS | 0 refills | Status: DC

## 2019-06-16 MED ORDER — OXYCODONE-ACETAMINOPHEN 5-325 MG PO TABS: 1.0000 | ORAL_TABLET | Freq: Four times a day (QID) | ORAL | 0 refills | Status: DC | PRN

## 2019-06-16 MED ORDER — OXYCODONE-ACETAMINOPHEN 5-325 MG PO TABS
1.0000 | ORAL_TABLET | Freq: Once | ORAL | Status: AC
  Administered 2019-06-16: 20:00:00 1 via ORAL
  Filled 2019-06-16: qty 1

## 2019-06-16 MED ORDER — LORAZEPAM 1 MG PO TABS
1.0000 mg | ORAL_TABLET | Freq: Once | ORAL | Status: AC | PRN
  Administered 2019-06-16: 21:00:00 1 mg via ORAL
  Filled 2019-06-16: qty 1

## 2019-06-16 MED ORDER — MORPHINE SULFATE (PF) 4 MG/ML IV SOLN
4.0000 mg | Freq: Once | INTRAVENOUS | Status: AC
  Administered 2019-06-16: 19:00:00 4 mg via INTRAVENOUS
  Filled 2019-06-16: qty 1

## 2019-06-16 NOTE — ED Notes (Signed)
MRI called 

## 2019-06-16 NOTE — ED Provider Notes (Signed)
Emergency Department Provider Note   I have reviewed the triage vital signs and the nursing notes.   HISTORY  Chief Complaint Fall and Head Injury   HPI Lee Mitchell is a 69 y.o. male presents to the emergency department for evaluation of fall at home with head injury and back pain.  Patient is no longer taking anticoagulation.  He reports history of lumbar spine surgery and has had his typical lower back pain today.  He tells me he had an episode of spasming type pain which caused him to fall and strike his head when getting out of bed.  He denies any numbness or generalized weakness.  He has not experienced fever or bowel/bladder symptoms.  He does think he may have passed out after hitting his head.  The initial injury occurred approximately 8 to 9 hours prior to ED evaluation.  He states he stayed home initially but developed more severe pain in his neck along with headache so called out for transport to the emergency department.   Past Medical History:  Diagnosis Date  . Chronic abdominal pain   . Colon polyps   . PNA (pneumonia)   . Thyroid cancer [facility])     Patient Active Problem List   Diagnosis Date Noted  . Thallium stress test abnormal 12/22/2018  . Tachycardia 12/14/2018  . DOE (dyspnea on exertion) 12/14/2018  . Abnormal electrocardiogram (ECG) (EKG) 12/14/2018  . Abnormal electrocardiogram 12/14/2018  . Deep vein thrombosis (DVT) of calf muscle vein of left lower extremity ([place]) 12/14/2018  . Encounter for screening for HIV 07/12/2018  . Drug-seeking behavior 05/21/2018  . Opiates and related narcotics causing adverse effect in therapeutic use, initial encounter 05/21/2018  . Left rotator cuff tear 05/18/2018  . Cannabis abuse 04/29/2018  . 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 Type II diabetes mellitus with manifestations ([place]) 04/26/2018  . Hyperlipidemia LDL goal <100 04/26/2018  . Routine general medical examination at a health care facility  04/26/2018  . 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 PSA elevation 04/26/2018  . Hypertension 04/26/2018  . Benign prostatic hyperplasia without lower urinary tract symptoms 04/26/2018  . Encounter for Cong [place]-term opiate analgesic use 04/26/2018  . Colon cancer screening 04/26/2018  . Centrilobular emphysema ([place]) 04/03/2018  . MDD (major depressive disorder), recurrent episode, severe ([place]) 02/15/2017  . Chronic pain syndrome 02/15/2017  . GERD 03/22/2010    Past Surgical History:  Procedure Laterality Date  . ANKLE ARTHROSCOPY WITH RECONSTRUCTION    . CHOLECYSTECTOMY    . COLONOSCOPY    . ELBOW ARTHROSCOPY WITH TENDON RECONSTRUCTION    . SHOULDER ARTHROSCOPY WITH LABRAL REPAIR    . THYROIDECTOMY     2004/2005  . ULNAR NERVE TRANSPOSITION      Allergies Patient has no known allergies.  Family History  Problem Relation Age of Onset  . Kidney cancer Father   . Hypertension Father   . Hypertension Mother   . Hyperthyroidism Mother   . Cancer Mother 33       thyroid cancer  . Thyroid cancer Maternal Uncle     Social History Social History   Tobacco Use  . Smoking status: Former Smoker    Packs/day: 0.25    Years: 35.00    Pack years: 8.75    Types: Cigarettes  . Smokeless tobacco: Never Used  Substance Use Topics  . Alcohol use: Not Currently    Alcohol/week: 0.0 standard drinks  . Drug use: Yes    Types:  Marijuana    Review of Systems  Constitutional: No fever/chills Eyes: No visual changes. ENT: No sore throat. Cardiovascular: Denies chest pain. Respiratory: Denies shortness of breath. Gastrointestinal: No abdominal pain.  Genitourinary: Negative for dysuria. Musculoskeletal: Positive neck pain (new). Lower back pain unchanged from surgery.  Skin: Negative for rash. Neurological: Negative for focal weakness or numbness. Positive HA.   10-point ROS otherwise negative.  ____________________________________________   PHYSICAL  EXAM:  VITAL SIGNS: ED Triage Vitals  Enc Vitals Group     BP 06/16/19 1809 (!) 141/85     Pulse Rate 06/16/19 1809 94     Resp 06/16/19 1809 (!) 23     Temp 06/16/19 1809 98 F (36.7 C)     Temp Source 06/16/19 1809 Oral     SpO2 06/16/19 1809 98 %     Weight 06/16/19 1810 180 lb (81.6 kg)     Height 06/16/19 1810 6\' 2"  (1.88 m)   Constitutional: Alert and oriented. Well appearing and in no acute distress. Eyes: Conjunctivae are normal.  Head: Two forehead lacerations (shallow) and well-clotted. No hematoma.  Nose: No congestion/rhinnorhea. Mouth/Throat: Mucous membranes are moist.  Neck: No stridor. C collar in place.  Cardiovascular: Normal rate, regular rhythm. Good peripheral circulation. Grossly normal heart sounds.   Respiratory: Normal respiratory effort.  No retractions. Lungs CTAB. Gastrointestinal: Soft and nontender. No distention.  Musculoskeletal: No lower extremity tenderness nor edema. No gross deformities of extremities. No midline thoracic or lumbar spine tenderness.  Neurologic:  Normal speech and language. No gross focal neurologic deficits are appreciated.  Skin:  Skin is warm, dry and intact. No rash noted. Forehead lacerations as above.    ____________________________________________   LABS (all labs ordered are listed, but only abnormal results are displayed)  Labs Reviewed  COMPREHENSIVE METABOLIC PANEL - Abnormal; Notable for the following components:      Result Value   AST 14 (*)    All other components within normal limits  CBC WITH DIFFERENTIAL/PLATELET - Abnormal; Notable for the following components:   WBC 12.1 (*)    Hemoglobin 11.7 (*)    HCT 36.6 (*)    Neutro Abs 8.3 (*)    All other components within normal limits  TROPONIN I (HIGH SENSITIVITY)  TROPONIN I (HIGH SENSITIVITY)   ____________________________________________  EKG   EKG Interpretation  Date/Time:  Saturday June 16 2019 18:27:27 EST Ventricular Rate:  93 PR  Interval:    QRS Duration: 110 QT Interval:  333 QTC Calculation: 415 R Axis:   -4 Text Interpretation: Sinus rhythm Abnormal R-wave progression, early transition Baseline wander in lead(s) V2 Similar to prior. No STEMI Confirmed by Nanda Quinton [phone]) on 06/16/2019 6:31:46 PM       ____________________________________________  RADIOLOGY  CT Head Wo Contrast  Result Date: 06/16/2019 CLINICAL DATA:  Head trauma, headache, fall from bed EXAM: CT HEAD WITHOUT CONTRAST CT CERVICAL SPINE WITHOUT CONTRAST TECHNIQUE: Multidetector CT imaging of the head and cervical spine was performed following the standard protocol without intravenous contrast. Multiplanar CT image reconstructions of the cervical spine were also generated. COMPARISON:  None. FINDINGS: CT HEAD FINDINGS Brain: No evidence of acute infarction, hemorrhage, hydrocephalus, extra-axial collection or mass lesion/mass effect. Mild periventricular white matter hypodensity. Focal hypodensity of the left corona radiata adjacent to the caudate head (series 1, image 18). Vascular: No hyperdense vessel or unexpected calcification. Skull: Normal. Negative for fracture or focal lesion. Sinuses/Orbits: No acute finding. Other: Soft tissue laceration and hematoma of  the forehead. CT CERVICAL SPINE FINDINGS Alignment: Normal. Skull base and vertebrae: No acute fracture. No primary bone lesion or focal pathologic process. Soft tissues and spinal canal: No prevertebral fluid or swelling. No visible canal hematoma. Disc levels: Moderate to severe multilevel disc degenerative disease and osteophytosis of the lower cervical levels. Upper chest: Negative. Other: Surgical clips in the vicinity of the right thyroid. IMPRESSION: 1. No acute intracranial pathology. 2. Focal hypodensity of the left corona radiata adjacent to the caudate head, consistent with age-indeterminate lacunar infarction. MRI may be used to more sensitively evaluate for acute diffusion  restricting infarction if clinically suspected. 3. Soft tissue laceration and hematoma of the forehead. 4. No fracture or static subluxation of the cervical spine. 5. Moderate to severe multilevel disc degenerative disease of the lower cervical levels. Electronically Signed   By: Eddie Candle M.D.   On: 06/16/2019 18:55   CT Cervical Spine Wo Contrast  Result Date: 06/16/2019 CLINICAL DATA:  Head trauma, headache, fall from bed EXAM: CT HEAD WITHOUT CONTRAST CT CERVICAL SPINE WITHOUT CONTRAST TECHNIQUE: Multidetector CT imaging of the head and cervical spine was performed following the standard protocol without intravenous contrast. Multiplanar CT image reconstructions of the cervical spine were also generated. COMPARISON:  None. FINDINGS: CT HEAD FINDINGS Brain: No evidence of acute infarction, hemorrhage, hydrocephalus, extra-axial collection or mass lesion/mass effect. Mild periventricular white matter hypodensity. Focal hypodensity of the left corona radiata adjacent to the caudate head (series 1, image 18). Vascular: No hyperdense vessel or unexpected calcification. Skull: Normal. Negative for fracture or focal lesion. Sinuses/Orbits: No acute finding. Other: Soft tissue laceration and hematoma of the forehead. CT CERVICAL SPINE FINDINGS Alignment: Normal. Skull base and vertebrae: No acute fracture. No primary bone lesion or focal pathologic process. Soft tissues and spinal canal: No prevertebral fluid or swelling. No visible canal hematoma. Disc levels: Moderate to severe multilevel disc degenerative disease and osteophytosis of the lower cervical levels. Upper chest: Negative. Other: Surgical clips in the vicinity of the right thyroid. IMPRESSION: 1. No acute intracranial pathology. 2. Focal hypodensity of the left corona radiata adjacent to the caudate head, consistent with age-indeterminate lacunar infarction. MRI may be used to more sensitively evaluate for acute diffusion restricting infarction if  clinically suspected. 3. Soft tissue laceration and hematoma of the forehead. 4. No fracture or static subluxation of the cervical spine. 5. Moderate to severe multilevel disc degenerative disease of the lower cervical levels. Electronically Signed   By: Eddie Candle M.D.   On: 06/16/2019 18:55   MR BRAIN WO CONTRAST  Result Date: 06/16/2019 CLINICAL DATA:  [place] getting out of bed.  Possible syncopal episode. EXAM: MRI HEAD WITHOUT CONTRAST TECHNIQUE: Multiplanar, multiecho pulse sequences of the brain and surrounding structures were obtained without intravenous contrast. COMPARISON:  Head CT same day.  MRI 03/21/2010. FINDINGS: Brain: Diffusion imaging does not show any acute or subacute infarction. No focal abnormality affects the brainstem or cerebellum. Cerebral hemispheres show an old infarction in the left caudate head and adjacent white matter tracks. Minimal small-vessel ischemic change of the hemispheric white matter. No cortical or large vessel territory infarction. No mass lesion, hemorrhage, hydrocephalus or extra-axial collection. Vascular: Major vessels at the base of the brain show flow. Skull and upper cervical spine: Negative Sinuses/Orbits: Mucosal thickening of the paranasal sinuses without advanced sinusitis. Orbits negative. Other: None IMPRESSION: No acute or traumatic finding. 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 Minimal small vessel change of  the hemispheric white matter elsewhere. Those findings are similar to the study of 2011. Electronically Signed   By: Nelson Chimes M.D.   On: 06/16/2019 22:09   MR Cervical Spine Wo Contrast  Result Date: 06/16/2019 CLINICAL DATA:  [place] from bed. Cervical spondylosis without visible fracture by CT. EXAM: MRI CERVICAL SPINE WITHOUT CONTRAST TECHNIQUE: Multiplanar, multisequence MR imaging of the cervical spine was performed. No intravenous contrast was administered. COMPARISON:  CT same day. FINDINGS: Alignment:  No malalignment. Vertebrae: Chronic hemangioma within the T1 vertebral body with some chronic loss of height at the endplate. This does not look like an acute fracture by CT or MRI. Cord: No cord compression or primary cord lesion. See below regarding stenosis. Posterior Fossa, vertebral arteries, paraspinal tissues: Negative Disc levels: No abnormality at the foramen magnum or C1-2. C2-3: Chronic facet fusion on the left. No disc space pathology. Wide patency of the canal and foramina. C3-4: Chronic degenerative spondylosis with endplate osteophytes and bulging of the disc. Bilateral facet arthropathy worse on the right. Small effusion and some edema associated with the right facet. Spinal stenosis with effacement of the subarachnoid space surrounding the cord but no cord compression. AP diameter of the canal in the midline 10 mm. Bilateral foraminal stenosis that could affect either C4 nerve. C4-5: No disc pathology. Bilateral facet osteoarthritis. No canal stenosis. Bilateral foraminal narrowing, worse on the left, which could affect either C5 nerve. C5-6: Spondylosis with endplate osteophytes and bulging of the disc. Narrowing of the canal with AP diameter in the midline measuring 10 mm. No cord compression. Mild facet osteoarthritis. Bilateral foraminal stenosis could affect either C6 nerve. C6-7: Mild bulging of the disc. Narrowing of the ventral subarachnoid space but no compression of the cord. Mild foraminal narrowing on the left and moderate foraminal narrowing on the right. C7-T1: Minimal disc bulge. No facet arthropathy. No canal or foraminal stenosis. Prominent anterior bridging osteophytes, better shown by CT. Hemangioma within the T1 vertebral body with old appearing superior endplate depression as described above. IMPRESSION: No acute or traumatic cervical spine finding. Hemangioma affecting the T1 vertebral body. Old appearing superior endplate depression. This does not look like an acute fracture  by CT or MRI. Chronic facet fusion on the left at C2-3. Facet arthropathy and spondylosis from C3-4 through C6-7 as outlined above. Canal narrowing without cord compression. Foraminal stenosis that could cause neural compression bilaterally at C3-4, bilaterally at C4-5, bilaterally at C5-6 and left more than right at C6-7. The facet arthropathy could also be painful, particularly on the right at C3-4 where there is a small effusion and some edema. Electronically Signed   By: Nelson Chimes M.D.   On: 06/16/2019 22:00   DG Chest Portable 1 View  Result Date: 06/16/2019 CLINICAL DATA:  Posterior chest pain, fell EXAM: PORTABLE CHEST 1 VIEW COMPARISON:  03/18/2019 FINDINGS: Single frontal view of the chest demonstrates an unremarkable cardiac silhouette. No airspace disease, effusion, or pneumothorax. No acute displaced fractures. IMPRESSION: 1. No acute intrathoracic process. Electronically Signed   By: Randa Ngo M.D.   On: 06/16/2019 18:55    ____________________________________________   PROCEDURES  Procedure(s) performed:   Procedures  None  ____________________________________________   INITIAL IMPRESSION / ASSESSMENT AND PLAN / ED COURSE  Pertinent labs & imaging results that were available during my care of the patient were reviewed by me and considered in my medical decision making (see chart for details).   Patient presents to the emergency department for evaluation  of fall at home.  No syncope type symptoms or presentation.  Patient may have lost consciousness after striking his head but is unsure.   CT head and c-spine reviewed. Age indeterminate infarct noted.  Plan for MRI here with somewhat atypical presentation and continued neck pain after CT but no midline tenderness.   10:43 PM  Patient's MRI reviewed. No acute CVA. Neuro foraminal narrowing on C spine MRI. No cord compression. Plan for pain mgmt at home and continue spine surgery follow up. [place] drug database reviewed.     I reviewed all nursing notes, vitals, pertinent old records, EKGs, labs, imaging (as available).   ____________________________________________  FINAL CLINICAL IMPRESSION(S) / ED DIAGNOSES  Final diagnoses:  Injury of head, initial encounter  Fall, initial encounter  Strain of neck muscle, initial encounter     MEDICATIONS GIVEN DURING THIS VISIT:  Medications  morphine 4 MG/ML injection 4 mg (4 mg Intravenous Given 06/16/19 1832)  ondansetron (ZOFRAN) injection 4 mg (4 mg Intravenous Given 06/16/19 1832)  oxyCODONE-acetaminophen (PERCOCET/ROXICET) 5-325 MG per tablet 1 tablet (1 tablet Oral Given 06/16/19 2014)  LORazepam (ATIVAN) tablet 1 mg (1 mg Oral Given 06/16/19 2112)     NEW OUTPATIENT MEDICATIONS STARTED DURING THIS VISIT:  New Prescriptions   DICLOFENAC SODIUM (VOLTAREN) 1 % GEL    Apply 2 g topically 4 (four) times daily.   OXYCODONE-ACETAMINOPHEN (PERCOCET/ROXICET) 5-325 MG TABLET    Take 1 tablet by mouth every 6 (six) hours as needed for severe pain.    Note:  This document was prepared using Dragon voice recognition software and may include unintentional dictation errors.  Nanda Quinton, MD, [facility]    Inez Stantz, [facility], MD 06/16/19 [phone]

## 2019-06-16 NOTE — ED Notes (Signed)
PT transported to MRI

## 2019-06-16 NOTE — ED Triage Notes (Addendum)
Pt fell getting out of bed, unsure of syncope, hit forehead on window seal ?, pt has back pain that is worse with movement.C/O shob and difficulty breathing. Placed in C collar, headache and laceration to front of head, bleeding controlled

## 2019-06-16 NOTE — Discharge Instructions (Signed)

## 2019-06-21 DIAGNOSIS — R6889 Other general symptoms and signs: Secondary | ICD-10-CM | POA: Diagnosis not present

## 2019-06-21 DIAGNOSIS — F5101 Primary insomnia: Secondary | ICD-10-CM | POA: Diagnosis not present

## 2019-06-21 DIAGNOSIS — M5412 Radiculopathy, cervical region: Secondary | ICD-10-CM | POA: Diagnosis not present

## 2019-06-21 DIAGNOSIS — K219 Gastro-esophageal reflux disease without esophagitis: Secondary | ICD-10-CM | POA: Diagnosis not present

## 2019-06-21 DIAGNOSIS — W19XXXD Unspecified fall, subsequent encounter: Secondary | ICD-10-CM | POA: Diagnosis not present

## 2019-06-24 ENCOUNTER — Emergency Department (HOSPITAL_COMMUNITY)
Admission: EM | Admit: 2019-06-24 | Discharge: 2019-06-24 | Discharge status: Against Medical Advice | Payer: Medicare Other | Attending: Emergency Medicine | Admitting: Emergency Medicine

## 2019-06-24 ENCOUNTER — Other Ambulatory Visit: Payer: Self-pay

## 2019-06-24 ENCOUNTER — Emergency Department (HOSPITAL_COMMUNITY): Payer: Medicare Other

## 2019-06-24 ENCOUNTER — Encounter (HOSPITAL_COMMUNITY): Payer: Self-pay

## 2019-06-24 DIAGNOSIS — I4510 Unspecified right bundle-branch block: Secondary | ICD-10-CM | POA: Diagnosis not present

## 2019-06-24 DIAGNOSIS — G459 Transient cerebral ischemic attack, unspecified: Secondary | ICD-10-CM | POA: Insufficient documentation

## 2019-06-24 DIAGNOSIS — Z85850 Personal history of malignant neoplasm of thyroid: Secondary | ICD-10-CM | POA: Insufficient documentation

## 2019-06-24 DIAGNOSIS — R4789 Other speech disturbances: Secondary | ICD-10-CM | POA: Diagnosis not present

## 2019-06-24 DIAGNOSIS — Z79899 Other long term (current) drug therapy: Secondary | ICD-10-CM | POA: Insufficient documentation

## 2019-06-24 DIAGNOSIS — R4701 Aphasia: Secondary | ICD-10-CM | POA: Diagnosis present

## 2019-06-24 DIAGNOSIS — Z03818 Encounter for observation for suspected exposure to other biological agents ruled out: Secondary | ICD-10-CM | POA: Diagnosis not present

## 2019-06-24 DIAGNOSIS — I10 Essential (primary) hypertension: Secondary | ICD-10-CM | POA: Insufficient documentation

## 2019-06-24 DIAGNOSIS — R0902 Hypoxemia: Secondary | ICD-10-CM | POA: Diagnosis not present

## 2019-06-24 DIAGNOSIS — Z743 Need for continuous supervision: Secondary | ICD-10-CM | POA: Diagnosis not present

## 2019-06-24 DIAGNOSIS — Z7984 Long term (current) use of oral hypoglycemic drugs: Secondary | ICD-10-CM | POA: Diagnosis not present

## 2019-06-24 DIAGNOSIS — E119 Type 2 diabetes mellitus without complications: Secondary | ICD-10-CM | POA: Diagnosis not present

## 2019-06-24 DIAGNOSIS — Z5320 Procedure and treatment not carried out because of patient's decision for unspecified reasons: Secondary | ICD-10-CM | POA: Insufficient documentation

## 2019-06-24 DIAGNOSIS — Z20822 Contact with and (suspected) exposure to covid-19: Secondary | ICD-10-CM | POA: Diagnosis not present

## 2019-06-24 DIAGNOSIS — R29810 Facial weakness: Secondary | ICD-10-CM | POA: Diagnosis not present

## 2019-06-24 DIAGNOSIS — R531 Weakness: Secondary | ICD-10-CM | POA: Diagnosis not present

## 2019-06-24 LAB — PROTIME-INR
INR: 0.9 (ref 0.8–1.2)
Prothrombin Time: 12.4 seconds (ref 11.4–15.2)

## 2019-06-24 LAB — COMPREHENSIVE METABOLIC PANEL
ALT: 15 U/L (ref 0–44)
AST: 15 U/L (ref 15–41)
Albumin: 3.8 g/dL (ref 3.5–5.0)
Alkaline Phosphatase: 106 U/L (ref 38–126)
Anion gap: 10 (ref 5–15)
BUN: 26 mg/dL — ABNORMAL HIGH (ref 8–23)
CO2: 22 mmol/L (ref 22–32)
Calcium: 8.7 mg/dL — ABNORMAL LOW (ref 8.9–10.3)
Chloride: 106 mmol/L (ref 98–111)
Creatinine, Ser: 0.96 mg/dL (ref 0.61–1.24)
GFR calc Af Amer: >60 mL/min (ref >60)
GFR calc non Af Amer: >60 mL/min (ref >60)
Glucose, Bld: 109 mg/dL — ABNORMAL HIGH (ref 70–99)
Potassium: 4 mmol/L (ref 3.5–5.1)
Sodium: 138 mmol/L (ref 135–145)
Total Bilirubin: 0.6 mg/dL (ref 0.3–1.2)
Total Protein: 7.2 g/dL (ref 6.5–8.1)

## 2019-06-24 LAB — I-STAT CHEM 8, ED
BUN: 27 mg/dL — ABNORMAL HIGH (ref 8–23)
Calcium, Ion: 1.08 mmol/L — ABNORMAL LOW (ref 1.15–1.40)
Chloride: 105 mmol/L (ref 98–111)
Creatinine, Ser: 1 mg/dL (ref 0.61–1.24)
Glucose, Bld: 104 mg/dL — ABNORMAL HIGH (ref 70–99)
HCT: 37 % — ABNORMAL LOW (ref 39.0–52.0)
Hemoglobin: 12.6 g/dL — ABNORMAL LOW (ref 13.0–17.0)
Potassium: 4 mmol/L (ref 3.5–5.1)
Sodium: 138 mmol/L (ref 135–145)
TCO2: 27 mmol/L (ref 22–32)

## 2019-06-24 LAB — URINALYSIS, ROUTINE W REFLEX MICROSCOPIC
Bilirubin Urine: NEGATIVE
Glucose, UA: NEGATIVE mg/dL
Hgb urine dipstick: NEGATIVE
Ketones, ur: NEGATIVE mg/dL
Leukocytes,Ua: NEGATIVE
Nitrite: NEGATIVE
Protein, ur: NEGATIVE mg/dL
Specific Gravity, Urine: 1.026 (ref 1.005–1.030)
pH: 5 (ref 5.0–8.0)

## 2019-06-24 LAB — DIFFERENTIAL
Abs Immature Granulocytes: 0.03 10*3/uL (ref 0.00–0.07)
Basophils Absolute: 0.1 10*3/uL (ref 0.0–0.1)
Basophils Relative: 1 %
Eosinophils Absolute: 0.2 10*3/uL (ref 0.0–0.5)
Eosinophils Relative: 2 %
Immature Granulocytes: 0 %
Lymphocytes Relative: 23 %
Lymphs Abs: 2.4 10*3/uL (ref 0.7–4.0)
Monocytes Absolute: 0.7 10*3/uL (ref 0.1–1.0)
Monocytes Relative: 7 %
Neutro Abs: 7 10*3/uL (ref 1.7–7.7)
Neutrophils Relative %: 67 %

## 2019-06-24 LAB — RAPID URINE DRUG SCREEN, HOSP PERFORMED
Amphetamines: NOT DETECTED
Barbiturates: NOT DETECTED
Benzodiazepines: NOT DETECTED
Cocaine: NOT DETECTED
Opiates: NOT DETECTED
Tetrahydrocannabinol: POSITIVE — AB

## 2019-06-24 LAB — CBC
HCT: 38 % — ABNORMAL LOW (ref 39.0–52.0)
Hemoglobin: 11.9 g/dL — ABNORMAL LOW (ref 13.0–17.0)
MCH: 27.2 pg (ref 26.0–34.0)
MCHC: 31.3 g/dL (ref 30.0–36.0)
MCV: 87 fL (ref 80.0–100.0)
Platelets: 396 10*3/uL (ref 150–400)
RBC: 4.37 MIL/uL (ref 4.22–5.81)
RDW: 14.8 % (ref 11.5–15.5)
WBC: 10.4 10*3/uL (ref 4.0–10.5)
nRBC: 0 % (ref 0.0–0.2)

## 2019-06-24 LAB — APTT: aPTT: 31 seconds (ref 24–36)

## 2019-06-24 LAB — SARS CORONAVIRUS 2 (TAT 6-24 HRS): SARS Coronavirus 2: NEGATIVE

## 2019-06-24 LAB — ETHANOL: Alcohol, Ethyl (B): <10 mg/dL (ref <10)

## 2019-06-24 NOTE — ED Notes (Signed)
Pt transported to CT ?

## 2019-06-24 NOTE — Discharge Instructions (Addendum)
It appears that you have had a transient ischemic attack.  This can be a warning sign for a major stroke.  You are choosing to leave without completing care by staying in the hospital.  It is important to follow-up with your primary care doctor for further evaluation and treatment as soon as possible.  You can also return here whenever you need to for further evaluation and treatment.

## 2019-06-24 NOTE — ED Provider Notes (Signed)
[facility] DEPARTMENT Provider Note   CSN: [id number] Arrival date & time: 06/24/19  1518     History Chief Complaint  Patient presents with  . Aphasia    Lee Mitchell is a 69 y.o. male.  HPI He presents for evaluation of a period of difficulty talking, which started today, but is improving.  Onset of symptoms about 2 hours ago.  He went to an urgent care who directed him here for further evaluation.  He denies weakness, gait disorder, fever, chills, nausea, vomiting, neck or new back pain.  He is recovering from back surgery 2 months ago.  He had his first Covid vaccine, several days ago.  No known current sick contacts.  He had a fall about a week ago injuring his forehead and face, and was evaluated in the ED at that time.  He did not have any acute findings so was discharged with symptomatic treatment.  He saw his PCP, 3 days ago and was started on tramadol and prednisone for left-sided cervical radiculopathy.  There are no other known modifying factors.    Past Medical History:  Diagnosis Date  . Chronic abdominal pain   . Colon polyps   . PNA (pneumonia)   . Thyroid cancer [facility])     Patient Active Problem List   Diagnosis Date Noted  . Thallium stress test abnormal 12/22/2018  . Tachycardia 12/14/2018  . DOE (dyspnea on exertion) 12/14/2018  . Abnormal electrocardiogram (ECG) (EKG) 12/14/2018  . Abnormal electrocardiogram 12/14/2018  . Deep vein thrombosis (DVT) of calf muscle vein of left lower extremity ([place]) 12/14/2018  . Encounter for screening for HIV 07/12/2018  . Drug-seeking behavior 05/21/2018  . Opiates and related narcotics causing adverse effect in therapeutic use, initial encounter 05/21/2018  . Left rotator cuff tear 05/18/2018  . Cannabis abuse 04/29/2018  . 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 Type II diabetes mellitus with manifestations ([place]) 04/26/2018  . Hyperlipidemia LDL goal <100 04/26/2018  . Routine  general medical examination at a health care facility 04/26/2018  . 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 PSA elevation 04/26/2018  . Hypertension 04/26/2018  . Benign prostatic hyperplasia without lower urinary tract symptoms 04/26/2018  . Encounter for long-term opiate analgesic use 04/26/2018  . Colon cancer screening 04/26/2018  . Centrilobular emphysema ([place]) 04/03/2018  . MDD (major depressive disorder), recurrent episode, severe ([place]) 02/15/2017  . Chronic pain syndrome 02/15/2017  . GERD 03/22/2010    Past Surgical History:  Procedure Laterality Date  . ANKLE ARTHROSCOPY WITH RECONSTRUCTION    . CHOLECYSTECTOMY    . COLONOSCOPY    . ELBOW ARTHROSCOPY WITH TENDON RECONSTRUCTION    . SHOULDER ARTHROSCOPY WITH LABRAL REPAIR    . THYROIDECTOMY     2004/2005  . ULNAR NERVE TRANSPOSITION         Family History  Problem Relation Age of Onset  . Kidney cancer Father   . Hypertension Father   . Hypertension Mother   . Hyperthyroidism Mother   . Cancer Mother 58       thyroid cancer  . Thyroid cancer Maternal Uncle     Social History   Tobacco Use  . Smoking status: Former Smoker    Packs/day: 0.25    Years: 35.00    Pack years: 8.75    Types: Cigarettes  . Smokeless tobacco: Never Used  Substance Use Topics  . Alcohol use: Not Currently    Alcohol/week: 0.0 standard drinks  .  Drug use: Yes    Types: Marijuana    Home Medications Prior to Admission medications   Medication Sig Start Date End Date Taking? Authorizing Provider  acetaminophen (TYLENOL) 500 MG tablet Take 500 mg by mouth every 6 (six) hours as needed for mild pain, moderate pain or headache.    [provider]  albuterol (VENTOLIN HFA) 108 (90 Base) MCG/ACT inhaler Inhale 2 puffs into the lungs every 6 (six) hours as needed for wheezing or shortness of breath. 06/13/19 07/13/19  [provider]  Artificial Tear Ointment (DRY EYES OP) Apply 1 drop to eye as  needed (dry eyes).    [provider]  diclofenac Sodium (VOLTAREN) 1 % GEL Apply 2 g topically 4 (four) times daily. 06/16/19   Long, Wonda Olds, MD  ibuprofen (ADVIL) 200 MG tablet Take 200 mg by mouth every 6 (six) hours as needed for headache or mild pain.    [provider]  levothyroxine (SYNTHROID) 175 MCG tablet TAKE 1 TABLET (175 MCG TOTAL) BY MOUTH DAILY BEFORE BREAKFAST. 04/07/19   [name], MD  metFORMIN (GLUCOPHAGE) 500 MG tablet Take 500 mg by mouth daily with breakfast. 04/04/19   [provider]  methocarbamol (ROBAXIN) 750 MG tablet Take 750 mg by mouth 3 (three) times daily as needed for muscle spasms.    [provider]  mirtazapine (REMERON SOL-TAB) 30 MG disintegrating tablet DISSOLVE 1 TABLET UNDER TONGUE AT BEDTIME Patient taking differently: Take 30 mg by mouth at bedtime. Dissolve 1 tablet (30mg ) under tongue at bedtime 04/22/19   [name], MD  omeprazole (PRILOSEC) 40 MG capsule TAKE 1 CAPSULE BY MOUTH TWICE A DAY Patient taking differently: Take 40 mg by mouth in the morning and at bedtime.  02/06/19   [name], MD  oxyCODONE-acetaminophen (PERCOCET/ROXICET) 5-325 MG tablet Take 1 tablet by mouth every 6 (six) hours as needed for severe pain. 06/16/19   Long, Wonda Olds, MD  Pitavastatin Calcium (LIVALO) 4 MG TABS Take 1 tablet (4 mg total) by mouth daily. 12/14/18   [name], MD  rosuvastatin (CRESTOR) 40 MG tablet Take 40 mg by mouth at bedtime.  02/22/19   [provider]  umeclidinium-vilanterol (ANORO ELLIPTA) 62.5-25 MCG/INH AEPB Inhale 1 puff into the lungs daily. 04/03/18   Rigoberto Noel, MD    Allergies    Patient has no known allergies.  Review of Systems   Review of Systems  All other systems reviewed and are negative.   Physical Exam Updated Vital Signs BP 127/81   Pulse 76   Temp 98.2 F (36.8 C) (Oral)   Resp 20   Ht 6\' 2"  (1.88 m)   Wt 79.8 kg   SpO2 92%   BMI 22.60 kg/m    Physical Exam Vitals and nursing note reviewed.  Constitutional:      General: He is not in acute distress.    Appearance: He is well-developed. He is not ill-appearing, toxic-appearing or diaphoretic.  HENT:     Head: Normocephalic and atraumatic.     Right Ear: External ear normal.     Left Ear: External ear normal.  Eyes:     Conjunctiva/sclera: Conjunctivae normal.     Pupils: Pupils are equal, round, and reactive to light.  Neck:     Trachea: Phonation normal.  Cardiovascular:     Rate and Rhythm: Normal rate and regular rhythm.     Heart sounds: Normal heart sounds.  Pulmonary:  Effort: Pulmonary effort is normal.     Breath sounds: Normal breath sounds.  Abdominal:     Palpations: Abdomen is soft.     Tenderness: There is no abdominal tenderness.  Musculoskeletal:        General: No swelling or tenderness. Normal range of motion.     Cervical back: Normal range of motion and neck supple.     Comments: Wearing a lumbar corset  Skin:    General: Skin is warm and dry.  Neurological:     Mental Status: He is alert and oriented to person, place, and time.     Cranial Nerves: No cranial nerve deficit.     Sensory: No sensory deficit.     Motor: No abnormal muscle tone.     Coordination: Coordination normal.     Comments: No dysarthria, or aphasia.  Occasional, very mild, word finding noted during conversational speech.  No pronator drift or ataxia.  Normal recent and remote memory.  Psychiatric:        Mood and Affect: Mood normal.        Behavior: Behavior normal.        Thought Content: Thought content normal.        Judgment: Judgment normal.     Comments: He is lucid     ED Results / Procedures / Treatments   Labs (all labs ordered are listed, but only abnormal results are displayed) Labs Reviewed  CBC - Abnormal; Notable for the following components:      Result Value   Hemoglobin 11.9 (*)    HCT 38.0 (*)    All other components within normal limits   COMPREHENSIVE METABOLIC PANEL - Abnormal; Notable for the following components:   Glucose, Bld 109 (*)    BUN 26 (*)    Calcium 8.7 (*)    All other components within normal limits  RAPID URINE DRUG SCREEN, HOSP PERFORMED - Abnormal; Notable for the following components:   Tetrahydrocannabinol POSITIVE (*)    All other components within normal limits  I-STAT CHEM 8, ED - Abnormal; Notable for the following components:   BUN 27 (*)    Glucose, Bld 104 (*)    Calcium, Ion 1.08 (*)    Hemoglobin 12.6 (*)    HCT 37.0 (*)    All other components within normal limits  SARS CORONAVIRUS 2 (TAT 6-24 HRS)  ETHANOL  PROTIME-INR  APTT  DIFFERENTIAL  URINALYSIS, ROUTINE W REFLEX MICROSCOPIC    EKG EKG Interpretation  Date/Time:  Sunday June 24 2019 15:32:23 EST Ventricular Rate:  71 PR Interval:    QRS Duration: 112 QT Interval:  389 QTC Calculation: 423 R Axis:   60 Text Interpretation: Sinus rhythm Incomplete right bundle branch block since last tracing no significant change Confirmed by Daleen Bo [phone]) on 06/24/2019 3:40:32 PM   Radiology CT HEAD WO CONTRAST  Result Date: 06/24/2019 CLINICAL DATA:  Speech difficulty EXAM: CT HEAD WITHOUT CONTRAST TECHNIQUE: Contiguous axial images were obtained from the base of the skull through the vertex without intravenous contrast. 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 No evidence of acute infarction, hemorrhage, hydrocephalus, extra-axial collection or mass lesion/mass effect. Patchy areas of white matter hypoattenuation are most compatible with chronic microvascular angiopathy. Vascular: Atherosclerotic calcification of the carotid siphons. No hyperdense vessel. Skull: No calvarial fracture or suspicious osseous lesion. No scalp swelling or hematoma. Sinuses/Orbits: Paranasal sinuses and mastoid air cells are predominantly clear.  Included orbital structures are unremarkable.  Distortion of the orbital structures noted at the margin of imaging is compatible with artifact. Other: None IMPRESSION: No CT evidence of acute intracranial pathology. 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 Electronically Signed   By: Lovena Le M.D.   On: 06/24/2019 16:04    Procedures Procedures (including critical care time)  Medications Ordered in ED Medications - No data to display  ED Course  I have reviewed the triage vital signs and the nursing notes.  Pertinent labs & imaging results that were available during my care of the patient were reviewed by me and considered in my medical decision making (see chart for details).  Clinical Course as of Jun 23 1945  Nancy Fetter Jun 24, 2019  1727 Normal  Ethanol [EW]  1728 Normal  Protime-INR [EW]  1728 Normal except hemoglobin low  CBC(!) [EW]  1728 Normal except BUN high, glucose high, calcium low, hemoglobin low  I-stat chem 8, ED(!) [EW]  1728 Normal except glucose high, BUN high, calcium low  Comprehensive metabolic panel(!) [EW]  [id number] Per radiologist interpretation, no acute abnormality.   [EW]  1808 Normal  Urinalysis, Routine w reflex microscopic [EW]    Clinical Course User Index [EW] Daleen Bo, MD   MDM Rules/Calculators/A&P                       Patient Vitals for the past 24 hrs:  BP Temp Temp src Pulse Resp SpO2 Height Weight  06/24/19 1745 127/81 -- -- 76 -- 92 % -- --  06/24/19 1735 (!) 143/86 -- -- 76 20 95 % -- --  06/24/19 1545 (!) 151/81 -- -- 77 17 96 % -- --  06/24/19 1534 (!) 149/89 98.2 F (36.8 C) Oral 77 17 96 % 6\' 2"  (1.88 m) 79.8 kg  06/24/19 1519 -- -- -- -- -- 95 % -- --    7:47 PM Reevaluation with update and discussion. After initial assessment and treatment, an updated evaluation reveals no change in clinical status, findings discussed with the patient, he is agreeable to hospitalization for evaluation of suspected TIA. Daleen Bo   7:45 PM-patient is  insistent on leaving.  He understands that the evaluation for TIA has not been completed.  He stated he would follow-up with his primary care doctor.  He will be discharged [place].  Medical Decision Making: Difficulty talking, possible aphasia, transient improved by arrival in the ED.  No evidence for large vessel occlusion.  Initial imaging negative.  Symptomatically improved in the ED.  Currently being treated for left cervical radiculopathy on narcotic medication.  No history of CVA.  [facility] was evaluated in Emergency Department on 06/24/2019 for the symptoms described in the history of present illness. He was evaluated in the context of the global COVID-19 pandemic, which necessitated consideration that the patient might be at risk for infection with the SARS-CoV-2 virus that causes COVID-19. Institutional protocols and algorithms that pertain to the evaluation of patients at risk for COVID-19 are in a state of rapid change based on information released by regulatory bodies including the CDC and federal and state organizations. These policies and algorithms were followed during the patient's care in the ED.  CRITICAL CARE-no Performed by: Daleen Bo   Nursing Notes Reviewed/ Care Coordinated Applicable Imaging Reviewed Interpretation of Laboratory Data incorporated into ED treatment  [place]   Final Clinical Impression(s) / ED Diagnoses Final diagnoses:  TIA (transient  ischemic attack)    Rx / DC Orders ED Discharge Orders    None       Daleen Bo, MD 06/24/19 [phone]

## 2019-06-24 NOTE — ED Triage Notes (Signed)
Pt arrived via [facility] from urgent care with complaints of difficulty speaking. Pt states he had trouble thinking of words and getting the correct answers out at doctors office. Symptoms have since subsided. Pt is alert and oriented x4. Pt's speech is currently clear.

## 2019-06-26 DIAGNOSIS — S61200A Unspecified open wound of right index finger without damage to nail, initial encounter: Secondary | ICD-10-CM | POA: Diagnosis not present

## 2019-06-28 DIAGNOSIS — S61200D Unspecified open wound of right index finger without damage to nail, subsequent encounter: Secondary | ICD-10-CM | POA: Diagnosis not present

## 2019-06-28 DIAGNOSIS — Z4800 Encounter for change or removal of nonsurgical wound dressing: Secondary | ICD-10-CM | POA: Diagnosis not present

## 2019-07-01 ENCOUNTER — Encounter (HOSPITAL_COMMUNITY): Payer: Self-pay | Admitting: Emergency Medicine

## 2019-07-01 ENCOUNTER — Emergency Department (HOSPITAL_COMMUNITY): Payer: Medicare Other

## 2019-07-01 ENCOUNTER — Other Ambulatory Visit: Payer: Self-pay

## 2019-07-01 ENCOUNTER — Emergency Department (HOSPITAL_COMMUNITY)
Admission: EM | Admit: 2019-07-01 | Discharge: 2019-07-01 | Disposition: A | Discharge status: Home / Self Care | Payer: Medicare Other | Attending: Emergency Medicine | Admitting: Emergency Medicine

## 2019-07-01 DIAGNOSIS — M7989 Other specified soft tissue disorders: Secondary | ICD-10-CM | POA: Diagnosis not present

## 2019-07-01 DIAGNOSIS — I10 Essential (primary) hypertension: Secondary | ICD-10-CM | POA: Diagnosis not present

## 2019-07-01 DIAGNOSIS — Z7984 Long term (current) use of oral hypoglycemic drugs: Secondary | ICD-10-CM | POA: Insufficient documentation

## 2019-07-01 DIAGNOSIS — Z86718 Personal history of other venous thrombosis and embolism: Secondary | ICD-10-CM | POA: Diagnosis not present

## 2019-07-01 DIAGNOSIS — Z79899 Other long term (current) drug therapy: Secondary | ICD-10-CM | POA: Diagnosis not present

## 2019-07-01 DIAGNOSIS — M79644 Pain in right finger(s): Secondary | ICD-10-CM

## 2019-07-01 DIAGNOSIS — E119 Type 2 diabetes mellitus without complications: Secondary | ICD-10-CM | POA: Insufficient documentation

## 2019-07-01 DIAGNOSIS — E038 Other specified hypothyroidism: Secondary | ICD-10-CM | POA: Insufficient documentation

## 2019-07-01 DIAGNOSIS — S61210A Laceration without foreign body of right index finger without damage to nail, initial encounter: Secondary | ICD-10-CM | POA: Diagnosis not present

## 2019-07-01 DIAGNOSIS — M79641 Pain in right hand: Secondary | ICD-10-CM | POA: Diagnosis not present

## 2019-07-01 DIAGNOSIS — Z87891 Personal history of nicotine dependence: Secondary | ICD-10-CM | POA: Insufficient documentation

## 2019-07-01 DIAGNOSIS — S61200A Unspecified open wound of right index finger without damage to nail, initial encounter: Secondary | ICD-10-CM | POA: Diagnosis not present

## 2019-07-01 MED ORDER — HYDROCODONE-ACETAMINOPHEN 5-325 MG PO TABS: 1.0000 | ORAL_TABLET | Freq: Four times a day (QID) | ORAL | 0 refills | Status: DC | PRN

## 2019-07-01 MED ORDER — OXYCODONE-ACETAMINOPHEN 5-325 MG PO TABS
1.0000 | ORAL_TABLET | Freq: Once | ORAL | Status: AC
  Administered 2019-07-01: 1 via ORAL
  Filled 2019-07-01: qty 1

## 2019-07-01 MED ORDER — ACETAMINOPHEN 500 MG PO TABS
1000.0000 mg | ORAL_TABLET | Freq: Once | ORAL | Status: DC
  Filled 2019-07-01: qty 2

## 2019-07-01 NOTE — ED Provider Notes (Signed)
[facility] DEPT Provider Note   CSN: [id number] Arrival date & time: 07/01/19  1443     History Chief Complaint  Patient presents with  . Hand Pain    Lee Mitchell is a 69 y.o. male who presents for evaluation of right index pain, swelling.  He reports that about 6 days ago, he was using a mandolin to slice a sweet potato and states that he cut the tip of his finger off.  He went to urgent care where they cauterized it to get the bleeding to stop.  He was discharged home and told to follow-up in 2 days.  He followed up in 2 days and states that there is no acute abnormalities.  The next day, he started having some worsening pain, swelling to the digit.  He states no new trauma, injury.  Since then, he felt like the pain has progressively worsened and the swelling has progressively worsened.  He called urgent care for advice and they advised him to go to the emergency department.  They did call him in a prescription of Keflex today which he has not taken.  He states that the finger hurts to move.  He states he has had chills but has not noted any fever.  He denies any numbness.  His tetanus is up-to-date.  The history is provided by the patient.       Past Medical History:  Diagnosis Date  . Chronic abdominal pain   . Colon polyps   . PNA (pneumonia)   . Thyroid cancer [facility])     Patient Active Problem List   Diagnosis Date Noted  . Thallium stress test abnormal 12/22/2018  . Tachycardia 12/14/2018  . DOE (dyspnea on exertion) 12/14/2018  . Abnormal electrocardiogram (ECG) (EKG) 12/14/2018  . Abnormal electrocardiogram 12/14/2018  . Deep vein thrombosis (DVT) of calf muscle vein of left lower extremity ([place]) 12/14/2018  . Encounter for screening for HIV 07/12/2018  . Drug-seeking behavior 05/21/2018  . Opiates and related narcotics causing adverse effect in therapeutic use, initial encounter 05/21/2018  . Left rotator cuff tear 05/18/2018  .  Cannabis abuse 04/29/2018  . 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 Type II diabetes mellitus with manifestations ([place]) 04/26/2018  . Hyperlipidemia LDL goal <100 04/26/2018  . Routine general medical examination at a health care facility 04/26/2018  . 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 PSA elevation 04/26/2018  . Hypertension 04/26/2018  . Benign prostatic hyperplasia without lower urinary tract symptoms 04/26/2018  . Encounter for long-term opiate analgesic use 04/26/2018  . Colon cancer screening 04/26/2018  . Centrilobular emphysema ([place]) 04/03/2018  . MDD (major depressive disorder), recurrent episode, severe ([place]) 02/15/2017  . Chronic pain syndrome 02/15/2017  . GERD 03/22/2010    Past Surgical History:  Procedure Laterality Date  . ANKLE ARTHROSCOPY WITH RECONSTRUCTION    . CHOLECYSTECTOMY    . COLONOSCOPY    . ELBOW ARTHROSCOPY WITH TENDON RECONSTRUCTION    . SHOULDER ARTHROSCOPY WITH LABRAL REPAIR    . THYROIDECTOMY     2004/2005  . ULNAR NERVE TRANSPOSITION         Family History  Problem Relation Age of Onset  . Kidney cancer Father   . Hypertension Father   . Hypertension Mother   . Hyperthyroidism Mother   . Cancer Mother 82       thyroid cancer  . Thyroid cancer Maternal Uncle     Social History   Tobacco Use  .  Smoking status: Former Smoker    Packs/day: 0.25    Years: 35.00    Pack years: 8.75    Types: Cigarettes  . Smokeless tobacco: Never Used  Substance Use Topics  . Alcohol use: Not Currently    Alcohol/week: 0.0 standard drinks  . Drug use: Yes    Types: Marijuana    Home Medications Prior to Admission medications   Medication Sig Start Date End Date Taking? Authorizing Provider  acetaminophen (TYLENOL) 500 MG tablet Take 500 mg by mouth every 6 (six) hours as needed for mild pain, moderate pain or headache.    [provider]  albuterol (VENTOLIN HFA) 108 (90 Base) MCG/ACT inhaler Inhale 2  puffs into the lungs every 6 (six) hours as needed for wheezing or shortness of breath. 06/13/19 07/13/19  [provider]  Artificial Tear Ointment (DRY EYES OP) Apply 1 drop to eye as needed (dry eyes).    [provider]  diclofenac Sodium (VOLTAREN) 1 % GEL Apply 2 g topically 4 (four) times daily. 06/16/19   Long, Wonda Olds, MD  HYDROcodone-acetaminophen (NORCO/VICODIN) 5-325 MG tablet Take 1-2 tablets by mouth every 6 (six) hours as needed. 07/01/19   Volanda Napoleon, PA-C  ibuprofen (ADVIL) 200 MG tablet Take 200 mg by mouth every 6 (six) hours as needed for headache or mild pain.    [provider]  levothyroxine (SYNTHROID) 175 MCG tablet TAKE 1 TABLET (175 MCG TOTAL) BY MOUTH DAILY BEFORE BREAKFAST. 04/07/19   [name], MD  metFORMIN (GLUCOPHAGE) 500 MG tablet Take 500 mg by mouth daily with breakfast. 04/04/19   [provider]  methocarbamol (ROBAXIN) 750 MG tablet Take 750 mg by mouth 3 (three) times daily as needed for muscle spasms.    [provider]  mirtazapine (REMERON SOL-TAB) 30 MG disintegrating tablet DISSOLVE 1 TABLET UNDER TONGUE AT BEDTIME Patient taking differently: Take 30 mg by mouth at bedtime. Dissolve 1 tablet (30mg ) under tongue at bedtime 04/22/19   [name], MD  omeprazole (PRILOSEC) 40 MG capsule TAKE 1 CAPSULE BY MOUTH TWICE A DAY Patient taking differently: Take 40 mg by mouth in the morning and at bedtime.  02/06/19   [name], MD  oxyCODONE-acetaminophen (PERCOCET/ROXICET) 5-325 MG tablet Take 1 tablet by mouth every 6 (six) hours as needed for severe pain. 06/16/19   Long, Wonda Olds, MD  Pitavastatin Calcium (LIVALO) 4 MG TABS Take 1 tablet (4 mg total) by mouth daily. 12/14/18   [name], MD  rosuvastatin (CRESTOR) 40 MG tablet Take 40 mg by mouth at bedtime.  02/22/19   [provider]  umeclidinium-vilanterol (ANORO ELLIPTA) 62.5-25 MCG/INH AEPB Inhale 1 puff into the lungs daily.  04/03/18   Rigoberto Noel, MD    Allergies    Patient has no known allergies.  Review of Systems   Review of Systems  Constitutional: Positive for chills. Negative for fever.  Musculoskeletal:       Right index finger swelling and pain  Skin: Positive for color change.  Neurological: Negative for weakness and numbness.  All other systems reviewed and are negative.   Physical Exam Updated Vital Signs BP (!) 151/84 (BP Location: Left Arm)   Pulse 96   Temp 98 F (36.7 C) (Oral)   Resp 16   SpO2 99%   Physical Exam Vitals reviewed.  Constitutional:      Appearance: He is well-developed.  HENT:     Head: Normocephalic and  atraumatic.  Eyes:     General: No scleral icterus.       Right eye: No discharge.        Left eye: No discharge.     Conjunctiva/sclera: Conjunctivae normal.  Cardiovascular:     Pulses:          Radial pulses are 2+ on the right side and 2+ on the left side.  Pulmonary:     Effort: Pulmonary effort is normal.  Musculoskeletal:     Comments: Tenderness palpation noted to the flexor tendon of the right index finger.  He is holding the right index finger in a slightly flexed position.  He has some extension without pain but has significant pain with trying to flex the finger.  He has some mild diffuse swelling noted to the finger but no overlying warmth, erythema.  No palpable fluctuance.  Skin:    General: Skin is warm and dry.     Capillary Refill: Capillary refill takes less than 2 seconds.     Comments: Distal tip of the right index finger has a events of previous cautery and is black at the tip.  Surrounding area has good distal cap refill.  Neurological:     Mental Status: He is alert.     Comments: Sensation intact along major nerve distributions of RUE  Psychiatric:        Speech: Speech normal.        Behavior: Behavior normal.             ED Results / Procedures / Treatments   Labs (all labs ordered are listed, but only abnormal  results are displayed) Labs Reviewed - No data to display  EKG None  Radiology DG Finger Index Right  Result Date: 07/01/2019 CLINICAL DATA:  Patient suffered a laceration at the tip of the index finger 1 week ago. Pain and swelling. Initial encounter. EXAM: RIGHT INDEX FINGER 2+V COMPARISON:  None. FINDINGS: Skin wound is seen at the tip of the index finger. There appears to be bandaging or medicine on the wound. No radiopaque foreign body in the soft tissues or soft tissue gas identified. Soft tissues of the index finger appear somewhat swollen. No bony or joint abnormality. IMPRESSION: Soft tissue swelling of the index finger with a skin wound identified. Negative for osteomyelitis or other acute abnormality. Electronically Signed   By: Inge Rise M.D.   On: 07/01/2019 17:07    Procedures Procedures (including critical care time)  Medications Ordered in ED Medications  oxyCODONE-acetaminophen (PERCOCET/ROXICET) 5-325 MG per tablet 1 tablet (1 tablet Oral Given 07/01/19 1655)    ED Course  I have reviewed the triage vital signs and the nursing notes.  Pertinent labs & imaging results that were available during my care of the patient were reviewed by me and considered in my medical decision making (see chart for details).    MDM Rules/Calculators/A&P                      69 year old male who presents for evaluation of pain to his right index finger.  He reports that about a week ago, he injured it while using a mandolin.  Urgent care cauterized there.  Followed up with urgent care 2 days later exam was unremarkable.  Since then, he has had pain, swelling to the finger.  Called urgent care today and they advised him to come to the ED.  They did prescribe him Keflex which she has not  picked up yet.  On initially arrival, he is afebrile, nontoxic-appearing. Vital signs reviewed and stable. Patient is neurovascularly intact.  On exam, the distal tip of his right index finger is  cauterized.  He has tenderness tendon sheath.  There are some mild soft tissue swelling not significantly warm, erythematous.  Question if this is early infection versus residual soft tissue swelling from injury.  Plan for x-ray to ensure there is no abnormality noted of the distal phalanx.  X-rays reviewed.  Negative for any acute bony ability.  Discussed patient with Dr. Claudia Desanctis (hand) who evaluated the images in his note.  He recommends patient taking the Keflex and will plan to follow-up with him in the office.  Updated patient on results.  Will give short course of pain medication for acute pain. At this time, patient exhibits no emergent life-threatening condition that require further evaluation in ED or admission. Patient had ample opportunity for questions and discussion. All patient's questions were answered with full understanding. Strict return precautions discussed. Patient expresses understanding and agreement to plan.   Portions of this note were generated with [employer]. Dictation errors may occur despite best attempts at proofreading.  Final Clinical Impression(s) / ED Diagnoses Final diagnoses:  Pain of finger of right hand    Rx / DC Orders ED Discharge Orders         Ordered    HYDROcodone-acetaminophen (NORCO/VICODIN) 5-325 MG tablet  Every 6 hours PRN     07/01/19 1734           Desma Mcgregor 07/01/19 2307    Quintella Reichert, MD 07/04/19 0730

## 2019-07-01 NOTE — ED Triage Notes (Signed)
Per pt, states he cut tip of right index last week-was seen at [facility] again today due to pain and swelling-they sent him here for possible infection

## 2019-07-01 NOTE — Discharge Instructions (Signed)
You can take Tylenol or Ibuprofen as directed for pain. You can alternate Tylenol and Ibuprofen every 4 hours. If you take Tylenol at 1pm, then you can take Ibuprofen at 5pm. Then you can take Tylenol again at 9pm.   Take pain medications as directed for break through pain. Do not drive or operate machinery while taking this medication.   Take antibiotics as directed. Please take all of your antibiotics until finished.  Follow up with Dr. Claudia Desanctis (hand doctor). Call his office and arrange for an appointment.   Return to the Emergency Dept for any worsening pain, fever, redness/swelling that begins to spread or any other worsening or concerning symptoms.

## 2019-07-04 ENCOUNTER — Institutional Professional Consult (permissible substitution): Payer: Medicare Other | Admitting: Plastic Surgery

## 2019-07-05 ENCOUNTER — Other Ambulatory Visit: Payer: Self-pay

## 2019-07-05 ENCOUNTER — Ambulatory Visit (INDEPENDENT_AMBULATORY_CARE_PROVIDER_SITE_OTHER): Payer: Medicare Other | Admitting: Plastic Surgery

## 2019-07-05 ENCOUNTER — Encounter: Payer: Self-pay | Admitting: Plastic Surgery

## 2019-07-05 VITALS — BP 108/71 | HR 101 | Ht 74.0 in | Wt 172.0 lb

## 2019-07-05 DIAGNOSIS — S6991XA Unspecified injury of right wrist, hand and finger(s), initial encounter: Secondary | ICD-10-CM

## 2019-07-05 MED ORDER — HYDROCODONE-ACETAMINOPHEN 5-325 MG PO TABS: 1.0000 | ORAL_TABLET | Freq: Four times a day (QID) | ORAL | 0 refills | Status: DC | PRN

## 2019-07-05 NOTE — Progress Notes (Signed)
Referring Provider Patrecia Pour, Christean Grief, MD [address],  [place] [address]   CC:  Chief Complaint  Patient presents with  . Advice Only    ED visit 07/01/19 injury to finger tip      Lee Mitchell is an 69 y.o. male.  HPI: Patient presents to discuss injury to his right index fingertip.  He was initially peeling some vegetables and cut off the very tip of his right index finger.  This was initially treated with wound care however it started to bleed and was cauterized at an urgent care facility with silver nitrate.  Since then he has had quite a bit of pain that he describes as tingling pain that is out of proportion to the appearance of the finger.  He was seen in the emergency room and sent to me.  He says things are really unchanged since then he does not have any fevers and chills.  No Known Allergies  Outpatient Encounter Medications as of 07/05/2019  Medication Sig  . acetaminophen (TYLENOL) 500 MG tablet Take 500 mg by mouth every 6 (six) hours as needed for mild pain, moderate pain or headache.  . albuterol (VENTOLIN HFA) 108 (90 Base) MCG/ACT inhaler Inhale 2 puffs into the lungs every 6 (six) hours as needed for wheezing or shortness of breath.  . Artificial Tear Ointment (DRY EYES OP) Apply 1 drop to eye as needed (dry eyes).  Marland Kitchen diclofenac Sodium (VOLTAREN) 1 % GEL Apply 2 g topically 4 (four) times daily.  Marland Kitchen HYDROcodone-acetaminophen (NORCO/VICODIN) 5-325 MG tablet Take 1-2 tablets by mouth every 6 (six) hours as needed.  Marland Kitchen ibuprofen (ADVIL) 200 MG tablet Take 200 mg by mouth every 6 (six) hours as needed for headache or mild pain.  Marland Kitchen levothyroxine (SYNTHROID) 175 MCG tablet TAKE 1 TABLET (175 MCG TOTAL) BY MOUTH DAILY BEFORE BREAKFAST.  . metFORMIN (GLUCOPHAGE) 500 MG tablet Take 500 mg by mouth daily with breakfast.  . methocarbamol (ROBAXIN) 750 MG tablet Take 750 mg by mouth 3 (three) times daily as needed for muscle spasms.  . mirtazapine (REMERON  SOL-TAB) 30 MG disintegrating tablet DISSOLVE 1 TABLET UNDER TONGUE AT BEDTIME (Patient taking differently: Take 30 mg by mouth at bedtime. Dissolve 1 tablet (30mg ) under tongue at bedtime)  . omeprazole (PRILOSEC) 40 MG capsule TAKE 1 CAPSULE BY MOUTH TWICE A DAY (Patient taking differently: Take 40 mg by mouth in the morning and at bedtime. )  . oxyCODONE-acetaminophen (PERCOCET/ROXICET) 5-325 MG tablet Take 1 tablet by mouth every 6 (six) hours as needed for severe pain.  . Pitavastatin Calcium (LIVALO) 4 MG TABS Take 1 tablet (4 mg total) by mouth daily.  . rosuvastatin (CRESTOR) 40 MG tablet Take 40 mg by mouth at bedtime.   Marland Kitchen umeclidinium-vilanterol (ANORO ELLIPTA) 62.5-25 MCG/INH AEPB Inhale 1 puff into the lungs daily.   No facility-administered encounter medications on file as of 07/05/2019.     Past Medical History:  Diagnosis Date  . Chronic abdominal pain   . Colon polyps   . PNA (pneumonia)   . Thyroid cancer [facility])     Past Surgical History:  Procedure Laterality Date  . ANKLE ARTHROSCOPY WITH RECONSTRUCTION    . CHOLECYSTECTOMY    . COLONOSCOPY    . ELBOW ARTHROSCOPY WITH TENDON RECONSTRUCTION    . SHOULDER ARTHROSCOPY WITH LABRAL REPAIR    . THYROIDECTOMY     2004/2005  . ULNAR NERVE TRANSPOSITION  Family History  Problem Relation Age of Onset  . Kidney cancer Father   . Hypertension Father   . Hypertension Mother   . Hyperthyroidism Mother   . Cancer Mother 63       thyroid cancer  . Thyroid cancer Maternal Uncle     Social History   Social History Narrative  . Not on file     Review of Systems General: Denies fevers, chills, weight loss CV: Denies chest pain, shortness of breath, palpitations  Physical Exam Vitals with BMI 07/05/2019 07/01/2019 07/01/2019  Height 6\' 2"  - -  Weight 172 lbs - -  BMI [id number] - -  Systolic 123XX123 [id number] [id number]  Diastolic 71 84 85  Pulse [id number] 96 102  Some encounter information is confidential and restricted. Go to Review  Flowsheets activity to see all data.    General:  No acute distress,  Alert and oriented, Non-Toxic, Normal speech and affect Right hand: Fingers are well perfused with normal cap refill palp radial pulse.  Sensation is intact throughout.  He has good range of motion but some stiffness in the index finger due to pain.  He is got some small amount of swelling and very mild erythema around the fingertip wound.  The wound is about half a centimeter in size and looks like a oblique amputation of the very tip as it abuts the nail plate.  I do not detect any fluctuance but he is very tender around that area.  He is nontender over the flexor tendon sheath proximally.  X-ray was reviewed and shows no bony injury.  Assessment/Plan Patient presents with pain after sharp injury to the distal aspect of his right index finger.  I wonder if his nerve related pain may be due to the size silver nitrate cauterization.  Either way I do not see a surgical indication for him at this point.  I told him to watch out for any signs of infection with increasing swelling erythema and pain.  I will plan to see him next week to make sure that this is improving.  I told him I could give him 1 refill of his pain medication but would not be able to give him any more after that.  Cindra Presume 07/05/2019, 2:56 PM

## 2019-07-06 ENCOUNTER — Telehealth: Payer: Self-pay

## 2019-07-06 ENCOUNTER — Encounter: Payer: Self-pay | Admitting: Plastic Surgery

## 2019-07-06 ENCOUNTER — Other Ambulatory Visit: Payer: Self-pay | Admitting: Surgical

## 2019-07-06 DIAGNOSIS — R6889 Other general symptoms and signs: Secondary | ICD-10-CM | POA: Diagnosis not present

## 2019-07-06 DIAGNOSIS — M961 Postlaminectomy syndrome, not elsewhere classified: Secondary | ICD-10-CM | POA: Diagnosis not present

## 2019-07-06 DIAGNOSIS — M7062 Trochanteric bursitis, left hip: Secondary | ICD-10-CM | POA: Diagnosis not present

## 2019-07-06 DIAGNOSIS — G8929 Other chronic pain: Secondary | ICD-10-CM | POA: Diagnosis not present

## 2019-07-06 DIAGNOSIS — G894 Chronic pain syndrome: Secondary | ICD-10-CM | POA: Diagnosis not present

## 2019-07-06 DIAGNOSIS — M47812 Spondylosis without myelopathy or radiculopathy, cervical region: Secondary | ICD-10-CM | POA: Diagnosis not present

## 2019-07-06 MED ORDER — HYDROCODONE-ACETAMINOPHEN 5-325 MG PO TABS: 1.0000 | ORAL_TABLET | Freq: Three times a day (TID) | ORAL | 0 refills | Status: AC | PRN

## 2019-07-06 NOTE — Telephone Encounter (Signed)
Matt called patient back, advised he resent prescription in.

## 2019-07-06 NOTE — Progress Notes (Signed)
Patient seen by Dr. Claudia Desanctis on 07/05/2019 for injury to right index finger.  Patient's prescription for pain medication sent in by Dr. Claudia Desanctis was incidentally printed versus electronic will be sent to pharmacy.  I have resent prescription to pharmacy.  Confirmed with pharmacy that no prescription was filled by patient from Dr. Claudia Desanctis.

## 2019-07-06 NOTE — Telephone Encounter (Signed)
Patient called to follow up on pain medication refill that was dicussed during his visit yesterday as he has not heard from his pharmacy yet.

## 2019-07-12 ENCOUNTER — Ambulatory Visit: Payer: Medicare Other | Admitting: Plastic Surgery

## 2019-07-12 ENCOUNTER — Other Ambulatory Visit: Payer: Self-pay

## 2019-07-12 ENCOUNTER — Encounter: Payer: Self-pay | Admitting: Plastic Surgery

## 2019-07-12 VITALS — BP 142/75 | HR 106 | Temp 97.5°F | Ht 74.0 in | Wt 167.8 lb

## 2019-07-12 DIAGNOSIS — S6991XA Unspecified injury of right wrist, hand and finger(s), initial encounter: Secondary | ICD-10-CM

## 2019-07-12 NOTE — Progress Notes (Signed)
   Referring Provider Patrecia Pour, Christean Grief, MD [address] [place],  [place] [address]   CC:  Chief Complaint  Patient presents with  . Follow-up    injury of finger of the right hand      Lee Mitchell is an 69 y.o. male.  HPI: Patient presents for follow-up of his finger injury.  He had a partial amputation of the pulp of the fingertip which was cauterized with silver nitrate.  He had a lot of pain initially and I gave him some pain medication a week ago.  Since then he is done much better and feels like things are going well.  He still has a little bit of pain but the tingling sensitivity is much improved.  He has not had any fevers or chills  Review of Systems General: Denies fevers or chills  Physical Exam Vitals with BMI 07/12/2019 07/05/2019 07/01/2019  Height 6\' 2"  6\' 2"  -  Weight 167 lbs 13 oz 172 lbs -  BMI [id number] -  Systolic A999333 [id number]  Diastolic 75 71 84  Pulse A999333 101 96  Some encounter information is confidential and restricted. Go to Review Flowsheets activity to see all data.    General:  No acute distress,  Alert and oriented, Non-Toxic, Normal speech and affect On exam the finger looks about the same.  There is likely less swelling and erythema than he had previously.  The eschar still over the tip where the silver nitrate was placed.  He has good range of motion and minimal discomfort.  There is no tenderness over the flexor tendon sheath.  Assessment/Plan Patient doing well after an injury to his fingertip.  The pain and swelling and range of motion are all much better.  I think he will carry on and define.  I have asked him to follow-up on an as-needed basis and he agrees with that plan.  Cindra Presume 07/12/2019, 1:53 PM

## 2019-07-13 DIAGNOSIS — R112 Nausea with vomiting, unspecified: Secondary | ICD-10-CM | POA: Diagnosis not present

## 2019-07-13 DIAGNOSIS — R1031 Right lower quadrant pain: Secondary | ICD-10-CM | POA: Diagnosis not present

## 2019-07-13 DIAGNOSIS — R6889 Other general symptoms and signs: Secondary | ICD-10-CM | POA: Diagnosis not present

## 2019-07-13 DIAGNOSIS — M542 Cervicalgia: Secondary | ICD-10-CM | POA: Diagnosis not present

## 2019-07-13 DIAGNOSIS — R197 Diarrhea, unspecified: Secondary | ICD-10-CM | POA: Diagnosis not present

## 2019-07-13 DIAGNOSIS — R000 Tachycardia, unspecified: Secondary | ICD-10-CM | POA: Diagnosis not present

## 2019-07-13 DIAGNOSIS — R0602 Shortness of breath: Secondary | ICD-10-CM | POA: Diagnosis not present

## 2019-07-13 DIAGNOSIS — I4510 Unspecified right bundle-branch block: Secondary | ICD-10-CM | POA: Diagnosis not present

## 2019-07-13 DIAGNOSIS — R55 Syncope and collapse: Secondary | ICD-10-CM | POA: Diagnosis not present

## 2019-07-13 DIAGNOSIS — J432 Centrilobular emphysema: Secondary | ICD-10-CM | POA: Diagnosis not present

## 2019-07-15 ENCOUNTER — Other Ambulatory Visit: Payer: Self-pay | Admitting: Internal Medicine

## 2019-07-15 DIAGNOSIS — F3341 Major depressive disorder, recurrent, in partial remission: Secondary | ICD-10-CM

## 2019-09-04 ENCOUNTER — Telehealth: Payer: Self-pay

## 2019-09-04 NOTE — Telephone Encounter (Signed)
I called. Discussed. OK to cancel his appt. He is going to see if he can see Dr. Rennis Harding in same practice as his surgeon .

## 2019-09-04 NOTE — Telephone Encounter (Signed)
noted 

## 2019-09-04 NOTE — Telephone Encounter (Signed)
Patient would like a call back from Dr. Lorin Mercy.  CB# [phone].  Please advise.  Thank you.

## 2019-09-04 NOTE — Telephone Encounter (Signed)
Please see below and advise.

## 2019-09-12 ENCOUNTER — Ambulatory Visit: Payer: Medicare Other | Admitting: Orthopaedic Surgery

## 2019-09-13 ENCOUNTER — Other Ambulatory Visit: Payer: Self-pay

## 2019-09-13 ENCOUNTER — Emergency Department (HOSPITAL_COMMUNITY)
Admission: EM | Admit: 2019-09-13 | Discharge: 2019-09-13 | Discharge status: Against Medical Advice | Payer: Medicare Other

## 2019-09-14 ENCOUNTER — Other Ambulatory Visit: Payer: Self-pay

## 2019-09-14 ENCOUNTER — Emergency Department (HOSPITAL_BASED_OUTPATIENT_CLINIC_OR_DEPARTMENT_OTHER)
Admission: EM | Admit: 2019-09-14 | Discharge: 2019-09-14 | Disposition: A | Discharge status: Home / Self Care | Payer: Medicare Other | Attending: Emergency Medicine | Admitting: Emergency Medicine

## 2019-09-14 ENCOUNTER — Emergency Department (HOSPITAL_COMMUNITY): Payer: Medicare Other

## 2019-09-14 ENCOUNTER — Encounter (HOSPITAL_BASED_OUTPATIENT_CLINIC_OR_DEPARTMENT_OTHER): Payer: Self-pay | Admitting: Emergency Medicine

## 2019-09-14 DIAGNOSIS — Z85850 Personal history of malignant neoplasm of thyroid: Secondary | ICD-10-CM | POA: Diagnosis not present

## 2019-09-14 DIAGNOSIS — Z7982 Long term (current) use of aspirin: Secondary | ICD-10-CM | POA: Insufficient documentation

## 2019-09-14 DIAGNOSIS — R6883 Chills (without fever): Secondary | ICD-10-CM | POA: Diagnosis not present

## 2019-09-14 DIAGNOSIS — M5441 Lumbago with sciatica, right side: Secondary | ICD-10-CM | POA: Diagnosis not present

## 2019-09-14 DIAGNOSIS — Z87891 Personal history of nicotine dependence: Secondary | ICD-10-CM | POA: Insufficient documentation

## 2019-09-14 DIAGNOSIS — Z79899 Other long term (current) drug therapy: Secondary | ICD-10-CM | POA: Diagnosis not present

## 2019-09-14 DIAGNOSIS — I10 Essential (primary) hypertension: Secondary | ICD-10-CM | POA: Insufficient documentation

## 2019-09-14 DIAGNOSIS — E119 Type 2 diabetes mellitus without complications: Secondary | ICD-10-CM | POA: Diagnosis not present

## 2019-09-14 DIAGNOSIS — M545 Low back pain: Secondary | ICD-10-CM | POA: Diagnosis present

## 2019-09-14 DIAGNOSIS — M5442 Lumbago with sciatica, left side: Secondary | ICD-10-CM | POA: Diagnosis not present

## 2019-09-14 LAB — COMPREHENSIVE METABOLIC PANEL
ALT: 11 U/L (ref 0–44)
AST: 13 U/L — ABNORMAL LOW (ref 15–41)
Albumin: 3.8 g/dL (ref 3.5–5.0)
Alkaline Phosphatase: 90 U/L (ref 38–126)
Anion gap: 12 (ref 5–15)
BUN: 20 mg/dL (ref 8–23)
CO2: 25 mmol/L (ref 22–32)
Calcium: 8.8 mg/dL — ABNORMAL LOW (ref 8.9–10.3)
Chloride: 103 mmol/L (ref 98–111)
Creatinine, Ser: 0.89 mg/dL (ref 0.61–1.24)
GFR calc Af Amer: >60 mL/min (ref >60)
GFR calc non Af Amer: >60 mL/min (ref >60)
Glucose, Bld: 132 mg/dL — ABNORMAL HIGH (ref 70–99)
Potassium: 4.3 mmol/L (ref 3.5–5.1)
Sodium: 140 mmol/L (ref 135–145)
Total Bilirubin: 0.4 mg/dL (ref 0.3–1.2)
Total Protein: 7.4 g/dL (ref 6.5–8.1)

## 2019-09-14 LAB — CBC WITH DIFFERENTIAL/PLATELET
Abs Immature Granulocytes: 0.03 10*3/uL (ref 0.00–0.07)
Basophils Absolute: 0.1 10*3/uL (ref 0.0–0.1)
Basophils Relative: 1 %
Eosinophils Absolute: 0.1 10*3/uL (ref 0.0–0.5)
Eosinophils Relative: 1 %
HCT: 39.2 % (ref 39.0–52.0)
Hemoglobin: 13 g/dL (ref 13.0–17.0)
Immature Granulocytes: 0 %
Lymphocytes Relative: 20 %
Lymphs Abs: 2.2 10*3/uL (ref 0.7–4.0)
MCH: 27.2 pg (ref 26.0–34.0)
MCHC: 33.2 g/dL (ref 30.0–36.0)
MCV: 82 fL (ref 80.0–100.0)
Monocytes Absolute: 0.7 10*3/uL (ref 0.1–1.0)
Monocytes Relative: 7 %
Neutro Abs: 7.7 10*3/uL (ref 1.7–7.7)
Neutrophils Relative %: 71 %
Platelets: 407 10*3/uL — ABNORMAL HIGH (ref 150–400)
RBC: 4.78 MIL/uL (ref 4.22–5.81)
RDW: 14.9 % (ref 11.5–15.5)
WBC: 10.8 10*3/uL — ABNORMAL HIGH (ref 4.0–10.5)
nRBC: 0 % (ref 0.0–0.2)

## 2019-09-14 LAB — URINALYSIS, ROUTINE W REFLEX MICROSCOPIC
Bilirubin Urine: NEGATIVE
Glucose, UA: NEGATIVE mg/dL
Hgb urine dipstick: NEGATIVE
Ketones, ur: 15 mg/dL — AB
Leukocytes,Ua: NEGATIVE
Nitrite: NEGATIVE
Protein, ur: NEGATIVE mg/dL
Specific Gravity, Urine: >1.03 — ABNORMAL HIGH (ref 1.005–1.030)
pH: 6 (ref 5.0–8.0)

## 2019-09-14 MED ORDER — HYDROCODONE-ACETAMINOPHEN 5-325 MG PO TABS: 2.0000 | ORAL_TABLET | Freq: Four times a day (QID) | ORAL | 0 refills | Status: DC | PRN

## 2019-09-14 MED ORDER — OXYCODONE-ACETAMINOPHEN 5-325 MG PO TABS
1.0000 | ORAL_TABLET | Freq: Once | ORAL | Status: AC
  Administered 2019-09-14: 1 via ORAL
  Filled 2019-09-14: qty 1

## 2019-09-14 MED ORDER — OXYCODONE-ACETAMINOPHEN 5-325 MG PO TABS: 1.0000 | ORAL_TABLET | Freq: Once | ORAL | Status: DC

## 2019-09-14 MED ORDER — MORPHINE SULFATE (PF) 4 MG/ML IV SOLN: 4.0000 mg | Freq: Once | INTRAVENOUS | Status: DC

## 2019-09-14 MED ORDER — GADOBUTROL 1 MMOL/ML IV SOLN
7.5000 mL | Freq: Once | INTRAVENOUS | Status: AC | PRN
  Administered 2019-09-14: 7.5 mL via INTRAVENOUS

## 2019-09-14 NOTE — ED Notes (Signed)
Pt arrives from [facility] for MRI of back. reports approx 10 days ago he started having sharp lower back pain that radiated down his R leg. He reports that pain is in central lower back and has progressed, now radiating down both legs. Denies loss of bowel or bladder, numbness or tingling to groin area.

## 2019-09-14 NOTE — ED Notes (Signed)
MRI contacted this RN and advised they are aware patient is here and they will get him over as soon as they can.

## 2019-09-14 NOTE — ED Provider Notes (Signed)
Patient seen as a transfer from [facility] with lower back pain with bilateral sciatica.  He has some sensory deficits in his lower legs bilaterally.  He tells me that this is chronic but it comes and goes.  He has a history of chronic back pain with prior back surgery.  MRI was performed which shows no acute abnormality.  No evidence of abscess/infection.  No evidence of cauda equina.  He is requesting a short course of some pain medication.  On chart review I did note that he was previously in a pain clinic and was getting buprenorphine.  He said they stop using these about a week ago and advised the pain clinic that he was not can to be able to be seen there anymore due to the difficulty traveling to [facility].  I did advise him that if he is clinic continue with the pain clinic that they may discharge him if they note that he has broken his pain contract by getting opioids at other sites.  He is aware of this and says that he is no longer going to the pain clinic and request a short course of pain medication.  I gave him 12 tablets of Vicodin but I did advise him that he needs to follow-up with his spine surgeon.  He notes that he called his spine surgeon earlier today and is going to make an appointment.  Return precautions were given.   Malvin Johns, MD 09/14/19 1739

## 2019-09-14 NOTE — ED Triage Notes (Signed)
Pt reports lower back pain starting 10 days ago that radiates down to mid thigh bilaterally; pt tried ibuprofen with no relief; pt reports hx of back problems/surgeries.

## 2019-09-14 NOTE — ED Notes (Signed)
PA Morrelli in to assess and place any appropriate orders.

## 2019-09-14 NOTE — ED Provider Notes (Signed)
[place] EMERGENCY DEPARTMENT Provider Note   CSN: [id number] Arrival date & time: 09/14/19  [id number]     History Chief Complaint  Patient presents with  . Back Pain    Lee Mitchell is a 69 y.o. male.  The history is provided by the patient and medical records. No language interpreter was used.  Back Pain  Lee Mitchell is a 69 y.o. male who presents to the Emergency Department complaining of back pain.  He presents to the ED complaining of low back pain which started about 10 days ago.   Pain is located in the low back (bilaterally).  Pain began at rest and radiates down both legs.  It feels like rebar is being pounded down his legs and backside. Pain is constant in nature.  It is slightly improved since it began 10 days ago.  Pain is worse with movement.  Pain radiates to the knee posteriorly bilaterally.  On the RLE it radiates to the back of his right ankle.  He has a hx/o lumbar surgery in November and this pain feels similar.     Denies fever.  Has noticed a temperature to 100.4 at home.  Has occasional chills and hot flashes.  Has poor appetite and nausea.  Has chronic cough due to COPD - unchanged from baseline.  No loss of taste/smell.  Has nausea, no vomiting.  Has numbness to right great toe.  Taking ibuprofen for pain.  Has been on a patch for pain - quite using one week ago because it was not working.  Has associated weak urine stream and dark urine.    Has hx/o thyroid cancer - in remission, diabetes.      Past Medical History:  Diagnosis Date  . Chronic abdominal pain   . Colon polyps   . PNA (pneumonia)   . Thyroid cancer [facility])     Patient Active Problem List   Diagnosis Date Noted  . Thallium stress test abnormal 12/22/2018  . Tachycardia 12/14/2018  . DOE (dyspnea on exertion) 12/14/2018  . Abnormal electrocardiogram (ECG) (EKG) 12/14/2018  . Abnormal electrocardiogram 12/14/2018  . Deep vein thrombosis (DVT) of calf muscle vein of left  lower extremity ([place]) 12/14/2018  . Encounter for screening for HIV 07/12/2018  . Drug-seeking behavior 05/21/2018  . Opiates and related narcotics causing adverse effect in therapeutic use, initial encounter 05/21/2018  . Left rotator cuff tear 05/18/2018  . Cannabis abuse 04/29/2018  . 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 Type II diabetes mellitus with manifestations ([place]) 04/26/2018  . Hyperlipidemia LDL goal <100 04/26/2018  . Routine general medical examination at a health care facility 04/26/2018  . 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 PSA elevation 04/26/2018  . Hypertension 04/26/2018  . Benign prostatic hyperplasia without lower urinary tract symptoms 04/26/2018  . Encounter for long-term opiate analgesic use 04/26/2018  . Colon cancer screening 04/26/2018  . Centrilobular emphysema ([place]) 04/03/2018  . MDD (major depressive disorder), recurrent episode, severe ([place]) 02/15/2017  . Chronic pain syndrome 02/15/2017  . GERD 03/22/2010    Past Surgical History:  Procedure Laterality Date  . ANKLE ARTHROSCOPY WITH RECONSTRUCTION    . BACK SURGERY    . CHOLECYSTECTOMY    . COLONOSCOPY    . ELBOW ARTHROSCOPY WITH TENDON RECONSTRUCTION    . SHOULDER ARTHROSCOPY WITH LABRAL REPAIR    . THYROIDECTOMY     2004/2005  . ULNAR NERVE TRANSPOSITION         Family History  Problem Relation Age of Onset  . Kidney cancer Father   . Hypertension Father   . Hypertension Mother   . Hyperthyroidism Mother   . Cancer Mother 38       thyroid cancer  . Thyroid cancer Maternal Uncle     Social History   Tobacco Use  . Smoking status: Former Smoker    Packs/day: 0.25    Years: 35.00    Pack years: 8.75    Types: Cigarettes  . Smokeless tobacco: Never Used  Substance Use Topics  . Alcohol use: Yes    Comment: occ  . Drug use: Not Currently    Types: Marijuana    Home Medications Prior to Admission medications   Medication Sig Start Date End  Date Taking? Authorizing Provider  albuterol (VENTOLIN HFA) 108 (90 Base) MCG/ACT inhaler Inhale 2 puffs into the lungs every 6 (six) hours as needed for wheezing or shortness of breath. 06/13/19 09/14/19 Yes [provider]  aspirin 81 MG EC tablet Take by mouth. 08/01/19 07/31/20 Yes [provider]  azelastine (ASTELIN) 0.1 % nasal spray INHALE 1 SPRAY BY NASAL ROUTE 2 TIMES DAILY AS DIRECTED 08/20/19  Yes [provider]  ezetimibe (ZETIA) 10 MG tablet Take by mouth. 08/01/19 07/31/20 Yes [provider]  ibuprofen (ADVIL) 200 MG tablet Take 200 mg by mouth every 6 (six) hours as needed for headache or mild pain.   Yes [provider]  levothyroxine (SYNTHROID) 175 MCG tablet TAKE 1 TABLET (175 MCG TOTAL) BY MOUTH DAILY BEFORE BREAKFAST. 04/07/19  Yes [name], MD  metFORMIN (GLUCOPHAGE) 500 MG tablet Take 500 mg by mouth daily with breakfast. 04/04/19  Yes [provider]  mirtazapine (REMERON SOL-TAB) 30 MG disintegrating tablet DISSOLVE 1 TABLET UNDER TONGUE AT BEDTIME Patient taking differently: Take 30 mg by mouth at bedtime. Dissolve 1 tablet (30mg ) under tongue at bedtime 04/22/19  Yes [name], MD  omeprazole (PRILOSEC) 40 MG capsule TAKE 1 CAPSULE BY MOUTH TWICE A DAY Patient taking differently: Take 40 mg by mouth in the morning and at bedtime.  02/06/19  Yes [name], MD  acetaminophen (TYLENOL) 500 MG tablet Take 500 mg by mouth every 6 (six) hours as needed for mild pain, moderate pain or headache.    [provider]  Artificial Tear Ointment (DRY EYES OP) Apply 1 drop to eye as needed (dry eyes).    [provider]  diclofenac Sodium (VOLTAREN) 1 % GEL Apply 2 g topically 4 (four) times daily. Patient not taking: Reported on 07/12/2019 06/16/19   Margette Fast, MD  rosuvastatin (CRESTOR) 40 MG tablet Take 40 mg by mouth at bedtime.  02/22/19   [provider]  umeclidinium-vilanterol (ANORO  ELLIPTA) 62.5-25 MCG/INH AEPB Inhale 1 puff into the lungs daily. Patient not taking: Reported on 07/12/2019 04/03/18   Rigoberto Noel, MD    Allergies    Tapentadol  Review of Systems   Review of Systems  Musculoskeletal: Positive for back pain.  All other systems reviewed and are negative.   Physical Exam Updated Vital Signs BP 130/87 (BP Location: Right Arm)   Pulse 86   Temp 98.1 F (36.7 C) (Oral)   Resp 18   Ht 6\' 2"  (1.88 m)   Wt 74.8 kg   SpO2 98%   BMI 21.18 kg/m   Physical Exam Vitals and nursing note reviewed.  Constitutional:      Appearance: He is well-developed.  HENT:  Head: Normocephalic and atraumatic.  Cardiovascular:     Rate and Rhythm: Normal rate and regular rhythm.     Heart sounds: No murmur.  Pulmonary:     Effort: Pulmonary effort is normal. No respiratory distress.     Breath sounds: Normal breath sounds.  Abdominal:     Palpations: Abdomen is soft.     Tenderness: There is no abdominal tenderness. There is no guarding or rebound.  Musculoskeletal:        General: No tenderness.     Comments: Midline lumbar scar with TTP over lower lumbar back  Skin:    General: Skin is warm and dry.  Neurological:     Mental Status: He is alert and oriented to person, place, and time.     Comments: 5/5 strength in proximal BLE, 4/5 strength in bilateral lower extremities distally.  Altered sensation to light touch to the BLE.  5/5 strength to BUE.  Absent patellar reflexes bilaterally (pt guarding on exam making reflex testing difficult).   Psychiatric:        Behavior: Behavior normal.     ED Results / Procedures / Treatments   Labs (all labs ordered are listed, but only abnormal results are displayed) Labs Reviewed - No data to display  EKG None  Radiology No results found.  Procedures Procedures (including critical care time)  Medications Ordered in ED Medications - No data to display  ED Course  I have reviewed the triage vital  signs and the nursing notes.  Pertinent labs & imaging results that were available during my care of the patient were reviewed by me and considered in my medical decision making (see chart for details).    MDM Rules/Calculators/A&P                     Patient with history of lumbar surgery, diabetes here for evaluation of acute on chronic low back pain with paresthesias, low-grade temperatures at home. He is non-toxic appearing on evaluation. He does have significant discomfort secondary to pain. Given reports of low-grade temperatures at home will obtain MRI lumbar spine to rule out abscess. Plan to [facility] to [facility] for advanced [facility]. Discussed with Dr. Sabra Heck in the [facility] emergency department, who accepts the patient in transfer. Patient in agreement with treatment plan at this Mitchell.  Final Clinical Impression(s) / ED Diagnoses Final diagnoses:  None    Rx / DC Orders ED Discharge Orders    None       Quintella Reichert, MD 09/14/19 1605

## 2019-09-14 NOTE — ED Notes (Signed)
Patient verbalizes understanding of discharge instructions. Opportunity for questioning and answers were provided. Armband removed by staff, pt discharged from ED ambulatory.   

## 2019-09-14 NOTE — ED Provider Notes (Signed)
Patient placed in Quick Look pathway, seen and evaluated    Patient arrives POV transfer from [facility] for MRI of the lumbar spine.  Reviewed previous providers note, MRI of lumbar spine is to rule out abscess.  The patient is pleasant well-appearing in no acute distress.  Reports some return of pain since arriving to this ER, additional Percocet has been ordered.  MRI is ordered.  Initiation of care has begun. The patient has been counseled on the process, plan, and necessity for staying for the completion/evaluation, and the remainder of the medical screening examination   Gari Crown 09/14/19 1443    Noemi Chapel, MD 09/15/19 [phone]

## 2019-09-14 NOTE — ED Notes (Signed)
Report given to charge at [facility].  Patient leaving via POV (lyft) at this time.  Patient aware of where to go for MRI.

## 2019-09-14 NOTE — ED Notes (Signed)
Spoke to [place] at [place].  Pt will probably be able to be scanned at 1500.   Will plan to send patient to arrive 2 hours prior for preparation.

## 2019-09-15 LAB — URINE CULTURE: Culture: NO GROWTH

## 2019-10-29 ENCOUNTER — Other Ambulatory Visit: Payer: Self-pay

## 2019-10-29 ENCOUNTER — Ambulatory Visit (INDEPENDENT_AMBULATORY_CARE_PROVIDER_SITE_OTHER): Payer: Medicare Other

## 2019-10-29 ENCOUNTER — Ambulatory Visit (HOSPITAL_COMMUNITY)
Admission: EM | Admit: 2019-10-29 | Discharge: 2019-10-29 | Disposition: A | Discharge status: Home / Self Care | Payer: Medicare Other | Attending: Family Medicine | Admitting: Family Medicine

## 2019-10-29 ENCOUNTER — Encounter (HOSPITAL_COMMUNITY): Payer: Self-pay

## 2019-10-29 DIAGNOSIS — S93509A Unspecified sprain of unspecified toe(s), initial encounter: Secondary | ICD-10-CM | POA: Diagnosis not present

## 2019-10-29 DIAGNOSIS — M25561 Pain in right knee: Secondary | ICD-10-CM

## 2019-10-29 DIAGNOSIS — R55 Syncope and collapse: Secondary | ICD-10-CM

## 2019-10-29 DIAGNOSIS — M79674 Pain in right toe(s): Secondary | ICD-10-CM | POA: Diagnosis not present

## 2019-10-29 DIAGNOSIS — M25474 Effusion, right foot: Secondary | ICD-10-CM

## 2019-10-29 MED ORDER — ACETAMINOPHEN 500 MG PO TABS: 500.0000 mg | ORAL_TABLET | Freq: Four times a day (QID) | ORAL | 0 refills | Status: DC | PRN

## 2019-10-29 MED ORDER — DICLOFENAC SODIUM 1 % EX GEL: 4.0000 g | Freq: Four times a day (QID) | CUTANEOUS | 0 refills | Status: DC

## 2019-10-29 NOTE — ED Triage Notes (Signed)
Pt presents with multiple complaints:  Pt presents with right knee pain, chronic back pain from surgery and right pinky toe pain X 2 days; pt states he has Hx of LOC and Saturday he felt lightheaded & not well so got down on floor of his bathroom, he is unsure if he fully passed out but he states that has happened multiple time and PCP has not had any follow up tests done to find origin of problem.

## 2019-10-29 NOTE — Discharge Instructions (Signed)
Wear the post op shoe for your toe pain Apply voltaren to your knee Take 2 tylenol every 8hours  Follow up ASAP with your primary care to discuss your recent episode  If you have another episode similar, light headedness, chest pain, shortness of breath, dizziness, call 911 or go to the Emergency Department immediately

## 2019-10-29 NOTE — ED Provider Notes (Addendum)
[place]    CSN: [id number] Arrival date & time: 10/29/19  1158      History   Chief Complaint Chief Complaint  Patient presents with  . Multiple Complaints    HPI Lee Mitchell is a 69 y.o. male.   Patient with history of chronic pain and syncopal episodes presents for the right knee and fifth toe pain.  He noticed pain in these yesterday morning.  He reports Saturday evening around 1030 he was eating some leftover pizza and drinking a milkshake when he began to not feel so well.  He reports he went to the restroom where he became very weak felt lightheaded, requiring him to go to the ground.  He does not believe that he lost consciousness however is not completely sure.  He reports laying on the ground for a bit and struggling to get to the toilet where he had diarrhea.  He reports he was on the toilet for about 30 minutes as he was not feeling well and had abdominal cramping.  He reports then going back to the floor as this felt better to lay the ground.  He denies any chest pain or shortness of breath throughout this.  Did not get dizzy.  He reports he remembers most of the episode but cannot be 100% sure they did not lose consciousness. He is not sure how he injured the right toe. No other trauma. He reports this was not as bad as other episodes as he typically did not remember these in the past.  He reports this is his sixth or seventh episode of almost passing out or passing out.  Patient adamantly denies light headedness, chest pain, dizziness, shortness of breath today in clinic and had no similar symptoms in the preceding 24 hours.   Patient requests vicodin.     Past Medical History:  Diagnosis Date  . Chronic abdominal pain   . Colon polyps   . PNA (pneumonia)   . Thyroid cancer [facility])     Patient Active Problem List   Diagnosis Date Noted  . Thallium stress test abnormal 12/22/2018  . Tachycardia 12/14/2018  . DOE (dyspnea on exertion) 12/14/2018   . Abnormal electrocardiogram (ECG) (EKG) 12/14/2018  . Abnormal electrocardiogram 12/14/2018  . Deep vein thrombosis (DVT) of calf muscle vein of left lower extremity ([place]) 12/14/2018  . Encounter for screening for HIV 07/12/2018  . Drug-seeking behavior 05/21/2018  . Opiates and related narcotics causing adverse effect in therapeutic use, initial encounter 05/21/2018  . Left rotator cuff tear 05/18/2018  . Cannabis abuse 04/29/2018  . 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 Type II diabetes mellitus with manifestations ([place]) 04/26/2018  . Hyperlipidemia LDL goal <100 04/26/2018  . Routine general medical examination at a health care facility 04/26/2018  . 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 PSA elevation 04/26/2018  . Hypertension 04/26/2018  . Benign prostatic hyperplasia without lower urinary tract symptoms 04/26/2018  . Encounter for long-term opiate analgesic use 04/26/2018  . Colon cancer screening 04/26/2018  . Centrilobular emphysema ([place]) 04/03/2018  . MDD (major depressive disorder), recurrent episode, severe ([place]) 02/15/2017  . Chronic pain syndrome 02/15/2017  . GERD 03/22/2010    Past Surgical History:  Procedure Laterality Date  . ANKLE ARTHROSCOPY WITH RECONSTRUCTION    . BACK SURGERY    . CHOLECYSTECTOMY    . COLONOSCOPY    . ELBOW ARTHROSCOPY WITH TENDON RECONSTRUCTION    . SHOULDER ARTHROSCOPY WITH LABRAL REPAIR    .  THYROIDECTOMY     2004/2005  . ULNAR NERVE TRANSPOSITION         Home Medications    Prior to Admission medications   Medication Sig Start Date End Date Taking? Authorizing Provider  acetaminophen (TYLENOL) 500 MG tablet Take 1 tablet (500 mg total) by mouth every 6 (six) hours as needed for mild pain, moderate pain or headache. 10/29/19   Azariel Banik, Marguerita Beards, PA-C  albuterol (VENTOLIN HFA) 108 (90 Base) MCG/ACT inhaler Inhale 2 puffs into the lungs every 6 (six) hours as needed for wheezing or shortness of breath.  06/13/19 09/14/19  [provider]  Artificial Tear Ointment (DRY EYES OP) Apply 1 drop to eye as needed (dry eyes).    [provider]  aspirin 81 MG EC tablet Take by mouth. 08/01/19 07/31/20  [provider]  azelastine (ASTELIN) 0.1 % nasal spray INHALE 1 SPRAY BY NASAL ROUTE 2 TIMES DAILY AS DIRECTED 08/20/19   [provider]  diclofenac Sodium (VOLTAREN) 1 % GEL Apply 4 g topically 4 (four) times daily. 10/29/19   Blaize Epple, Marguerita Beards, PA-C  ezetimibe (ZETIA) 10 MG tablet Take by mouth. 08/01/19 07/31/20  [provider]  HYDROcodone-acetaminophen (NORCO/VICODIN) 5-325 MG tablet Take 2 tablets by mouth every 6 (six) hours as needed. 09/14/19   Malvin Johns, MD  ibuprofen (ADVIL) 200 MG tablet Take 200 mg by mouth every 6 (six) hours as needed for headache or mild pain.    [provider]  levothyroxine (SYNTHROID) 175 MCG tablet TAKE 1 TABLET (175 MCG TOTAL) BY MOUTH DAILY BEFORE BREAKFAST. 04/07/19   [name], MD  metFORMIN (GLUCOPHAGE) 500 MG tablet Take 500 mg by mouth daily with breakfast. 04/04/19   [provider]  mirtazapine (REMERON SOL-TAB) 30 MG disintegrating tablet DISSOLVE 1 TABLET UNDER TONGUE AT BEDTIME Patient taking differently: Take 30 mg by mouth at bedtime. Dissolve 1 tablet (30mg ) under tongue at bedtime 04/22/19   [name], MD  omeprazole (PRILOSEC) 40 MG capsule TAKE 1 CAPSULE BY MOUTH TWICE A DAY Patient taking differently: Take 40 mg by mouth in the morning and at bedtime.  02/06/19   [name], MD  rosuvastatin (CRESTOR) 40 MG tablet Take 40 mg by mouth at bedtime.  02/22/19   [provider]  umeclidinium-vilanterol (ANORO ELLIPTA) 62.5-25 MCG/INH AEPB Inhale 1 puff into the lungs daily. Patient not taking: Reported on 07/12/2019 04/03/18   Rigoberto Noel, MD    Family History Family History  Problem Relation Age of Onset  . Kidney cancer Father   . Hypertension Father   . Hypertension  Mother   . Hyperthyroidism Mother   . Cancer Mother 50       thyroid cancer  . Thyroid cancer Maternal Uncle     Social History Social History   Tobacco Use  . Smoking status: Former Smoker    Packs/day: 0.25    Years: 35.00    Pack years: 8.75    Types: Cigarettes  . Smokeless tobacco: Never Used  Vaping Use  . Vaping Use: Never used  Substance Use Topics  . Alcohol use: Yes    Comment: occ  . Drug use: Not Currently    Types: Marijuana     Allergies   Tapentadol   Review of Systems Review of Systems   Physical Exam Triage Vital Signs ED Triage Vitals  Enc Vitals Group     BP 10/29/19 1245 114/84     Pulse Rate  10/29/19 1245 97     Resp 10/29/19 1245 17     Temp 10/29/19 1245 98 F (36.7 C)     Temp Source 10/29/19 1245 Oral     SpO2 10/29/19 1245 96 %     Weight --      Height --      Head Circumference --      Peak Flow --      Pain Score 10/29/19 1243 8     Pain Loc --      Pain Edu? --      Excl. in [place]? --    No data found.  Updated Vital Signs BP 114/84 (BP Location: Right Arm)   Pulse 97   Temp 98 F (36.7 C) (Oral)   Resp 17   SpO2 96%   Visual Acuity Right Eye Distance:   Left Eye Distance:   Bilateral Distance:    Right Eye Near:   Left Eye Near:    Bilateral Near:     Physical Exam Vitals and nursing note reviewed.  Constitutional:      General: He is not in acute distress.    Appearance: He is well-developed. He is not ill-appearing.  HENT:     Head: Normocephalic and atraumatic.  Eyes:     Conjunctiva/sclera: Conjunctivae normal.  Cardiovascular:     Rate and Rhythm: Normal rate and regular rhythm.     Heart sounds: No murmur heard.   Pulmonary:     Effort: Pulmonary effort is normal. No respiratory distress.     Breath sounds: Normal breath sounds.  Abdominal:     Palpations: Abdomen is soft.     Tenderness: There is no abdominal tenderness.  Musculoskeletal:     Cervical back: Neck supple.     Right lower  leg: No edema.     Left lower leg: No edema.     Comments: Right fifth toe with erythema and ecchymosis. No deformity. TTP and with manipulation of toe.  Patient is ambulatory with use of a golf club used as a cane  Right knee without swelling, ecchymosis or deformity. No boney TTP. Full ROM, no crepitus. Stable joint. Bearing weight.     Skin:    General: Skin is warm and dry.  Neurological:     General: No focal deficit present.     Mental Status: He is alert and oriented to person, place, and time.     Cranial Nerves: No cranial nerve deficit.     Motor: No weakness.     Coordination: Coordination normal.     Comments: Patient ambulatory.   Psychiatric:        Mood and Affect: Mood normal.        Behavior: Behavior normal.        Thought Content: Thought content normal.        Judgment: Judgment normal.      UC Treatments / Results  Labs (all labs ordered are listed, but only abnormal results are displayed) Labs Reviewed - No data to display  EKG   Radiology DG Foot Complete Right  Result Date: 10/29/2019 CLINICAL DATA:  Pain and swelling fifth toe.  Recent fall EXAM: RIGHT FOOT COMPLETE - 3+ VIEW COMPARISON:  None. FINDINGS: Negative for fracture. Mild soft tissue swelling around the fifth metatarsal phalangeal joint. No erosion or arthropathy Mild hallux valgus deformity. Mild soft tissue swelling overlying the first metatarsophalangeal joint. IMPRESSION: Negative for fracture. Electronically Signed   By: Franchot Gallo  M.D.   On: 10/29/2019 13:51    Procedures Procedures (including critical care time)  Medications Ordered in UC Medications - No data to display  Initial Impression / Assessment and Plan / UC Course  I have reviewed the triage vital signs and the nursing notes.  Pertinent labs & imaging results that were available during my care of the patient were reviewed by me and considered in my medical decision making (see chart for details).     #Sprain  of toe #Right knee pain #Near syncope Patient is a 69 year old gentleman with history of chronic pain and syncopal episodes presenting with sprain of right pinky toe as well as contusion of the right knee.  He has had a work-ups for presyncope and syncope over the preceding years without significant findings. To include hospital work up in 06/2019 and [facility] 09/2019 that showed no CAD. Patient appears to be fully recovered from his episode on Saturday evening, given this we will defer continued work-up to primary care.  X-ray was negative for fracture in the foot.  Placed in postop shoe for comfort.  Discussed with patient that I would not prescribe opiate pain medicine that he needs to follow-up with his pain management as well as primary care to discuss this.  Discussed use of Voltaren and Tylenol.  Strict emergency department and follow-up precautions were discussed.  Patient verbalized understanding plan of care. - Case discussed with attending physician Dr. Meda Coffee. Final Clinical Impressions(s) / UC Diagnoses   Final diagnoses:  Sprain of toe, initial encounter  Acute pain of right knee  Near syncope     Discharge Instructions     Wear the post op shoe for your toe pain Apply voltaren to your knee Take 2 tylenol every 8hours  Follow up ASAP with your primary care to discuss your recent episode  If you have another episode similar, light headedness, chest pain, shortness of breath, dizziness, call 911 or go to the Emergency Department immediately      ED Prescriptions    Medication Sig Dispense Auth. Provider   diclofenac Sodium (VOLTAREN) 1 % GEL Apply 4 g topically 4 (four) times daily. 100 g Iantha Titsworth, Marguerita Beards, PA-C   acetaminophen (TYLENOL) 500 MG tablet Take 1 tablet (500 mg total) by mouth every 6 (six) hours as needed for mild pain, moderate pain or headache. 30 tablet Braylen Staller, Marguerita Beards, PA-C     I have reviewed the PDMP during this encounter.   Purnell Shoemaker, PA-C 10/29/19 2158     Aerica Rincon, Marguerita Beards, PA-C 10/30/19 0008

## 2020-01-16 ENCOUNTER — Ambulatory Visit: Payer: Self-pay

## 2020-01-16 ENCOUNTER — Ambulatory Visit (INDEPENDENT_AMBULATORY_CARE_PROVIDER_SITE_OTHER): Payer: Medicare Other | Admitting: Orthopaedic Surgery

## 2020-01-16 ENCOUNTER — Encounter: Payer: Self-pay | Admitting: Orthopaedic Surgery

## 2020-01-16 VITALS — BP 124/70 | HR 95

## 2020-01-16 DIAGNOSIS — M25561 Pain in right knee: Secondary | ICD-10-CM

## 2020-01-16 MED ORDER — METHYLPREDNISOLONE ACETATE 40 MG/ML IJ SUSP
40.0000 mg | INTRAMUSCULAR | Status: AC | PRN
  Administered 2020-01-16: 40 mg via INTRA_ARTICULAR

## 2020-01-16 MED ORDER — LIDOCAINE HCL 1 % IJ SOLN
0.5000 mL | INTRAMUSCULAR | Status: AC | PRN
  Administered 2020-01-16: .5 mL

## 2020-01-16 MED ORDER — BUPIVACAINE HCL 0.25 % IJ SOLN
4.0000 mL | INTRAMUSCULAR | Status: AC | PRN
  Administered 2020-01-16: 4 mL via INTRA_ARTICULAR

## 2020-01-16 NOTE — Progress Notes (Signed)
Office Visit Note   Patient: Lee Mitchell           Date of Birth: 12/06/1950           MRN: [id number] Visit Date: 01/16/2020              Requested by: Doreatha Lew, MD [address] [place],  [place] [address] PCP: Patrecia Pour, Christean Grief, MD   Assessment & Plan: Visit Diagnoses:  1. Right knee pain, unspecified chronicity     Plan: Knee injection performed.  We discussed being careful, fall prevention.  He will call if he is having persistent symptoms we can obtain an MRI scan of his right knee rule out lateral meniscal tear.  Hopefully he will respond to the cortisone injection.  Follow-Up Instructions: No follow-ups on file.   Orders:  Orders Placed This Encounter  Procedures   XR KNEE 3 VIEW RIGHT   No orders of the defined types were placed in this encounter.     Procedures: Large Joint Inj: R knee on 01/16/2020 9:58 AM Indications: pain and joint swelling Details: 22 G 1.5 in needle, anterolateral approach  Arthrogram: No  Medications: 40 mg methylPREDNISolone acetate 40 MG/ML; 0.5 mL lidocaine 1 %; 4 mL bupivacaine 0.25 % Outcome: tolerated well, no immediate complications Procedure, treatment alternatives, risks and benefits explained, specific risks discussed. Consent was given by the patient. Immediately prior to procedure a time out was called to verify the correct patient, procedure, equipment, support staff and site/side marked as required. Patient was prepped and draped in the usual sterile fashion.       Clinical Data: No additional findings.   Subjective: Chief Complaint  Patient presents with   Right Knee - Pain    HPI 69 year old male seen with history of fall with right knee injury 2 months ago.  He has had persistent pain primarily anterolaterally has tried ice, heat, ibuprofen.  States his knee feels like he wants to buckle.  He had a posterior 3 level lumbar fusion L3-S1 done at [facility].  He has been long-term pain  management narcotic medication in past years.  He denies any swelling in his right knee.  He has difficulty getting from sitting standing related to s [employer] website risk orders are tiffness in his back after his fusion procedure.  [employer] website risk for 710.  Review of Systems all other systems are negative is a contribute HPI.   Objective: Vital Signs: BP 124/70    Pulse 95   Physical Exam Constitutional:      Appearance: He is well-developed.  HENT:     Head: Normocephalic and atraumatic.  Eyes:     Pupils: Pupils are equal, round, and reactive to light.  Neck:     Thyroid: No thyromegaly.     Trachea: No tracheal deviation.  Cardiovascular:     Rate and Rhythm: Normal rate.  Pulmonary:     Effort: Pulmonary effort is normal.     Breath sounds: No wheezing.  Abdominal:     General: Bowel sounds are normal.     Palpations: Abdomen is soft.  Skin:    General: Skin is warm and dry.     Capillary Refill: Capillary refill takes less than 2 seconds.  Neurological:     Mental Status: He is alert and oriented to person, place, and time.  Psychiatric:        Behavior: Behavior normal.  Thought Content: Thought content normal.        Judgment: Judgment normal.     Ortho Exam patient has negative logroll the hips has some pain with straight leg raising right and left.  Iliotibial band insertion site is normal he has some tenderness at the lateral joint line no knee effusion.  Collateral ligaments are stable.  He has minimal medial joint line tenderness.  ACL PCL exam is normal negative anterior drawer.  He has pain with full extension he can flex 120 degrees.  Specialty Comments:  No specialty comments available.  Imaging: No results found.   PMFS History: Patient Active Problem List   Diagnosis Date Noted   Thallium stress test abnormal 12/22/2018   Tachycardia 12/14/2018   DOE (dyspnea on exertion) 12/14/2018   Abnormal electrocardiogram (ECG) (EKG) 12/14/2018    Abnormal electrocardiogram 12/14/2018   Deep vein thrombosis (DVT) of calf muscle vein of left lower extremity ([place]) 12/14/2018   Encounter for screening for HIV 07/12/2018   Drug-seeking behavior 05/21/2018   Opiates and related narcotics causing adverse effect in therapeutic use, initial encounter 05/21/2018   Left rotator cuff tear 05/18/2018   Cannabis abuse 04/29/2018   Postoperative hypothyroidism 04/26/2018   Type II diabetes mellitus with manifestations ([place]) 04/26/2018   Hyperlipidemia LDL goal <100 04/26/2018   Routine general medical examination at a health care facility 04/26/2018   Primary osteoarthritis involving multiple joints 04/26/2018   PSA elevation 04/26/2018   Hypertension 04/26/2018   Benign prostatic hyperplasia without lower urinary tract symptoms 04/26/2018   Encounter for long-term opiate analgesic use 04/26/2018   Colon cancer screening 04/26/2018   Centrilobular emphysema ([place]) 04/03/2018   MDD (major depressive disorder), recurrent episode, severe ([place]) 02/15/2017   Chronic pain syndrome 02/15/2017   GERD 03/22/2010   Past Medical History:  Diagnosis Date   Chronic abdominal pain    Colon polyps    PNA (pneumonia)    Thyroid cancer ([place])     Family History  Problem Relation Age of Onset   Kidney cancer Father    Hypertension Father    Hypertension Mother    Hyperthyroidism Mother    Cancer Mother 64       thyroid cancer   Thyroid cancer Maternal Uncle     Past Surgical History:  Procedure Laterality Date   ANKLE ARTHROSCOPY WITH RECONSTRUCTION     BACK SURGERY     CHOLECYSTECTOMY     COLONOSCOPY     ELBOW ARTHROSCOPY WITH TENDON RECONSTRUCTION     SHOULDER ARTHROSCOPY WITH LABRAL REPAIR     THYROIDECTOMY     2004/2005   ULNAR NERVE TRANSPOSITION     Social History   Occupational History   Occupation: event planner   Tobacco Use   Smoking status: Former Smoker    Packs/day: 0.25    Years:  35.00    Pack years: 8.75    Types: Cigarettes   Smokeless tobacco: Never Used  Scientific laboratory technician Use: Never used  Substance and Sexual Activity   Alcohol use: Yes    Comment: occ   Drug use: Not Currently    Types: Marijuana   Sexual activity: Not Currently

## 2020-03-07 ENCOUNTER — Ambulatory Visit: Payer: Medicare Other | Admitting: Orthopaedic Surgery

## 2020-03-11 ENCOUNTER — Ambulatory Visit: Payer: Medicare Other | Admitting: Orthopaedic Surgery

## 2020-03-18 ENCOUNTER — Ambulatory Visit (INDEPENDENT_AMBULATORY_CARE_PROVIDER_SITE_OTHER): Payer: Medicare Other | Admitting: Orthopaedic Surgery

## 2020-03-18 ENCOUNTER — Encounter: Payer: Self-pay | Admitting: Orthopaedic Surgery

## 2020-03-18 ENCOUNTER — Other Ambulatory Visit: Payer: Self-pay

## 2020-03-18 DIAGNOSIS — M25561 Pain in right knee: Secondary | ICD-10-CM

## 2020-03-18 NOTE — Progress Notes (Signed)
Office Visit Note   Patient: Lee Mitchell           Date of Birth: 1950/10/23           MRN: [id number] Visit Date: 03/18/2020              Requested by: Doreatha Lew, MD [address] [place],  [place] [address] PCP: Patrecia Pour, Christean Grief, MD   Assessment & Plan: Visit Diagnoses:  1. Right knee pain, unspecified chronicity     Plan: Patient requested some short-term narcotic medication to help with this pain.  He has a presentation he is trying to get ready and has been under increased stress.  I discussed with him that I was unable to provide this.  Ligamentous knee exam of his knee is normal and he has no effusion.  Follow-Up Instructions: No follow-ups on file.   Orders:  No orders of the defined types were placed in this encounter.  No orders of the defined types were placed in this encounter.     Procedures: No procedures performed   Clinical Data: No additional findings.   Subjective: Chief Complaint  Patient presents with  . Right Knee - Pain    HPI 69 year old male is seen with complaints of right knee pain.  He was seen in September with ongoing problems with his knee.  He had 3 level posterior lumbar fusion L3-S1 done in [place] by Dr. Sherlyn Lick.  Long history of pain medication use.  September [employer] website risk was 710.  He was living in [place] following pain clinic.  Patient been working on recent plan and has a presentation coming up and has been under a significant increased stress.  Current [employer] shows narcotics 350, sedative to 30 overall risk 690.  Last tramadol prescription was in June prescribed in [facility].  Numerous providers..  Patient had a knee scope years ago.  Patient had knee injection states it helped briefly but does not want it again.  He states she is having increased problems walking.  History of drug-seeking procedure.  21 providers listed on [employer] website.  Review of Systems all other systems  are noncontributory to HPI.   Objective: Vital Signs: Ht 6\' 1"  (1.854 m)   Wt 175 lb (79.4 kg)   BMI 23.09 kg/m   Physical Exam Constitutional:      Appearance: He is well-developed.  HENT:     Head: Normocephalic and atraumatic.  Eyes:     Pupils: Pupils are equal, round, and reactive to light.  Neck:     Thyroid: No thyromegaly.     Trachea: No tracheal deviation.  Cardiovascular:     Rate and Rhythm: Normal rate.  Pulmonary:     Effort: Pulmonary effort is normal.     Breath sounds: No wheezing.  Abdominal:     General: Bowel sounds are normal.     Palpations: Abdomen is soft.  Skin:    General: Skin is warm and dry.     Capillary Refill: Capillary refill takes less than 2 seconds.  Neurological:     Mental Status: He is alert and oriented to person, place, and time.  Psychiatric:        Behavior: Behavior normal.        Thought Content: Thought content normal.        Judgment: Judgment normal.     Ortho Exam patient is able ambulate well-healed lumbar incision no knee effusion  is noted.  He has extension of flexion.  He complains of pain with ambulation in the right knee.  Negative logroll to the hips right and left. Specialty Comments:  No specialty comments available.  Imaging: No results found.   PMFS History: Patient Active Problem List   Diagnosis Date Noted  . Pain in right knee 03/24/2020  . Thallium stress test abnormal 12/22/2018  . Tachycardia 12/14/2018  . DOE (dyspnea on exertion) 12/14/2018  . Abnormal electrocardiogram (ECG) (EKG) 12/14/2018  . Abnormal electrocardiogram 12/14/2018  . Deep vein thrombosis (DVT) of calf muscle vein of left lower extremity ([place]) 12/14/2018  . Encounter for screening for HIV 07/12/2018  . Drug-seeking behavior 05/21/2018  . Opiates and related narcotics causing adverse effect in therapeutic use, initial encounter 05/21/2018  . Left rotator cuff tear 05/18/2018  . Cannabis abuse 04/29/2018  . Postoperative  hypothyroidism 04/26/2018  . Type II diabetes mellitus with manifestations ([place]) 04/26/2018  . Hyperlipidemia LDL goal <100 04/26/2018  . Routine general medical examination at a health care facility 04/26/2018  . 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 PSA elevation 04/26/2018  . Hypertension 04/26/2018  . Benign prostatic hyperplasia without lower urinary tract symptoms 04/26/2018  . Encounter for long-term opiate analgesic use 04/26/2018  . Colon cancer screening 04/26/2018  . Centrilobular emphysema ([place]) 04/03/2018  . MDD (major depressive disorder), recurrent episode, severe ([place]) 02/15/2017  . Chronic pain syndrome 02/15/2017  . GERD 03/22/2010   Past Medical History:  Diagnosis Date  . Chronic abdominal pain   . Colon polyps   . PNA (pneumonia)   . Thyroid cancer ([place])     Family History  Problem Relation Age of Onset  . Kidney cancer Father   . Hypertension Father   . Hypertension Mother   . Hyperthyroidism Mother   . Cancer Mother 55       thyroid cancer  . Thyroid cancer Maternal Uncle     Past Surgical History:  Procedure Laterality Date  . ANKLE ARTHROSCOPY WITH RECONSTRUCTION    . BACK SURGERY    . CHOLECYSTECTOMY    . COLONOSCOPY    . ELBOW ARTHROSCOPY WITH TENDON RECONSTRUCTION    . SHOULDER ARTHROSCOPY WITH LABRAL REPAIR    . THYROIDECTOMY     2004/2005  . ULNAR NERVE TRANSPOSITION     Social History   Occupational History  . Occupation: event planner   Tobacco Use  . Smoking status: Former Smoker    Packs/day: 0.25    Years: 35.00    Pack years: 8.75    Types: Cigarettes  . Smokeless tobacco: Never Used  Vaping Use  . Vaping Use: Never used  Substance and Sexual Activity  . Alcohol use: Yes    Comment: occ  . Drug use: Not Currently    Types: Marijuana  . Sexual activity: Not Currently

## 2020-03-20 ENCOUNTER — Other Ambulatory Visit: Payer: Self-pay | Admitting: Orthopaedic Surgery

## 2020-03-24 DIAGNOSIS — M25561 Pain in right knee: Secondary | ICD-10-CM | POA: Insufficient documentation

## 2020-11-04 ENCOUNTER — Ambulatory Visit: Payer: Medicare Other | Admitting: Podiatry

## 2020-11-13 ENCOUNTER — Ambulatory Visit: Payer: Medicare Other | Admitting: Podiatry

## 2020-11-13 ENCOUNTER — Ambulatory Visit (INDEPENDENT_AMBULATORY_CARE_PROVIDER_SITE_OTHER): Payer: Medicare Other

## 2020-11-13 ENCOUNTER — Other Ambulatory Visit: Payer: Self-pay

## 2020-11-13 DIAGNOSIS — M7750 Other enthesopathy of unspecified foot: Secondary | ICD-10-CM

## 2020-11-13 DIAGNOSIS — M722 Plantar fascial fibromatosis: Secondary | ICD-10-CM | POA: Diagnosis not present

## 2020-11-13 DIAGNOSIS — M79671 Pain in right foot: Secondary | ICD-10-CM

## 2020-11-13 DIAGNOSIS — M7989 Other specified soft tissue disorders: Secondary | ICD-10-CM | POA: Diagnosis not present

## 2020-11-13 DIAGNOSIS — G5761 Lesion of plantar nerve, right lower limb: Secondary | ICD-10-CM

## 2020-11-13 DIAGNOSIS — G629 Polyneuropathy, unspecified: Secondary | ICD-10-CM | POA: Diagnosis not present

## 2020-11-13 DIAGNOSIS — T148XXA Other injury of unspecified body region, initial encounter: Secondary | ICD-10-CM

## 2020-11-13 MED ORDER — MELOXICAM 15 MG PO TABS: 15.0000 mg | ORAL_TABLET | Freq: Every day | ORAL | 0 refills | Status: DC

## 2020-11-13 NOTE — Progress Notes (Signed)
Subjective:   Patient ID: Lee Mitchell, male   DOB: 70 y.o.   MRN: [id number]   HPI 70 year old male presents the office today with concerns of right foot pain, left ankle discomfort.  The right side is been ongoing about 1 year.  He states that he gets a sharp pain on the forefoot area on the plantar aspect.  He states that about a year ago he noticed the symptoms.  He states it lasted for short time did not have any symptoms for couple months and then when he was putting pressure on the foot again he developed a sharp pain.  Points along the plantar aspect of the forefoot just proximal to the metatarsal heads where he gets the discomfort.  Also has concern of left ankle discomfort pointing along the medial malleolus.  There is history of ankle reconstruction.  Previously had bone spurs removed and feels there is something floating along the ankle.  He said no recent treatment.  Issues.  No other concerns.   Last A1c 6.7 on 11/11/2020 History of DVT left leg ROS  Past Medical History:  Diagnosis Date   Chronic abdominal pain    Colon polyps    PNA (pneumonia)    Thyroid cancer ([place])     Past Surgical History:  Procedure Laterality Date   ANKLE ARTHROSCOPY WITH RECONSTRUCTION     BACK SURGERY     CHOLECYSTECTOMY     COLONOSCOPY     ELBOW ARTHROSCOPY WITH TENDON RECONSTRUCTION     SHOULDER ARTHROSCOPY WITH LABRAL REPAIR     THYROIDECTOMY     2004/2005   ULNAR NERVE TRANSPOSITION       Current Outpatient Medications:    albuterol (VENTOLIN HFA) 108 (90 Base) MCG/ACT inhaler, Inhale 2 puffs into the lungs every 6 (six) hours as needed for wheezing or shortness of breath., Disp: , Rfl:    azelastine (ASTELIN) 0.1 % nasal spray, INHALE 1 SPRAY BY NASAL ROUTE 2 TIMES DAILY AS DIRECTED, Disp: , Rfl:    ezetimibe (ZETIA) 10 MG tablet, Take by mouth., Disp: , Rfl:    ibuprofen (ADVIL) 200 MG tablet, Take 200 mg by mouth every 6 (six) hours as needed for headache or mild pain.,  Disp: , Rfl:    levothyroxine (SYNTHROID) 175 MCG tablet, TAKE 1 TABLET (175 MCG TOTAL) BY MOUTH DAILY BEFORE BREAKFAST., Disp: 90 tablet, Rfl: 1   metFORMIN (GLUCOPHAGE) 500 MG tablet, Take 500 mg by mouth daily with breakfast., Disp: , Rfl:    mirtazapine (REMERON SOL-TAB) 30 MG disintegrating tablet, DISSOLVE 1 TABLET UNDER TONGUE AT BEDTIME (Patient taking differently: Take 30 mg by mouth at bedtime. Dissolve 1 tablet ('30mg'$ ) under tongue at bedtime), Disp: 90 tablet, Rfl: 0   omeprazole (PRILOSEC) 40 MG capsule, TAKE 1 CAPSULE BY MOUTH TWICE A DAY (Patient taking differently: Take 40 mg by mouth in the morning and at bedtime. ), Disp: 90 capsule, Rfl: 1   rosuvastatin (CRESTOR) 40 MG tablet, Take 40 mg by mouth at bedtime. , Disp: , Rfl:    umeclidinium-vilanterol (ANORO ELLIPTA) 62.5-25 MCG/INH AEPB, Inhale 1 puff into the lungs daily. (Patient not taking: Reported on 07/12/2019), Disp: 1 each, Rfl: 0  No Active Allergies        Objective:  Physical Exam  General: AAO x3, NAD  Dermatological: Skin is warm, dry and supple bilateral.  There are no open sores, no preulcerative lesions, no rash or signs of infection present.  Vascular: Dorsalis Pedis artery and  Posterior Tibial artery pedal pulses are 2/4 bilateral with immedate capillary fill time. There is no pain with calf compression, swelling, warmth, erythema.   Neruologic: Sensation decreased with Semmes Weinstein monofilament was to the forefoot with the right side worse than left.  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  On the right side there is the majority of tenderness localized along the central aspect of the forefoot along the plantar fascia, musculature proximal metatarsal heads.  There does appear to be neuromas versus bursa is on the interspaces mostly along the second and third interspaces.  There is no pain to the toes.  No pain to the ankle.  On the left ankle there is tenderness along medial malleolus.   Not able to identify any soft tissue mass, bone spur.  There is no edema, erythema.  There is no palpable mass identified.  Muscular strength 5/5 in all groups tested bilateral.  Gait: Unassisted, Nonantalgic.       Assessment:   Left foot pain, rule out soft tissue mass; left ankle pain with history of reconstruction     Plan:  -Treatment options discussed including all alternatives, risks, and complications -Etiology of symptoms were discussed -X-rays obtained and reviewed.  No evidence of acute fracture identified bilaterally.  No significant bone spur identified in the left ankle in particular. -I do think a lot of the symptoms are multifactorial.  He does have some neuropathy but this seems to be ongoing.  He has a history of back surgery he states that the "dead feeling" was ongoing prior to his surgery.  I also think that part of his symptoms are biomechanical in nature given his cavus foot type.  We discussed general stretching exercises particularly right foot as well as anti-inflammatories and I prescribed meloxicam.  We can also do a power step insert (he left today before we were able to give this to him).  -Given his symptoms and ongoing nature will order MRI of the left ankle as well as right foot.  -Pending MRI consider neurology evaluation for the neuropathy as well.  Trula Slade DPM

## 2020-11-17 ENCOUNTER — Other Ambulatory Visit: Payer: Self-pay | Admitting: Podiatry

## 2020-11-17 DIAGNOSIS — M7750 Other enthesopathy of unspecified foot: Secondary | ICD-10-CM

## 2020-11-17 DIAGNOSIS — M722 Plantar fascial fibromatosis: Secondary | ICD-10-CM

## 2020-11-21 ENCOUNTER — Telehealth: Payer: Self-pay | Admitting: *Deleted

## 2020-11-21 NOTE — Telephone Encounter (Signed)
Called and spoke with the [facility] and patient has an appointment on 12-01-2020. Lattie Haw

## 2020-11-21 NOTE — Telephone Encounter (Signed)
-----   Message from Trula Slade, DPM sent at 11/14/2020  7:53 AM EDT ----- I have ordered a MRI of the left ankle and right foot if you can please follow up on this. Thanks!

## 2020-12-01 ENCOUNTER — Other Ambulatory Visit: Payer: Medicare Other

## 2020-12-10 ENCOUNTER — Other Ambulatory Visit: Payer: Self-pay

## 2020-12-10 ENCOUNTER — Ambulatory Visit
Admission: RE | Admit: 2020-12-10 | Discharge: 2020-12-10 | Disposition: A | Discharge status: Home / Self Care | Payer: Medicare Other | Source: Ambulatory Visit | Attending: Podiatry | Admitting: Podiatry

## 2020-12-10 DIAGNOSIS — M7750 Other enthesopathy of unspecified foot: Secondary | ICD-10-CM

## 2020-12-10 DIAGNOSIS — M7989 Other specified soft tissue disorders: Secondary | ICD-10-CM

## 2020-12-10 DIAGNOSIS — M722 Plantar fascial fibromatosis: Secondary | ICD-10-CM

## 2020-12-10 DIAGNOSIS — G5761 Lesion of plantar nerve, right lower limb: Secondary | ICD-10-CM

## 2020-12-10 DIAGNOSIS — T148XXA Other injury of unspecified body region, initial encounter: Secondary | ICD-10-CM

## 2020-12-12 ENCOUNTER — Telehealth: Payer: Self-pay | Admitting: Podiatry

## 2020-12-12 NOTE — Telephone Encounter (Signed)
Attempted to call patient to go over MRI results. The phone kept ringing, no answer and no voicemail.

## 2020-12-16 ENCOUNTER — Telehealth: Payer: Self-pay | Admitting: *Deleted

## 2020-12-16 NOTE — Telephone Encounter (Signed)
Called and left a message for medical records to get the CD ready. Lattie Haw

## 2020-12-16 NOTE — Telephone Encounter (Signed)
-----   Message from Trula Slade, DPM sent at 12/12/2020  9:53 AM EDT ----- Lee Mitchell- can you send his MRI out for an overread on the right foot and left ankle? Thanks!

## 2020-12-18 ENCOUNTER — Telehealth: Payer: Self-pay | Admitting: *Deleted

## 2020-12-18 ENCOUNTER — Encounter: Payer: Self-pay | Admitting: Podiatry

## 2020-12-18 NOTE — Telephone Encounter (Signed)
[facility] sent the CD to the [place] office today. Lee Mitchell

## 2020-12-18 NOTE — Telephone Encounter (Signed)
-----   Message from Trula Slade, DPM sent at 12/12/2020  9:53 AM EDT ----- Lattie Haw- can you send his MRI out for an overread on the right foot and left ankle? Thanks!

## 2020-12-19 ENCOUNTER — Emergency Department (HOSPITAL_BASED_OUTPATIENT_CLINIC_OR_DEPARTMENT_OTHER)
Admission: EM | Admit: 2020-12-19 | Discharge: 2020-12-19 | Disposition: A | Discharge status: Home / Self Care | Payer: Medicare Other | Attending: Emergency Medicine | Admitting: Emergency Medicine

## 2020-12-19 ENCOUNTER — Other Ambulatory Visit: Payer: Self-pay

## 2020-12-19 ENCOUNTER — Ambulatory Visit (HOSPITAL_COMMUNITY)
Admission: RE | Admit: 2020-12-19 | Discharge: 2020-12-19 | Payer: Medicare Other | Source: Ambulatory Visit | Attending: Family Medicine | Admitting: Family Medicine

## 2020-12-19 ENCOUNTER — Emergency Department (HOSPITAL_BASED_OUTPATIENT_CLINIC_OR_DEPARTMENT_OTHER): Payer: Medicare Other

## 2020-12-19 ENCOUNTER — Encounter (HOSPITAL_COMMUNITY): Payer: Self-pay

## 2020-12-19 ENCOUNTER — Encounter (HOSPITAL_BASED_OUTPATIENT_CLINIC_OR_DEPARTMENT_OTHER): Payer: Self-pay | Admitting: Emergency Medicine

## 2020-12-19 VITALS — BP 118/74 | HR 90 | Temp 98.2°F

## 2020-12-19 DIAGNOSIS — K625 Hemorrhage of anus and rectum: Secondary | ICD-10-CM | POA: Diagnosis not present

## 2020-12-19 DIAGNOSIS — I2510 Atherosclerotic heart disease of native coronary artery without angina pectoris: Secondary | ICD-10-CM | POA: Diagnosis not present

## 2020-12-19 DIAGNOSIS — R140 Abdominal distension (gaseous): Secondary | ICD-10-CM | POA: Diagnosis not present

## 2020-12-19 DIAGNOSIS — R1030 Lower abdominal pain, unspecified: Secondary | ICD-10-CM

## 2020-12-19 DIAGNOSIS — Z85850 Personal history of malignant neoplasm of thyroid: Secondary | ICD-10-CM | POA: Diagnosis not present

## 2020-12-19 DIAGNOSIS — Z87891 Personal history of nicotine dependence: Secondary | ICD-10-CM | POA: Diagnosis not present

## 2020-12-19 DIAGNOSIS — R1084 Generalized abdominal pain: Secondary | ICD-10-CM | POA: Diagnosis present

## 2020-12-19 DIAGNOSIS — E119 Type 2 diabetes mellitus without complications: Secondary | ICD-10-CM | POA: Diagnosis not present

## 2020-12-19 DIAGNOSIS — R195 Other fecal abnormalities: Secondary | ICD-10-CM | POA: Diagnosis not present

## 2020-12-19 DIAGNOSIS — Z79899 Other long term (current) drug therapy: Secondary | ICD-10-CM | POA: Insufficient documentation

## 2020-12-19 DIAGNOSIS — K529 Noninfective gastroenteritis and colitis, unspecified: Secondary | ICD-10-CM | POA: Diagnosis not present

## 2020-12-19 DIAGNOSIS — Z7984 Long term (current) use of oral hypoglycemic drugs: Secondary | ICD-10-CM | POA: Diagnosis not present

## 2020-12-19 DIAGNOSIS — I10 Essential (primary) hypertension: Secondary | ICD-10-CM | POA: Insufficient documentation

## 2020-12-19 DIAGNOSIS — E039 Hypothyroidism, unspecified: Secondary | ICD-10-CM | POA: Diagnosis not present

## 2020-12-19 LAB — URINALYSIS, ROUTINE W REFLEX MICROSCOPIC
Bilirubin Urine: NEGATIVE
Glucose, UA: NEGATIVE mg/dL
Hgb urine dipstick: NEGATIVE
Ketones, ur: NEGATIVE mg/dL
Nitrite: NEGATIVE
Protein, ur: 30 mg/dL — AB
Specific Gravity, Urine: 1.023 (ref 1.005–1.030)
pH: 6.5 (ref 5.0–8.0)

## 2020-12-19 LAB — CBC
HCT: 42.1 % (ref 39.0–52.0)
Hemoglobin: 14 g/dL (ref 13.0–17.0)
MCH: 28.9 pg (ref 26.0–34.0)
MCHC: 33.3 g/dL (ref 30.0–36.0)
MCV: 86.8 fL (ref 80.0–100.0)
Platelets: 337 K/uL (ref 150–400)
RBC: 4.85 MIL/uL (ref 4.22–5.81)
RDW: 14 % (ref 11.5–15.5)
WBC: 12.4 K/uL — ABNORMAL HIGH (ref 4.0–10.5)
nRBC: 0 % (ref 0.0–0.2)

## 2020-12-19 LAB — COMPREHENSIVE METABOLIC PANEL WITH GFR
ALT: 36 U/L (ref 0–44)
AST: 24 U/L (ref 15–41)
Albumin: 4.3 g/dL (ref 3.5–5.0)
Alkaline Phosphatase: 83 U/L (ref 38–126)
Anion gap: 8 (ref 5–15)
BUN: 17 mg/dL (ref 8–23)
CO2: 26 mmol/L (ref 22–32)
Calcium: 9.1 mg/dL (ref 8.9–10.3)
Chloride: 104 mmol/L (ref 98–111)
Creatinine, Ser: 1.03 mg/dL (ref 0.61–1.24)
GFR, Estimated: >60 mL/min
Glucose, Bld: 119 mg/dL — ABNORMAL HIGH (ref 70–99)
Potassium: 4.1 mmol/L (ref 3.5–5.1)
Sodium: 138 mmol/L (ref 135–145)
Total Bilirubin: 0.4 mg/dL (ref 0.3–1.2)
Total Protein: 7.1 g/dL (ref 6.5–8.1)

## 2020-12-19 LAB — LIPASE, BLOOD: Lipase: 16 U/L (ref 11–51)

## 2020-12-19 MED ORDER — ONDANSETRON 8 MG PO TBDP: 8.0000 mg | ORAL_TABLET | Freq: Three times a day (TID) | ORAL | 0 refills | Status: DC | PRN

## 2020-12-19 MED ORDER — MORPHINE SULFATE (PF) 4 MG/ML IV SOLN
6.0000 mg | Freq: Once | INTRAVENOUS | Status: AC
  Administered 2020-12-19: 6 mg via INTRAVENOUS
  Filled 2020-12-19: qty 2

## 2020-12-19 MED ORDER — CIPROFLOXACIN HCL 500 MG PO TABS
500.0000 mg | ORAL_TABLET | Freq: Once | ORAL | Status: AC
  Administered 2020-12-19: 500 mg via ORAL
  Filled 2020-12-19: qty 1

## 2020-12-19 MED ORDER — DICYCLOMINE HCL 20 MG PO TABS: 20.0000 mg | ORAL_TABLET | Freq: Two times a day (BID) | ORAL | 0 refills | Status: DC

## 2020-12-19 MED ORDER — CIPROFLOXACIN HCL 500 MG PO TABS: 500.0000 mg | ORAL_TABLET | Freq: Two times a day (BID) | ORAL | 0 refills | Status: DC

## 2020-12-19 MED ORDER — IOHEXOL 350 MG/ML SOLN
80.0000 mL | Freq: Once | INTRAVENOUS | Status: AC | PRN
  Administered 2020-12-19: 80 mL via INTRAVENOUS

## 2020-12-19 MED ORDER — SODIUM CHLORIDE 0.9 % IV BOLUS
1000.0000 mL | Freq: Once | INTRAVENOUS | Status: AC
  Administered 2020-12-19: 1000 mL via INTRAVENOUS

## 2020-12-19 MED ORDER — METRONIDAZOLE 500 MG PO TABS: 500.0000 mg | ORAL_TABLET | Freq: Two times a day (BID) | ORAL | 0 refills | Status: DC

## 2020-12-19 MED ORDER — KETOROLAC TROMETHAMINE 30 MG/ML IJ SOLN: 15.0000 mg | Freq: Once | INTRAMUSCULAR | Status: DC

## 2020-12-19 MED ORDER — OXYCODONE-ACETAMINOPHEN 5-325 MG PO TABS
1.0000 | ORAL_TABLET | Freq: Once | ORAL | Status: AC
Start: 2020-12-19 — End: 2020-12-19
  Administered 2020-12-19: 1 via ORAL
  Filled 2020-12-19: qty 1

## 2020-12-19 MED ORDER — HYDROCODONE-ACETAMINOPHEN 5-325 MG PO TABS
2.0000 | ORAL_TABLET | Freq: Four times a day (QID) | ORAL | 0 refills | Status: DC | PRN
Start: 2020-12-19 — End: 2021-06-24

## 2020-12-19 MED ORDER — FAMOTIDINE 20 MG PO TABS
20.0000 mg | ORAL_TABLET | Freq: Once | ORAL | Status: AC
  Administered 2020-12-19: 20 mg via ORAL
  Filled 2020-12-19: qty 1

## 2020-12-19 MED ORDER — METRONIDAZOLE 500 MG PO TABS
500.0000 mg | ORAL_TABLET | Freq: Once | ORAL | Status: AC
  Administered 2020-12-19: 500 mg via ORAL
  Filled 2020-12-19: qty 1

## 2020-12-19 NOTE — ED Notes (Signed)
States been having symptoms of upper abdominal pressure.  [facility] pain medicine has helped.

## 2020-12-19 NOTE — Discharge Instructions (Addendum)
We saw in the ER for abdominal pain.  CT scan confirms colitis.  Please take the antibiotics that are prescribed.  Return to the ER if your symptoms get worse.

## 2020-12-19 NOTE — ED Triage Notes (Signed)
Bright red rectal bleeding with low abd pain x 4 days. Reports he had vomiting the first day. States he has had "stomach problems for awhile" but he hasn't told anyone.

## 2020-12-19 NOTE — ED Notes (Signed)
Patient transported to CT 

## 2020-12-19 NOTE — ED Triage Notes (Signed)
Pt reports abdomina bloating and cramping on and off x 6 months; rectal bleeding x 5 days.

## 2020-12-19 NOTE — Discharge Instructions (Addendum)
[place] at [facility] Address: [address], [place], [place] [address] Phone: [phone]

## 2020-12-19 NOTE — ED Provider Notes (Signed)
[facility] EMERGENCY DEPT Provider Note   CSN: [id number] Arrival date & time: 12/19/20  [id number]     History Chief Complaint  Patient presents with   Abdominal Pain    Lee Mitchell is a 70 y.o. male.  HPI    70 year old male comes in with chief complaint of abdominal pain. Abd pain is generalized, but more severe in lower quadrants. Pain x 1 week, more severe now. Pt has had loose BM for the last few days and also some vomiting. Bloody stools now.Reports  dizziness. No fevers and no uri like symptoms.  Past Medical History:  Diagnosis Date   Chronic abdominal pain    Colon polyps    PNA (pneumonia)    Thyroid cancer [facility])     Patient Active Problem List   Diagnosis Date Noted   Pain in right knee 03/24/2020   Coronary artery disease involving native coronary artery of native heart without angina pectoris 04/25/2019   Other spondylosis with radiculopathy, lumbar region 03/13/2019   Pre-op evaluation 01/19/2019   Thallium stress test abnormal 12/22/2018   DOE (dyspnea on exertion) 12/14/2018   Abnormal electrocardiogram (ECG) (EKG) 12/14/2018   Abnormal electrocardiogram 12/14/2018   Deep vein thrombosis (DVT) of calf muscle vein of left lower extremity ([place]) 12/14/2018   Encounter for screening for HIV 07/12/2018   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[place]) 07/06/2018   Drug-seeking behavior 05/21/2018   Opiates and related narcotics causing adverse effect in therapeutic use, initial encounter 05/21/2018   Left rotator cuff tear 05/18/2018   Cannabis abuse 04/29/2018   Postoperative hypothyroidism 04/26/2018   Type II diabetes mellitus with manifestations ([place]) 04/26/2018   Hyperlipidemia LDL goal <100 04/26/2018   Routine general medical examination at a health care facility 04/26/2018   Primary osteoarthritis involving multiple joints 04/26/2018   PSA elevation 04/26/2018   Hypertension 04/26/2018   Benign prostatic hyperplasia without lower urinary  tract symptoms 04/26/2018   Encounter for long-term opiate analgesic use 04/26/2018   Colon cancer screening 04/26/2018   Centrilobular emphysema ([place]) 04/03/2018   Parapneumonic effusion 11/21/2017   Pneumonia 11/16/2017   Degeneration of lumbar intervertebral disc 10/06/2017   Arthralgia of both knees 09/21/2017   Tobacco use disorder, moderate, dependence 03/28/2017   MDD (major depressive disorder), recurrent episode, moderate ([place]) 03/27/2017   MDD (major depressive disorder), recurrent episode, severe ([place]) 02/15/2017   Chronic pain syndrome 02/15/2017   Body mass index (BMI) 25.0-25.9, adult 04/25/2015   ADD (attention deficit disorder) 09/25/2013   Malignant neoplasm of thyroid gland ([place]) 02/19/2013   Arthritis 10/18/2012   H/O shoulder surgery 10/18/2012   Shoulder pain 10/18/2012   Snoring 10/18/2012   GERD 03/22/2010    Past Surgical History:  Procedure Laterality Date   ANKLE ARTHROSCOPY WITH RECONSTRUCTION     BACK SURGERY     CHOLECYSTECTOMY     COLONOSCOPY     ELBOW ARTHROSCOPY WITH TENDON RECONSTRUCTION     SHOULDER ARTHROSCOPY WITH LABRAL REPAIR     THYROIDECTOMY     2004/2005   ULNAR NERVE TRANSPOSITION         Family History  Problem Relation Age of Onset   Kidney cancer Father    Hypertension Father    Hypertension Mother    Hyperthyroidism Mother    Cancer Mother 32       thyroid cancer   Thyroid cancer Maternal Uncle     Social History   Tobacco Use   Smoking status: Former  Packs/day: 0.25    Years: 35.00    Pack years: 8.75    Types: Cigarettes   Smokeless tobacco: Never  Vaping Use   Vaping Use: Never used  Substance Use Topics   Alcohol use: Yes    Comment: occ   Drug use: Not Currently    Types: Marijuana    Home Medications Prior to Admission medications   Medication Sig Start Date End Date Taking? Authorizing Provider  ciprofloxacin (CIPRO) 500 MG tablet Take 1 tablet (500 mg total) by mouth every 12 (twelve) hours.  12/19/20  Yes Varney Biles, MD  dicyclomine (BENTYL) 20 MG tablet Take 1 tablet (20 mg total) by mouth 2 (two) times daily. 12/19/20  Yes Varney Biles, MD  HYDROcodone-acetaminophen (NORCO/VICODIN) 5-325 MG tablet Take 2 tablets by mouth every 6 (six) hours as needed for severe pain. 12/19/20  Yes Varney Biles, MD  metroNIDAZOLE (FLAGYL) 500 MG tablet Take 1 tablet (500 mg total) by mouth 2 (two) times daily. 12/19/20  Yes Augustina Braddock, MD  ondansetron (ZOFRAN ODT) 8 MG disintegrating tablet Take 1 tablet (8 mg total) by mouth every 8 (eight) hours as needed for nausea. 12/19/20  Yes Varney Biles, MD  albuterol (VENTOLIN HFA) 108 (90 Base) MCG/ACT inhaler Inhale 2 puffs into the lungs every 6 (six) hours as needed for wheezing or shortness of breath. 06/13/19 09/14/19  [provider]  azelastine (ASTELIN) 0.1 % nasal spray INHALE 1 SPRAY BY NASAL ROUTE 2 TIMES DAILY AS DIRECTED 08/20/19   [provider]  ezetimibe (ZETIA) 10 MG tablet Take by mouth. 08/01/19 07/31/20  [provider]  ibuprofen (ADVIL) 200 MG tablet Take 200 mg by mouth every 6 (six) hours as needed for headache or mild pain.    [provider]  levothyroxine (SYNTHROID) 175 MCG tablet TAKE 1 TABLET (175 MCG TOTAL) BY MOUTH DAILY BEFORE BREAKFAST. 04/07/19   [name], MD  meloxicam (MOBIC) 15 MG tablet Take 1 tablet (15 mg total) by mouth daily. 11/13/20 11/13/21  Trula Slade, DPM  metFORMIN (GLUCOPHAGE) 500 MG tablet Take 500 mg by mouth daily with breakfast. 04/04/19   [provider]  mirtazapine (REMERON SOL-TAB) 30 MG disintegrating tablet DISSOLVE 1 TABLET UNDER TONGUE AT BEDTIME Patient taking differently: Take 30 mg by mouth at bedtime. Dissolve 1 tablet ('30mg'$ ) under tongue at bedtime 04/22/19   [name], MD  omeprazole (PRILOSEC) 40 MG capsule TAKE 1 CAPSULE BY MOUTH TWICE A DAY Patient taking differently: Take 40 mg by mouth in the morning and at bedtime.   02/06/19   [name], MD  rosuvastatin (CRESTOR) 40 MG tablet Take 40 mg by mouth at bedtime.  02/22/19   [provider]  umeclidinium-vilanterol (ANORO ELLIPTA) 62.5-25 MCG/INH AEPB Inhale 1 puff into the lungs daily. Patient not taking: Reported on 07/12/2019 04/03/18   Rigoberto Noel, MD    Allergies    Patient has no known allergies.  Review of Systems   Review of Systems  Constitutional:  Positive for activity change and diaphoresis.  Gastrointestinal:  Positive for abdominal pain, diarrhea, nausea and vomiting.  Allergic/Immunologic: Negative for immunocompromised state.  Neurological:  Positive for dizziness.  Hematological:  Does not bruise/bleed easily.  All other systems reviewed and are negative.  Physical Exam Updated Vital Signs BP (!) 119/94 (BP Location: Right Arm)   Pulse 64   Temp 98.3 F (36.8 C) (Oral)   Resp 18   Ht '6\' 1"'$  (1.854 m)  Wt 74.8 kg   SpO2 99%   BMI 21.77 kg/m   Physical Exam Vitals and nursing note reviewed.  Constitutional:      Appearance: He is well-developed.  HENT:     Head: Atraumatic.  Cardiovascular:     Rate and Rhythm: Normal rate.  Pulmonary:     Effort: Pulmonary effort is normal.  Abdominal:     Tenderness: There is generalized abdominal tenderness. There is no guarding or rebound.  Musculoskeletal:     Cervical back: Neck supple.  Skin:    General: Skin is warm.  Neurological:     Mental Status: He is alert and oriented to person, place, and time.    ED Results / Procedures / Treatments   Labs (all labs ordered are listed, but only abnormal results are displayed) Labs Reviewed  COMPREHENSIVE METABOLIC PANEL - Abnormal; Notable for the following components:      Result Value   Glucose, Bld 119 (*)    All other components within normal limits  CBC - Abnormal; Notable for the following components:   WBC 12.4 (*)    All other components within normal limits  URINALYSIS, ROUTINE W REFLEX MICROSCOPIC  - Abnormal; Notable for the following components:   Protein, ur 30 (*)    Leukocytes,Ua SMALL (*)    Bacteria, UA FEW (*)    All other components within normal limits  LIPASE, BLOOD    EKG None  Radiology CT ABDOMEN PELVIS W CONTRAST  Result Date: 12/19/2020 CLINICAL DATA:  Evaluate for diverticulitis. Bright red rectal bleeding with lower abdominal pain for 4 days. EXAM: CT ABDOMEN AND PELVIS WITH CONTRAST TECHNIQUE: Multidetector CT imaging of the abdomen and pelvis was performed using the standard protocol following bolus administration of intravenous contrast. CONTRAST:  69m OMNIPAQUE IOHEXOL 350 MG/ML SOLN COMPARISON:  03/18/2019 FINDINGS: Lower chest: Scarring identified within the right middle lobe and posterior right base. No acute abnormality. Hepatobiliary: Multifocal liver lesions are again noted which are most compatible with benign hemangiomas. These are unchanged from previous exam. No acute or suspicious liver abnormality. Cholecystectomy. Mild increase caliber of the common bile duct and intrahepatic ducts likely reflect post cholecystectomy physiology. Pancreas: Unremarkable. No pancreatic ductal dilatation or surrounding inflammatory changes. Spleen: Normal in size without focal abnormality. Adrenals/Urinary Tract: Normal adrenal glands. Simple appearing cyst within the medial cortex of the upper pole of right kidney measure up to 9 mm. No mass or hydronephrosis. Urinary bladder is unremarkable. Stomach/Bowel: Stomach appears normal. The appendix is visualized and appears normal. Sigmoid diverticulosis noted. There is diffuse colonic wall thickening from the mid ascending colon to the rectum compatible with colitis. No signs of bowel perforation, pneumatosis or bowel obstruction. Vascular/Lymphatic: Aortic atherosclerosis. No abdominopelvic adenopathy. Reproductive: Prostate gland is enlarged and has mass effect upon the bladder base. Other: No free fluid or fluid collections.  Musculoskeletal: Postop change from L3 through S1 posterior hardware fixation. No acute or suspicious osseous findings. IMPRESSION: 1. Examination is positive for diffuse colonic wall thickening from the mid ascending colon to the rectum compatible with colitis. No signs of bowel perforation, pneumatosis or bowel obstruction. 2. Aortic atherosclerosis. 3. Enlarged prostate gland. Aortic Atherosclerosis (ICD10-I70.0). Electronically Signed   By: TKerby MoorsM.D.   On: 12/19/2020 12:44    Procedures Procedures   Medications Ordered in ED Medications  morphine 4 MG/ML injection 6 mg (6 mg Intravenous Given 12/19/20 1107)  sodium chloride 0.9 % bolus 1,000 mL (1,000 mLs Intravenous New Bag/Given 12/19/20  1106)  iohexol (OMNIPAQUE) 350 MG/ML injection 80 mL (80 mLs Intravenous Contrast Given 12/19/20 1138)  ciprofloxacin (CIPRO) tablet 500 mg (500 mg Oral Given 12/19/20 1419)  metroNIDAZOLE (FLAGYL) tablet 500 mg (500 mg Oral Given 12/19/20 1419)  famotidine (PEPCID) tablet 20 mg (20 mg Oral Given 12/19/20 1418)  oxyCODONE-acetaminophen (PERCOCET/ROXICET) 5-325 MG per tablet 1 tablet (1 tablet Oral Given 12/19/20 1418)    ED Course  I have reviewed the triage vital signs and the nursing notes.  Pertinent labs & imaging results that were available during my care of the patient were reviewed by me and considered in my medical decision making (see chart for details).    MDM Rules/Calculators/A&P                           Pt with abd pain - worsening. Also now having BRBPR and n/v.  CT ordered - concerns for intra-abd abscess, contained perforation. Also eval to see if colitis vs. Diverticulosis.  Reports chronic abd issues, but this feels different and not improving with time.   Reassessment: Results discussed with the patient - there is colitis. Will start antibiotics. Po challenge passed. Strict ER return precautions have been discussed, and patient is agreeing with the plan and is comfortable  with the workup done and the recommendations from the ER.   Final Clinical Impression(s) / ED Diagnoses Final diagnoses:  Colitis    Rx / DC Orders ED Discharge Orders          Ordered    ciprofloxacin (CIPRO) 500 MG tablet  Every 12 hours        12/19/20 1433    metroNIDAZOLE (FLAGYL) 500 MG tablet  2 times daily        12/19/20 1433    dicyclomine (BENTYL) 20 MG tablet  2 times daily        12/19/20 1433    ondansetron (ZOFRAN ODT) 8 MG disintegrating tablet  Every 8 hours PRN        12/19/20 1433    HYDROcodone-acetaminophen (NORCO/VICODIN) 5-325 MG tablet  Every 6 hours PRN        12/19/20 [place], [place], MD 12/25/20 [phone]

## 2020-12-23 NOTE — ED Provider Notes (Signed)
[facility]    CSN: [id number] Arrival date & time: 12/19/20  [id number]      History   Chief Complaint Chief Complaint  Patient presents with   Abdominal Pain   Rectal Bleeding    HPI Lee Mitchell is a 70 y.o. male.   Patient presenting today with about 6 months of noticeable abdominal bloating, distention and stool changes. Now having 5 days of stool caliber changes, bright red blood per rectum, and significant LLQ abdominal pain worse with movement. Does also believe he has been losing weight without efforts toward this. Last Colonoscopy 2020 per chart review, hx of colon polyps. Not trying anything OTC for sxs. Patient very concerned about sxs and particularly wanting to r/o colon cancer. Denies fever, chills, new medications or diet changes, sick contacts.    Past Medical History:  Diagnosis Date   Chronic abdominal pain    Colon polyps    PNA (pneumonia)    Thyroid cancer [facility])     Patient Active Problem List   Diagnosis Date Noted   Pain in right knee 03/24/2020   Coronary artery disease involving native coronary artery of native heart without angina pectoris 04/25/2019   Other spondylosis with radiculopathy, lumbar region 03/13/2019   Pre-op evaluation 01/19/2019   Thallium stress test abnormal 12/22/2018   DOE (dyspnea on exertion) 12/14/2018   Abnormal electrocardiogram (ECG) (EKG) 12/14/2018   Abnormal electrocardiogram 12/14/2018   Deep vein thrombosis (DVT) of calf muscle vein of left lower extremity ([place]) 12/14/2018   Encounter for screening for HIV 07/12/2018   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[place]) 07/06/2018   Drug-seeking behavior 05/21/2018   Opiates and related narcotics causing adverse effect in therapeutic use, initial encounter 05/21/2018   Left rotator cuff tear 05/18/2018   Cannabis abuse 04/29/2018   Postoperative hypothyroidism 04/26/2018   Type II diabetes mellitus with manifestations ([place]) 04/26/2018   Hyperlipidemia LDL goal <100  04/26/2018   Routine general medical examination at a health care facility 04/26/2018   Primary osteoarthritis involving multiple joints 04/26/2018   PSA elevation 04/26/2018   Hypertension 04/26/2018   Benign prostatic hyperplasia without lower urinary tract symptoms 04/26/2018   Encounter for long-term opiate analgesic use 04/26/2018   Colon cancer screening 04/26/2018   Centrilobular emphysema ([place]) 04/03/2018   Parapneumonic effusion 11/21/2017   Pneumonia 11/16/2017   Degeneration of lumbar intervertebral disc 10/06/2017   Arthralgia of both knees 09/21/2017   Tobacco use disorder, moderate, dependence 03/28/2017   MDD (major depressive disorder), recurrent episode, moderate ([place]) 03/27/2017   MDD (major depressive disorder), recurrent episode, severe ([place]) 02/15/2017   Chronic pain syndrome 02/15/2017   Body mass index (BMI) 25.0-25.9, adult 04/25/2015   ADD (attention deficit disorder) 09/25/2013   Malignant neoplasm of thyroid gland ([place]) 02/19/2013   Arthritis 10/18/2012   H/O shoulder surgery 10/18/2012   Shoulder pain 10/18/2012   Snoring 10/18/2012   GERD 03/22/2010    Past Surgical History:  Procedure Laterality Date   ANKLE ARTHROSCOPY WITH RECONSTRUCTION     BACK SURGERY     CHOLECYSTECTOMY     COLONOSCOPY     ELBOW ARTHROSCOPY WITH TENDON RECONSTRUCTION     SHOULDER ARTHROSCOPY WITH LABRAL REPAIR     THYROIDECTOMY     2004/2005   ULNAR NERVE TRANSPOSITION         Home Medications    Prior to Admission medications   Medication Sig Start Date End Date Taking? Authorizing Provider  albuterol (VENTOLIN HFA) 108 (90  Base) MCG/ACT inhaler Inhale 2 puffs into the lungs every 6 (six) hours as needed for wheezing or shortness of breath. 06/13/19 09/14/19  [provider]  azelastine (ASTELIN) 0.1 % nasal spray INHALE 1 SPRAY BY NASAL ROUTE 2 TIMES DAILY AS DIRECTED 08/20/19   [provider]  ciprofloxacin (CIPRO) 500 MG tablet Take 1 tablet (500  mg total) by mouth every 12 (twelve) hours. 12/19/20   Varney Biles, MD  dicyclomine (BENTYL) 20 MG tablet Take 1 tablet (20 mg total) by mouth 2 (two) times daily. 12/19/20   Varney Biles, MD  ezetimibe (ZETIA) 10 MG tablet Take by mouth. 08/01/19 07/31/20  [provider]  HYDROcodone-acetaminophen (NORCO/VICODIN) 5-325 MG tablet Take 2 tablets by mouth every 6 (six) hours as needed for severe pain. 12/19/20   Varney Biles, MD  ibuprofen (ADVIL) 200 MG tablet Take 200 mg by mouth every 6 (six) hours as needed for headache or mild pain.    [provider]  levothyroxine (SYNTHROID) 175 MCG tablet TAKE 1 TABLET (175 MCG TOTAL) BY MOUTH DAILY BEFORE BREAKFAST. 04/07/19   [name], MD  meloxicam (MOBIC) 15 MG tablet Take 1 tablet (15 mg total) by mouth daily. 11/13/20 11/13/21  Trula Slade, DPM  metFORMIN (GLUCOPHAGE) 500 MG tablet Take 500 mg by mouth daily with breakfast. 04/04/19   [provider]  metroNIDAZOLE (FLAGYL) 500 MG tablet Take 1 tablet (500 mg total) by mouth 2 (two) times daily. 12/19/20   Varney Biles, MD  mirtazapine (REMERON SOL-TAB) 30 MG disintegrating tablet DISSOLVE 1 TABLET UNDER TONGUE AT BEDTIME Patient taking differently: Take 30 mg by mouth at bedtime. Dissolve 1 tablet ('30mg'$ ) under tongue at bedtime 04/22/19   [name], MD  omeprazole (PRILOSEC) 40 MG capsule TAKE 1 CAPSULE BY MOUTH TWICE A DAY Patient taking differently: Take 40 mg by mouth in the morning and at bedtime.  02/06/19   [name], MD  ondansetron (ZOFRAN ODT) 8 MG disintegrating tablet Take 1 tablet (8 mg total) by mouth every 8 (eight) hours as needed for nausea. 12/19/20   Varney Biles, MD  rosuvastatin (CRESTOR) 40 MG tablet Take 40 mg by mouth at bedtime.  02/22/19   [provider]  umeclidinium-vilanterol (ANORO ELLIPTA) 62.5-25 MCG/INH AEPB Inhale 1 puff into the lungs daily. Patient not taking: Reported on 07/12/2019 04/03/18   Rigoberto Noel, MD    Family History Family History  Problem Relation Age of Onset   Kidney cancer Father    Hypertension Father    Hypertension Mother    Hyperthyroidism Mother    Cancer Mother 6       thyroid cancer   Thyroid cancer Maternal Uncle     Social History Social History   Tobacco Use   Smoking status: Former    Packs/day: 0.25    Years: 35.00    Pack years: 8.75    Types: Cigarettes   Smokeless tobacco: Never  Vaping Use   Vaping Use: Never used  Substance Use Topics   Alcohol use: Yes    Comment: occ   Drug use: Not Currently    Types: Marijuana     Allergies   Patient has no known allergies.   Review of Systems Review of Systems PER HPI   Physical Exam Triage Vital Signs ED Triage Vitals  Enc Vitals Group     BP 12/19/20 0853 118/74     Pulse Rate 12/19/20 0853 90  Resp --      Temp 12/19/20 0853 98.2 F (36.8 C)     Temp Source 12/19/20 0853 Oral     SpO2 12/19/20 0853 96 %     Weight --      Height --      Head Circumference --      Peak Flow --      Pain Score 12/19/20 0854 5     Pain Loc --      Pain Edu? --      Excl. in [place]? --    No data found.  Updated Vital Signs BP 118/74 (BP Location: Left Arm)   Pulse 90   Temp 98.2 F (36.8 C) (Oral)   SpO2 96%   Visual Acuity Right Eye Distance:   Left Eye Distance:   Bilateral Distance:    Right Eye Near:   Left Eye Near:    Bilateral Near:     Physical Exam Vitals and nursing note reviewed.  Constitutional:      Appearance: Normal appearance. He is well-developed. He is not ill-appearing.  HENT:     Head: Atraumatic.     Mouth/Throat:     Mouth: Mucous membranes are moist.  Eyes:     Extraocular Movements: Extraocular movements intact.     Conjunctiva/sclera: Conjunctivae normal.  Cardiovascular:     Rate and Rhythm: Normal rate and regular rhythm.     Heart sounds: Normal heart sounds.  Pulmonary:     Effort: Pulmonary effort is normal.     Breath sounds: Normal  breath sounds.  Abdominal:     General: Bowel sounds are normal. There is no distension.     Palpations: Abdomen is soft.     Tenderness: There is abdominal tenderness. There is guarding. There is no right CVA tenderness or left CVA tenderness.     Comments: Significant ttp LLQ with guarding. No distention  Musculoskeletal:        General: Normal range of motion.     Cervical back: Normal range of motion and neck supple.  Skin:    General: Skin is warm and dry.  Neurological:     General: No focal deficit present.     Mental Status: He is alert and oriented to person, place, and time.  Psychiatric:        Mood and Affect: Mood normal.        Thought Content: Thought content normal.        Judgment: Judgment normal.     UC Treatments / Results  Labs (all labs ordered are listed, but only abnormal results are displayed) Labs Reviewed - No data to display  EKG   Radiology No results found.  Procedures Procedures (including critical care time)  Medications Ordered in UC Medications - No data to display  Initial Impression / Assessment and Plan / UC Course  I have reviewed the triage vital signs and the nursing notes.  Pertinent labs & imaging results that were available during my care of the patient were reviewed by me and considered in my medical decision making (see chart for details).     Vital signs stable and reassuring, exam with significant abdominal tenderness and given concerning nature of sxs recommend he go to ED for further evaluation as he is unable to schedule with his PCP soon. He is very agreeable to this plan, wishes to go via private vehicle to ED. He is hemodynamically stable for transport at this time.   Final  Clinical Impressions(s) / UC Diagnoses   Final diagnoses:  Bright red blood per rectum  Lower abdominal pain  Change in stool caliber  Abdominal bloating     Discharge Instructions      [facility] [place]: [address], [place], [place] [address] Phone: [phone]     ED Prescriptions   None    PDMP not reviewed this encounter.   Volney American, [place] 12/23/20 [id number]

## 2021-01-13 ENCOUNTER — Encounter: Payer: Self-pay | Admitting: Podiatry

## 2021-01-19 ENCOUNTER — Ambulatory Visit: Payer: Medicare Other | Admitting: Podiatry

## 2021-01-29 ENCOUNTER — Ambulatory Visit: Payer: Medicare Other | Admitting: Podiatry

## 2021-02-02 ENCOUNTER — Ambulatory Visit: Payer: Medicare Other | Admitting: Podiatry

## 2021-02-16 ENCOUNTER — Other Ambulatory Visit: Payer: Self-pay | Admitting: Podiatry

## 2021-02-16 ENCOUNTER — Ambulatory Visit: Payer: Medicare Other | Admitting: Podiatry

## 2021-02-16 ENCOUNTER — Other Ambulatory Visit: Payer: Self-pay

## 2021-02-16 ENCOUNTER — Encounter: Payer: Self-pay | Admitting: Podiatry

## 2021-02-16 DIAGNOSIS — M7989 Other specified soft tissue disorders: Secondary | ICD-10-CM

## 2021-02-16 DIAGNOSIS — M19072 Primary osteoarthritis, left ankle and foot: Secondary | ICD-10-CM

## 2021-02-16 MED ORDER — DICLOFENAC SODIUM 1 % EX GEL: 2.0000 g | Freq: Four times a day (QID) | CUTANEOUS | 2 refills | Status: AC

## 2021-02-17 MED ORDER — TRAMADOL HCL 50 MG PO TABS: 50.0000 mg | ORAL_TABLET | Freq: Three times a day (TID) | ORAL | 0 refills | Status: AC | PRN

## 2021-02-18 NOTE — Progress Notes (Signed)
Subjective: 70 year old male presents the office for follow evaluation of right foot pain, left ankle pain.  He states he still has a tender area of pain on his right foot.  He states the pain is intermittent but he stepped the wrong way causing discomfort.  Wants to discuss a possible injection and other treatment options.  No recent injury or trauma.  He states that the left ankle does "catch" at times causing discomfort but no recent injury.  No other changes.  Objective: AAO x3, NAD DP/PT pulses palpable bilaterally, CRT less than 3 seconds Today on the plantar aspect of right foot just proximal to the metatarsal heads there is 1 localized area of firm nodule present.  This is where he gets tenderness.  No edema, erythema.  No there is discomfort in the right side.  No significant discomfort of the left ankle today. No edema, erythema, increase in warmth to bilateral lower extremities.  No open lesions or pre-ulcerative lesions.  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  -We discussed steroid injection but ultimately held off on this today.  He is going to start with Voltaren gel that he is getting used to that he already has.  We will order compound cream today for scarring through [employer].  If needed consider steroid injection. -Patient encouraged to call the office with any questions, concerns, change in symptoms.   Trula Slade DPM

## 2021-04-15 ENCOUNTER — Emergency Department (HOSPITAL_BASED_OUTPATIENT_CLINIC_OR_DEPARTMENT_OTHER): Payer: Medicare Other

## 2021-04-15 ENCOUNTER — Other Ambulatory Visit: Payer: Self-pay

## 2021-04-15 ENCOUNTER — Emergency Department (HOSPITAL_BASED_OUTPATIENT_CLINIC_OR_DEPARTMENT_OTHER): Payer: Medicare Other | Admitting: Radiology

## 2021-04-15 ENCOUNTER — Encounter (HOSPITAL_BASED_OUTPATIENT_CLINIC_OR_DEPARTMENT_OTHER): Payer: Self-pay | Admitting: *Deleted

## 2021-04-15 ENCOUNTER — Emergency Department (HOSPITAL_BASED_OUTPATIENT_CLINIC_OR_DEPARTMENT_OTHER)
Admission: EM | Admit: 2021-04-15 | Discharge: 2021-04-15 | Disposition: A | Discharge status: Home / Self Care | Payer: Medicare Other | Attending: Emergency Medicine | Admitting: Emergency Medicine

## 2021-04-15 DIAGNOSIS — R55 Syncope and collapse: Secondary | ICD-10-CM | POA: Insufficient documentation

## 2021-04-15 DIAGNOSIS — Z85850 Personal history of malignant neoplasm of thyroid: Secondary | ICD-10-CM | POA: Diagnosis not present

## 2021-04-15 DIAGNOSIS — W19XXXA Unspecified fall, initial encounter: Secondary | ICD-10-CM

## 2021-04-15 DIAGNOSIS — Z79899 Other long term (current) drug therapy: Secondary | ICD-10-CM | POA: Insufficient documentation

## 2021-04-15 DIAGNOSIS — F17210 Nicotine dependence, cigarettes, uncomplicated: Secondary | ICD-10-CM | POA: Insufficient documentation

## 2021-04-15 DIAGNOSIS — M25571 Pain in right ankle and joints of right foot: Secondary | ICD-10-CM | POA: Diagnosis not present

## 2021-04-15 DIAGNOSIS — R42 Dizziness and giddiness: Secondary | ICD-10-CM | POA: Insufficient documentation

## 2021-04-15 DIAGNOSIS — Z7984 Long term (current) use of oral hypoglycemic drugs: Secondary | ICD-10-CM | POA: Diagnosis not present

## 2021-04-15 DIAGNOSIS — M545 Low back pain, unspecified: Secondary | ICD-10-CM | POA: Diagnosis present

## 2021-04-15 DIAGNOSIS — I10 Essential (primary) hypertension: Secondary | ICD-10-CM | POA: Diagnosis not present

## 2021-04-15 DIAGNOSIS — W06XXXA Fall from bed, initial encounter: Secondary | ICD-10-CM | POA: Diagnosis not present

## 2021-04-15 DIAGNOSIS — M5441 Lumbago with sciatica, right side: Secondary | ICD-10-CM | POA: Diagnosis not present

## 2021-04-15 DIAGNOSIS — E1129 Type 2 diabetes mellitus with other diabetic kidney complication: Secondary | ICD-10-CM | POA: Diagnosis not present

## 2021-04-15 DIAGNOSIS — I2510 Atherosclerotic heart disease of native coronary artery without angina pectoris: Secondary | ICD-10-CM | POA: Insufficient documentation

## 2021-04-15 LAB — CBC WITH DIFFERENTIAL/PLATELET
Abs Immature Granulocytes: 0.02 10*3/uL (ref 0.00–0.07)
Basophils Absolute: 0 10*3/uL (ref 0.0–0.1)
Basophils Relative: 0 %
Eosinophils Absolute: 0.1 10*3/uL (ref 0.0–0.5)
Eosinophils Relative: 1 %
HCT: 37.3 % — ABNORMAL LOW (ref 39.0–52.0)
Hemoglobin: 12.5 g/dL — ABNORMAL LOW (ref 13.0–17.0)
Immature Granulocytes: 0 %
Lymphocytes Relative: 14 %
Lymphs Abs: 1.6 10*3/uL (ref 0.7–4.0)
MCH: 29.6 pg (ref 26.0–34.0)
MCHC: 33.5 g/dL (ref 30.0–36.0)
MCV: 88.2 fL (ref 80.0–100.0)
Monocytes Absolute: 0.8 10*3/uL (ref 0.1–1.0)
Monocytes Relative: 7 %
Neutro Abs: 8.8 10*3/uL — ABNORMAL HIGH (ref 1.7–7.7)
Neutrophils Relative %: 78 %
Platelets: 257 10*3/uL (ref 150–400)
RBC: 4.23 MIL/uL (ref 4.22–5.81)
RDW: 14.3 % (ref 11.5–15.5)
WBC: 11.4 10*3/uL — ABNORMAL HIGH (ref 4.0–10.5)
nRBC: 0 % (ref 0.0–0.2)

## 2021-04-15 LAB — BASIC METABOLIC PANEL
Anion gap: 10 (ref 5–15)
BUN: 19 mg/dL (ref 8–23)
CO2: 24 mmol/L (ref 22–32)
Calcium: 8.6 mg/dL — ABNORMAL LOW (ref 8.9–10.3)
Chloride: 106 mmol/L (ref 98–111)
Creatinine, Ser: 1.04 mg/dL (ref 0.61–1.24)
GFR, Estimated: >60 mL/min (ref >60)
Glucose, Bld: 83 mg/dL (ref 70–99)
Potassium: 4 mmol/L (ref 3.5–5.1)
Sodium: 140 mmol/L (ref 135–145)

## 2021-04-15 LAB — TROPONIN I (HIGH SENSITIVITY): Troponin I (High Sensitivity): <2 ng/L (ref <18)

## 2021-04-15 MED ORDER — DEXAMETHASONE SODIUM PHOSPHATE 10 MG/ML IJ SOLN
10.0000 mg | Freq: Once | INTRAMUSCULAR | Status: AC
  Administered 2021-04-15: 16:00:00 10 mg via INTRAVENOUS
  Filled 2021-04-15: qty 1

## 2021-04-15 MED ORDER — METHOCARBAMOL 500 MG PO TABS: 500.0000 mg | ORAL_TABLET | Freq: Two times a day (BID) | ORAL | 0 refills | Status: DC

## 2021-04-15 MED ORDER — LIDOCAINE 5 % EX PTCH: 1.0000 | MEDICATED_PATCH | CUTANEOUS | 0 refills | Status: DC

## 2021-04-15 MED ORDER — PREDNISONE 50 MG PO TABS: 50.0000 mg | ORAL_TABLET | Freq: Every day | ORAL | 0 refills | Status: DC

## 2021-04-15 MED ORDER — FENTANYL CITRATE PF 50 MCG/ML IJ SOSY
50.0000 ug | PREFILLED_SYRINGE | Freq: Once | INTRAMUSCULAR | Status: AC
  Administered 2021-04-15: 16:00:00 50 ug via INTRAVENOUS
  Filled 2021-04-15: qty 1

## 2021-04-15 MED ORDER — OXYCODONE HCL 5 MG PO TABS: 5.0000 mg | ORAL_TABLET | ORAL | 0 refills | Status: AC | PRN

## 2021-04-15 MED ORDER — SODIUM CHLORIDE 0.9 % IV BOLUS
1000.0000 mL | Freq: Once | INTRAVENOUS | Status: AC
  Administered 2021-04-15: 17:00:00 1000 mL via INTRAVENOUS

## 2021-04-15 MED ORDER — HYDROMORPHONE HCL 1 MG/ML IJ SOLN
1.0000 mg | Freq: Once | INTRAMUSCULAR | Status: AC
  Administered 2021-04-15: 18:00:00 1 mg via INTRAVENOUS
  Filled 2021-04-15: qty 1

## 2021-04-15 MED ORDER — METHOCARBAMOL 500 MG PO TABS
500.0000 mg | ORAL_TABLET | Freq: Once | ORAL | Status: AC
  Administered 2021-04-15: 16:00:00 500 mg via ORAL
  Filled 2021-04-15: qty 1

## 2021-04-15 NOTE — Discharge Instructions (Signed)
Take the medications as prescribed  Return for new or worsening symptoms 

## 2021-04-15 NOTE — ED Triage Notes (Signed)
Pt has chronic back and leg pain, Rt knee and leg pain worse after falling last night, ? Syncopal episode causing the fall.

## 2021-04-15 NOTE — ED Provider Notes (Signed)
Lee Mitchell   CSN: [id number] Arrival date & time: 04/15/21  1207     History Chief Complaint  Patient presents with   Back Pain   Hip Pain   Knee Pain    Lee Mitchell is a 70 y.o. male with past medical history significant for cluster B personality disorder, neck pain, prior DVT, diabetes who presents for evaluation of fall vs syncope.  Patient states he went to get up yesterday from the bed.  Once he stood he felt lightheaded, dizzy, sweaty and the next thing he knew he was on the ground with his right leg in an awkward position. States he could "feel myself going out " however states he recalls the entire incident. He feels like he twisted his back and his leg.  He has pain to his lower back, right gluteus, right knee and right ankle.  Initially had some numbness to his right leg which has resolved.  Pain worse with ambulation.  He denies hitting his head, LOC or anticoagulation.  Pain primarily starts at back and extends down his leg.  No circumferential swelling, ecchymosis.  Rates his pain a 10/10.  Has had prior lumbar surgery many years ago.  States similar pain now to when he needed back surgery.  No fever, IV drug use, bowel or bladder incontinence, saddle paresthesia.  No sudden onset headache, chest pain, shortness of breath prior to syncope.  Denies additional aggravating or alleviating factors.  History obtained from patient and past medical records.  No interpreter used.  HPI     Past Medical History:  Diagnosis Date   Chronic abdominal pain    Colon polyps    PNA (pneumonia)    Thyroid cancer [facility])     Patient Active Problem List   Diagnosis Date Noted   Pain in right knee 03/24/2020   Coronary artery disease involving native coronary artery of native heart without angina pectoris 04/25/2019   Other spondylosis with radiculopathy, lumbar region 03/13/2019   Pre-op evaluation 01/19/2019   Thallium stress test  abnormal 12/22/2018   DOE (dyspnea on exertion) 12/14/2018   Abnormal electrocardiogram (ECG) (EKG) 12/14/2018   Abnormal electrocardiogram 12/14/2018   Deep vein thrombosis (DVT) of calf muscle vein of left lower extremity ([place]) 12/14/2018   Encounter for screening for HIV 07/12/2018   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[place]) 07/06/2018   Drug-seeking behavior 05/21/2018   Opiates and related narcotics causing adverse effect in therapeutic use, initial encounter 05/21/2018   Left rotator cuff tear 05/18/2018   Cannabis abuse 04/29/2018   Postoperative hypothyroidism 04/26/2018   Type II diabetes mellitus with manifestations ([place]) 04/26/2018   Hyperlipidemia LDL goal <100 04/26/2018   Routine general medical examination at a health care facility 04/26/2018   Primary osteoarthritis involving multiple joints 04/26/2018   PSA elevation 04/26/2018   Hypertension 04/26/2018   Benign prostatic hyperplasia without lower urinary tract symptoms 04/26/2018   Encounter for long-term opiate analgesic use 04/26/2018   Colon cancer screening 04/26/2018   Centrilobular emphysema ([place]) 04/03/2018   Parapneumonic effusion 11/21/2017   Pneumonia 11/16/2017   Degeneration of lumbar intervertebral disc 10/06/2017   Arthralgia of both knees 09/21/2017   Tobacco use disorder, moderate, dependence 03/28/2017   MDD (major depressive disorder), recurrent episode, moderate ([place]) 03/27/2017   MDD (major depressive disorder), recurrent episode, severe ([place]) 02/15/2017   Chronic pain syndrome 02/15/2017   Body mass index (BMI) 25.0-25.9, adult 04/25/2015   ADD (attention deficit disorder) 09/25/2013  Malignant neoplasm of thyroid gland ([place]) 02/19/2013   Arthritis 10/18/2012   H/O shoulder surgery 10/18/2012   Shoulder pain 10/18/2012   Snoring 10/18/2012   GERD 03/22/2010    Past Surgical History:  Procedure Laterality Date   ANKLE ARTHROSCOPY WITH RECONSTRUCTION     BACK SURGERY     CHOLECYSTECTOMY      COLONOSCOPY     ELBOW ARTHROSCOPY WITH TENDON RECONSTRUCTION     SHOULDER ARTHROSCOPY WITH LABRAL REPAIR     THYROIDECTOMY     2004/2005   ULNAR NERVE TRANSPOSITION         Family History  Problem Relation Age of Onset   Kidney cancer Father    Hypertension Father    Hypertension Mother    Hyperthyroidism Mother    Cancer Mother 80       thyroid cancer   Thyroid cancer Maternal Uncle     Social History   Tobacco Use   Smoking status: Every Day    Packs/day: 0.25    Years: 35.00    Pack years: 8.75    Types: Cigarettes   Smokeless tobacco: Never  Vaping Use   Vaping Use: Never used  Substance Use Topics   Alcohol use: Yes    Comment: occ   Drug use: Not Currently    Types: Marijuana    Home Medications Prior to Admission medications   Medication Sig Start Date End Date Taking? Authorizing Provider  lidocaine (LIDODERM) 5 % Place 1 patch onto the skin daily. Remove & Discard patch within 12 hours or as directed by MD 04/15/21  Yes Byanca Kasper A, PA-C  methocarbamol (ROBAXIN) 500 MG tablet Take 1 tablet (500 mg total) by mouth 2 (two) times daily. 04/15/21  Yes Carley Strickling A, PA-C  oxyCODONE (ROXICODONE) 5 MG immediate release tablet Take 1 tablet (5 mg total) by mouth every 4 (four) hours as needed for up to 3 days for severe pain. 04/15/21 04/18/21 Yes Kaela Beitz A, PA-C  predniSONE (DELTASONE) 50 MG tablet Take 1 tablet (50 mg total) by mouth daily. 04/15/21  Yes Alayza Pieper A, PA-C  albuterol (VENTOLIN HFA) 108 (90 Base) MCG/ACT inhaler Inhale 2 puffs into the lungs every 6 (six) hours as needed for wheezing or shortness of breath. 06/13/19 09/14/19  [provider]  azelastine (ASTELIN) 0.1 % nasal spray INHALE 1 SPRAY BY NASAL ROUTE 2 TIMES DAILY AS DIRECTED 08/20/19   [provider]  ciprofloxacin (CIPRO) 500 MG tablet Take 1 tablet (500 mg total) by mouth every 12 (twelve) hours. 12/19/20   Varney Biles, MD  diclofenac  Sodium (VOLTAREN) 1 % GEL Apply 2 g topically 4 (four) times daily. Rub into affected area of foot 2 to 4 times daily 02/16/21   Trula Slade, DPM  dicyclomine (BENTYL) 20 MG tablet Take 1 tablet (20 mg total) by mouth 2 (two) times daily. 12/19/20   Varney Biles, MD  ezetimibe (ZETIA) 10 MG tablet Take by mouth. 08/01/19 07/31/20  [provider]  HYDROcodone-acetaminophen (NORCO/VICODIN) 5-325 MG tablet Take 2 tablets by mouth every 6 (six) hours as needed for severe pain. 12/19/20   Varney Biles, MD  ibuprofen (ADVIL) 200 MG tablet Take 200 mg by mouth every 6 (six) hours as needed for headache or mild pain.    [provider]  levothyroxine (SYNTHROID) 150 MCG tablet Take 150 mcg by mouth daily. 01/07/21   [provider]  levothyroxine (SYNTHROID) 175 MCG tablet TAKE 1 TABLET (175 MCG  TOTAL) BY MOUTH DAILY BEFORE BREAKFAST. 04/07/19   [name], MD  meloxicam (MOBIC) 15 MG tablet Take 1 tablet (15 mg total) by mouth daily. 11/13/20 11/13/21  Trula Slade, DPM  metFORMIN (GLUCOPHAGE) 500 MG tablet Take 500 mg by mouth daily with breakfast. 04/04/19   [provider]  metroNIDAZOLE (FLAGYL) 500 MG tablet Take 1 tablet (500 mg total) by mouth 2 (two) times daily. 12/19/20   Varney Biles, MD  mirtazapine (REMERON SOL-TAB) 30 MG disintegrating tablet DISSOLVE 1 TABLET UNDER TONGUE AT BEDTIME Patient taking differently: Take 30 mg by mouth at bedtime. Dissolve 1 tablet (30mg ) under tongue at bedtime 04/22/19   [name], MD  mirtazapine (REMERON) 30 MG tablet SMARTSIG:1 Tablet(s) By Mouth Every Evening 12/05/20   [provider]  [facility]  Wart cream-#14    [provider]  omeprazole (PRILOSEC) 40 MG capsule TAKE 1 CAPSULE BY MOUTH TWICE A DAY Patient taking differently: Take 40 mg by mouth in the morning and at bedtime.  02/06/19   [name], MD  ondansetron (ZOFRAN ODT) 8 MG disintegrating  tablet Take 1 tablet (8 mg total) by mouth every 8 (eight) hours as needed for nausea. 12/19/20   Varney Biles, MD  rosuvastatin (CRESTOR) 40 MG tablet Take 40 mg by mouth at bedtime.  02/22/19   [provider]  umeclidinium-vilanterol (ANORO ELLIPTA) 62.5-25 MCG/INH AEPB Inhale 1 puff into the lungs daily. Patient not taking: Reported on 07/12/2019 04/03/18   Rigoberto Noel, MD    Allergies    Patient has no known allergies.  Review of Systems   Review of Systems  Constitutional: Negative.   HENT: Negative.    Respiratory: Negative.    Cardiovascular: Negative.   Gastrointestinal: Negative.   Genitourinary: Negative.   Musculoskeletal:  Positive for back pain.       Right knee pain, right ankle pain, lower midline back pain, right hip pain  Neurological:  Positive for syncope. Negative for dizziness, tremors, seizures, facial asymmetry, speech difficulty, weakness, light-headedness, numbness and headaches.  All other systems reviewed and are negative.  Physical Exam Updated Vital Signs BP (!) 144/70    Pulse 87    Temp (!) 97.5 F (36.4 C)    Resp 20    Ht 6\' 2"  (1.88 m)    Wt 72.6 kg    SpO2 95%    BMI 20.54 kg/m   Physical Exam Physical Exam  Constitutional: Pt appears well-developed and well-nourished. No distress.  HENT:  Head: Normocephalic and atraumatic.  Mouth/Throat: Oropharynx is clear and moist. No oropharyngeal exudate.  Eyes: Conjunctivae are normal.  Neck: Normal range of motion. Neck supple.  Full ROM without pain  Cardiovascular: Normal rate, regular rhythm and intact distal pulses.   Pulmonary/Chest: Effort normal and breath sounds normal. No respiratory distress. Pt has no wheezes.  Abdominal: Soft. Pt exhibits no distension. There is no tenderness, rebound or guarding. No abd bruit or pulsatile mass Musculoskeletal:  Full range of motion of the T-spine and L-spine with flexion, hyperextension, and lateral flexion. No midline tenderness or  stepoffs. No tenderness to palpation of the spinous processes of the T-spine or L-spine. Mild tenderness to palpation of the paraspinous muscles of the L-spine R>L. Tenderness to right piriformis and SI region. Positive straight leg raise on right. Diffuse tenderness to anterior right knee. Non tender, femur, tib/fib Bl. Tenderness and swelling to right lateral malleolus. Able to plantar flex and dorsiflex  without difficulty. Wiggles toes. No shortening or rotation of legs. Lymphadenopathy:    Pt has no cervical adenopathy.  Neurological: Pt is alert. Pt has normal reflexes.  Reflex Scores:      Bicep reflexes are 2+ on the right side and 2+ on the left side.      Brachioradialis reflexes are 2+ on the right side and 2+ on the left side.      Patellar reflexes are 2+ on the right side and 2+ on the left side.      Achilles reflexes are 2+ on the right side and 2+ on the left side. Speech is clear and goal oriented, follows commands Normal 5/5 strength in upper and lower extremities bilaterally including dorsiflexion and plantar flexion, strong and equal grip strength Sensation normal to light and sharp touch Moves extremities without ataxia, coordination intact Abnormal gait secondary to pain with weight bearing Normal balance No Clonus Skin: Skin is warm and dry. No rash noted or lesions noted. Pt is not diaphoretic. No erythema, ecchymosis,edema or warmth.  Psychiatric: Pt has a normal mood and affect. Behavior is normal.  Nursing Mitchell and vitals reviewed.  ED Results / Procedures / Treatments   Labs (all labs ordered are listed, but only abnormal results are displayed) Labs Reviewed  CBC WITH DIFFERENTIAL/PLATELET - Abnormal; Notable for the following components:      Result Value   WBC 11.4 (*)    Hemoglobin 12.5 (*)    HCT 37.3 (*)    Neutro Abs 8.8 (*)    All other components within normal limits  BASIC METABOLIC PANEL - Abnormal; Notable for the following components:   Calcium  8.6 (*)    All other components within normal limits  TROPONIN I (HIGH SENSITIVITY)  TROPONIN I (HIGH SENSITIVITY)    EKG None  Radiology DG Ankle Complete Right  Result Date: 04/15/2021 CLINICAL DATA:  Fall, ankle pain EXAM: RIGHT ANKLE - COMPLETE 3+ VIEW COMPARISON:  None. FINDINGS: No acute fracture or dislocation identified. Ankle mortise is preserved. Tiny plantar calcaneal spur noted. Mild soft tissue swelling of the lateral ankle. IMPRESSION: No acute osseous abnormality identified. Electronically Signed   By: Ofilia Neas M.D.   On: 04/15/2021 13:42   CT Lumbar Spine Wo Contrast  Result Date: 04/15/2021 CLINICAL DATA:  Low back pain, recent fall EXAM: CT LUMBAR SPINE WITHOUT CONTRAST TECHNIQUE: Multidetector CT imaging of the lumbar spine was performed without intravenous contrast administration. Multiplanar CT image reconstructions were also generated. COMPARISON:  No prior CT of the lumbar spine, correlation is made with 09/14/2019 MRI lumbar spine and CT abdomen pelvis 12/19/2020. FINDINGS: Segmentation: 5 lumbar type vertebrae. Alignment: Straightening of the normal lumbar lordosis. Trace retrolisthesis L1 on L2 and L2 on L3, unchanged. Vertebrae: No acute fracture or suspicious osseous lesion. Status post posterior fusion L3-S1, with interbody disc spacers. No evidence of definite osseous fusion across the disc spaces. Beam hardening artifact from the hardware limits evaluation at these levels. Paraspinal and other soft tissues: Vascular calcifications. Infrarenal abdominal aortic ectasia, measuring up to 2.6 cm. Disc levels: T12-L1: No significant disc bulge. No spinal canal stenosis or neural foraminal narrowing. L1-L2: No significant disc bulge. Mild facet arthropathy. No spinal canal stenosis or neural foraminal narrowing. L2-L3: No significant disc bulge. Moderate facet arthropathy. No spinal canal stenosis or neural foraminal narrowing. L3-L4: Status post fusion and  decompression. No spinal canal stenosis. No definite neural foraminal narrowing. L4-L5: Status post fusion and decompression. No spinal canal  stenosis. No definite neural foraminal narrowing. L5-S1: Status post fusion and decompression. No spinal canal stenosis. Mild right neural foraminal narrowing. IMPRESSION: No acute fracture or traumatic listhesis in the lumbar spine. Overall unchanged appearance compared to the 12/19/2020 CT abdomen pelvis. Electronically Signed   By: Merilyn Baba M.D.   On: 04/15/2021 17:47   DG Knee Complete 4 Views Right  Result Date: 04/15/2021 CLINICAL DATA:  Fall, knee pain EXAM: RIGHT KNEE - COMPLETE 4+ VIEW COMPARISON:  None. FINDINGS: No evidence of fracture, dislocation, or joint effusion. No evidence of significant arthropathy or other focal bone abnormality. Soft tissues are unremarkable. IMPRESSION: No acute osseous abnormality identified. Electronically Signed   By: Ofilia Neas M.D.   On: 04/15/2021 13:42   DG Hip Unilat  With Pelvis 2-3 Views Right  Result Date: 04/15/2021 CLINICAL DATA:  Fall, hip pain EXAM: DG HIP (WITH OR WITHOUT PELVIS) 2-3V RIGHT COMPARISON:  None. FINDINGS: No acute fracture or dislocation identified. Moderate narrowing of the hip joint space with acetabular subchondral sclerosis and small marginal osteophytes. No pelvic fracture visualized. IMPRESSION: No acute osseous abnormality identified. Electronically Signed   By: Ofilia Neas M.D.   On: 04/15/2021 13:43    Procedures Procedures   Medications Ordered in ED Medications  sodium chloride 0.9 % bolus 1,000 mL (1,000 mLs Intravenous New Bag/Given 04/15/21 1631)  fentaNYL (SUBLIMAZE) injection 50 mcg (50 mcg Intravenous Given 04/15/21 1627)  methocarbamol (ROBAXIN) tablet 500 mg (500 mg Oral Given 04/15/21 1620)  dexamethasone (DECADRON) injection 10 mg (10 mg Intravenous Given 04/15/21 1629)  HYDROmorphone (DILAUDID) injection 1 mg (1 mg Intravenous Given 04/15/21 1744)     ED Course  I have reviewed the triage vital signs and the nursing notes.  Pertinent labs & imaging results that were available during my care of the patient were reviewed by me and considered in my medical decision making (see chart for details).  Pleasant 70 year old here for evaluation of fall versus syncope with subsequent back pain and right lower extremity pain.  He is afebrile, nonseptic, not ill-appearing.  Syncope sounds like vasovagal syncope.  Went to stand from bed to go the bathroom felt lightheaded, sweaty and possible syncopal episode however patient states he remembers the episode.  Unfortunately leading up with physician subsequently having right lower extremity pain afterwards.  Positive straight leg raise on the right.  Symptoms consistent with sciatica.  Does have some pain to right knee however full range of motion.  No obvious effusion.  No bony tenderness to tib-fib, femur.  Does have some swelling to his right lateral malleolus.  Able to plantarflex and dorsiflex without difficulty.  Neurovascularly intact.  No clinical evidence of VTE on exam.  Labs and imaging personally reviewed and interpreted:  CBC without leukocytosis, hemoglobin 12.5 similar to prior Trop <2 BMP without acute abnormality DG right knee without any fracture, dislocation, effusion DG right ankle some mild soft tissue swelling however no fracture, dislocation DG right hip without any acute fracture EKG similar to prior CT Lumbar similar to prior  Patient reassessed.  Pain controlled, neurovascularly intact.  Suspect sciatica.  With regards to his fall versus syncope.  He is low risk.  Denies any current symptoms.  Suspect vasovagal.  We will have him follow-up with PCP for this.  No chest pain, shortness of breath to suggest ACS, PE, dissection, arrhythmia as cause of his near syncope  The patient has been appropriately medically screened and/or stabilized in the ED. I have low  suspicion for any  other emergent medical condition which would require further screening, evaluation or treatment in the ED or require inpatient management.  Patient is hemodynamically stable and in no acute distress.  Patient able to ambulate in department prior to ED.  Evaluation does not show acute pathology that would require ongoing or additional emergent interventions while in the emergency department or further inpatient treatment.  I have discussed the diagnosis with the patient and answered all questions.  Pain is been managed while in the emergency department and patient has no further complaints prior to discharge.  Patient is comfortable with plan discussed in room and is stable for discharge at this time.  I have discussed strict return precautions for returning to the emergency department.  Patient was encouraged to follow-up with PCP/specialist refer to at discharge.     MDM Rules/Calculators/A&P                             Final Clinical Impression(s) / ED Diagnoses Final diagnoses:  Fall, initial encounter  Midline low back pain with right-sided sciatica, unspecified chronicity  Acute right ankle pain    Rx / DC Orders ED Discharge Orders          Ordered    oxyCODONE (ROXICODONE) 5 MG immediate release tablet  Every 4 hours PRN        04/15/21 1835    methocarbamol (ROBAXIN) 500 MG tablet  2 times daily        04/15/21 1835    predniSONE (DELTASONE) 50 MG tablet  Daily        04/15/21 1835    lidocaine (LIDODERM) 5 %  Every 24 hours        04/15/21 1835             Lorelei Heikkila A, PA-C 04/15/21 1839    Davonna Belling, MD 04/16/21 [phone]

## 2021-04-17 ENCOUNTER — Ambulatory Visit: Payer: Medicare Other | Admitting: Podiatry

## 2021-04-17 ENCOUNTER — Other Ambulatory Visit: Payer: Self-pay

## 2021-04-17 DIAGNOSIS — M79671 Pain in right foot: Secondary | ICD-10-CM | POA: Diagnosis not present

## 2021-04-17 DIAGNOSIS — T148XXA Other injury of unspecified body region, initial encounter: Secondary | ICD-10-CM | POA: Diagnosis not present

## 2021-04-17 MED ORDER — OXYCODONE-ACETAMINOPHEN 5-325 MG PO TABS
1.0000 | ORAL_TABLET | ORAL | 0 refills | Status: DC | PRN
Start: 2021-04-17 — End: 2021-06-24

## 2021-04-20 ENCOUNTER — Other Ambulatory Visit: Payer: Self-pay | Admitting: Podiatry

## 2021-04-20 DIAGNOSIS — G629 Polyneuropathy, unspecified: Secondary | ICD-10-CM

## 2021-04-22 ENCOUNTER — Encounter: Payer: Self-pay | Admitting: Podiatry

## 2021-04-22 ENCOUNTER — Ambulatory Visit: Payer: Medicare Other | Admitting: Podiatry

## 2021-04-22 NOTE — Progress Notes (Signed)
Subjective:  Patient ID: Lee Mitchell, male    DOB: 09/05/1950,  MRN: [id number]  Chief Complaint  Patient presents with   Foot Injury    Right foot injury     71 y.o. male presents with the above complaint.  Patient presents with right foot and ankle soft tissue contusion.  Patient states that the injury happened on 04/15/2021.  He states is painful to walk on has progressed to gotten worse.  He had x-rays done which did not show any kind of bone involvement.  He was known to Dr. Earleen Newport for treatment of fibroma and ankle arthritis.  He denies any other acute complaints.  He would like to discuss treatment options for this.  He went to get up all of a sudden and fell back down with twisting of the right leg since then he has been having a lot of pain.  Pain scale is 10 out of 10.  He is a diabetic.  He denies any other acute complaints.  He did not pass out.   Review of Systems: Negative except as noted in the HPI. Denies N/V/F/Ch.  Past Medical History:  Diagnosis Date   Chronic abdominal pain    Colon polyps    PNA (pneumonia)    Thyroid cancer ([place])     Current Outpatient Medications:    oxyCODONE-acetaminophen (PERCOCET) 5-325 MG tablet, Take 1 tablet by mouth every 4 (four) hours as needed for severe pain., Disp: 30 tablet, Rfl: 0   albuterol (VENTOLIN HFA) 108 (90 Base) MCG/ACT inhaler, Inhale 2 puffs into the lungs every 6 (six) hours as needed for wheezing or shortness of breath., Disp: , Rfl:    azelastine (ASTELIN) 0.1 % nasal spray, INHALE 1 SPRAY BY NASAL ROUTE 2 TIMES DAILY AS DIRECTED, Disp: , Rfl:    ciprofloxacin (CIPRO) 500 MG tablet, Take 1 tablet (500 mg total) by mouth every 12 (twelve) hours., Disp: 10 tablet, Rfl: 0   diclofenac Sodium (VOLTAREN) 1 % GEL, Apply 2 g topically 4 (four) times daily. Rub into affected area of foot 2 to 4 times daily, Disp: 100 g, Rfl: 2   dicyclomine (BENTYL) 20 MG tablet, Take 1 tablet (20 mg total) by mouth 2 (two) times daily.,  Disp: 20 tablet, Rfl: 0   ezetimibe (ZETIA) 10 MG tablet, Take by mouth., Disp: , Rfl:    HYDROcodone-acetaminophen (NORCO/VICODIN) 5-325 MG tablet, Take 2 tablets by mouth every 6 (six) hours as needed for severe pain., Disp: 6 tablet, Rfl: 0   ibuprofen (ADVIL) 200 MG tablet, Take 200 mg by mouth every 6 (six) hours as needed for headache or mild pain., Disp: , Rfl:    levothyroxine (SYNTHROID) 150 MCG tablet, Take 150 mcg by mouth daily., Disp: , Rfl:    levothyroxine (SYNTHROID) 175 MCG tablet, TAKE 1 TABLET (175 MCG TOTAL) BY MOUTH DAILY BEFORE BREAKFAST., Disp: 90 tablet, Rfl: 1   lidocaine (LIDODERM) 5 %, Place 1 patch onto the skin daily. Remove & Discard patch within 12 hours or as directed by MD, Disp: 30 patch, Rfl: 0   meloxicam (MOBIC) 15 MG tablet, Take 1 tablet (15 mg total) by mouth daily., Disp: 30 tablet, Rfl: 0   metFORMIN (GLUCOPHAGE) 500 MG tablet, Take 500 mg by mouth daily with breakfast., Disp: , Rfl:    methocarbamol (ROBAXIN) 500 MG tablet, Take 1 tablet (500 mg total) by mouth 2 (two) times daily., Disp: 20 tablet, Rfl: 0   metroNIDAZOLE (FLAGYL) 500 MG tablet, Take  1 tablet (500 mg total) by mouth 2 (two) times daily., Disp: 14 tablet, Rfl: 0   mirtazapine (REMERON SOL-TAB) 30 MG disintegrating tablet, DISSOLVE 1 TABLET UNDER TONGUE AT BEDTIME (Patient taking differently: Take 30 mg by mouth at bedtime. Dissolve 1 tablet (30mg ) under tongue at bedtime), Disp: 90 tablet, Rfl: 0   mirtazapine (REMERON) 30 MG tablet, SMARTSIG:1 Tablet(s) By Mouth Every Evening, Disp: , Rfl:    NON FORMULARY, [employer] cream-#14, Disp: , Rfl:    omeprazole (PRILOSEC) 40 MG capsule, TAKE 1 CAPSULE BY MOUTH TWICE A DAY (Patient taking differently: Take 40 mg by mouth in the morning and at bedtime. ), Disp: 90 capsule, Rfl: 1   ondansetron (ZOFRAN ODT) 8 MG disintegrating tablet, Take 1 tablet (8 mg total) by mouth every 8 (eight) hours as needed for nausea., Disp: 20 tablet, Rfl:  0   predniSONE (DELTASONE) 50 MG tablet, Take 1 tablet (50 mg total) by mouth daily., Disp: 5 tablet, Rfl: 0   rosuvastatin (CRESTOR) 40 MG tablet, Take 40 mg by mouth at bedtime. , Disp: , Rfl:    umeclidinium-vilanterol (ANORO ELLIPTA) 62.5-25 MCG/INH AEPB, Inhale 1 puff into the lungs daily. (Patient not taking: Reported on 07/12/2019), Disp: 1 each, Rfl: 0  Social History   Tobacco Use  Smoking Status Every Day   Packs/day: 0.25   Years: 35.00   Pack years: 8.75   Types: Cigarettes  Smokeless Tobacco Never    No Known Allergies Objective:  There were no vitals filed for this visit. There is no height or weight on file to calculate BMI. Constitutional Well developed. Well nourished.  Vascular Dorsalis pedis pulses palpable bilaterally. Posterior tibial pulses palpable bilaterally. Capillary refill normal to all digits.  No cyanosis or clubbing noted. Pedal hair growth normal.  Neurologic Normal speech. Oriented to person, place, and time. Epicritic sensation to light touch grossly present bilaterally.  Dermatologic Nails well groomed and normal in appearance. No open wounds. No skin lesions.  Orthopedic: Generalized foot and ankle pain noted progressed the entire leg.  No focal sites noted.  Swelling noted nonpitting edema.  Pain with range of motion of the ankle joint as well as motor and sensory functions are intact.   Radiographs: 3 views of skeletally mature adult previous x-rays were reviewed no acute osseous abnormalities noted.  Ankle mortise preserved.  No fractures noted.  Plantar heel spur noted. Assessment:   1. Contusion of soft tissue    Plan:  Patient was evaluated and treated and all questions answered.  Right foot and ankle soft tissue contusion generalized -Asked for the patient the etiology of foot and ankle contusion versus treatment options were discussed in extensive detail -Given that he has generalized foot and ankle pain I believe he will benefit  from a cam boot immobilization to allow the pain to be decreased and more localized.  Once that has more localized we cannot effectively treated.  I discussed this with patient in extensive detail he states understanding and will proceed with cam boot immobilization. -Cam boot was dispensed  No follow-ups on file.

## 2021-04-24 ENCOUNTER — Other Ambulatory Visit: Payer: Self-pay | Admitting: Podiatry

## 2021-04-24 ENCOUNTER — Telehealth: Payer: Self-pay | Admitting: *Deleted

## 2021-04-24 MED ORDER — OXYCODONE-ACETAMINOPHEN 5-325 MG PO TABS
1.0000 | ORAL_TABLET | ORAL | 0 refills | Status: DC | PRN
Start: 2021-04-24 — End: 2021-04-29

## 2021-04-24 NOTE — Progress Notes (Signed)
error 

## 2021-04-24 NOTE — Telephone Encounter (Signed)
Patient is calling because his ankle is not getting any better or worse, is icing as instructed but still swollen, hurts with pressure. He would like a refill on the pain medicine(percocet-5/325 mg)  Please advise.

## 2021-04-24 NOTE — Telephone Encounter (Signed)
Patient has been notified thru vmessage

## 2021-04-28 ENCOUNTER — Ambulatory Visit (INDEPENDENT_AMBULATORY_CARE_PROVIDER_SITE_OTHER): Payer: Medicare Other | Admitting: Podiatry

## 2021-04-28 ENCOUNTER — Ambulatory Visit (INDEPENDENT_AMBULATORY_CARE_PROVIDER_SITE_OTHER): Payer: Medicare Other

## 2021-04-28 ENCOUNTER — Other Ambulatory Visit: Payer: Self-pay

## 2021-04-28 DIAGNOSIS — S93609A Unspecified sprain of unspecified foot, initial encounter: Secondary | ICD-10-CM | POA: Diagnosis not present

## 2021-04-28 DIAGNOSIS — M79674 Pain in right toe(s): Secondary | ICD-10-CM | POA: Diagnosis not present

## 2021-04-28 DIAGNOSIS — R609 Edema, unspecified: Secondary | ICD-10-CM | POA: Diagnosis not present

## 2021-04-28 DIAGNOSIS — S93401D Sprain of unspecified ligament of right ankle, subsequent encounter: Secondary | ICD-10-CM | POA: Diagnosis not present

## 2021-04-28 NOTE — Patient Instructions (Signed)
[facility] An [employer] is a type of bandage (dressing) for the foot and leg. The dressing is a gauze wrap that is soaked with a type of medicine called zinc oxide. The gauze may also include other lotions and medicines that help in wound healing, such as calamine. An Unna boot may be used to treat: Open sores (ulcers) on the foot, heel, or leg. Swelling from disorders that affect the veins or lymphatic system (lymphedema). Skin conditions such as chronic inflammation caused by poor blood flow (stasis dermatitis). The dressing is applied by a health care provider. The gauze is wrapped around your lower extremity in several layers, usually starting at the toes and going upward to the knee. A dry outer wrap goes over the medicated wrap for support and compression.  Before applying the [employer], your health care provider will clean your leg and foot and may apply an antibiotic ointment. You may be asked to raise (elevate) your leg for a while to reduce swelling before the boot is applied. The boot will dry and harden after it is applied. The boot may need to be changed or replaced about twice a week. Follow these instructions at home: [place] as told by your health care provider. You may need to wear a slipper or shoe over the boot that is one or two sizes larger than normal. Check the skin around the boot every day. Tell your health care provider about any concerns. Do not stick anything inside the boot to scratch your skin. Doing that increases your risk of infection. Keep your [employer] clean and dry. Check every day for signs of infection. Check for: Redness, swelling, or pain in your foot or toes. Fluid or blood coming from the boot. Pus or a bad smell coming from the boot. Remove the boot and call your health care provider if you have signs of poor blood flow, such as: Your toes tingle or become numb. Your toes turn cold or turn blue or pale. Your toes are more  swollen or painful. You are unable to move your toes. Activity You may walk with the boot once it has dried. Ask your health care provider how much walking is safe for you. Avoid sitting for a long time without moving. Get up to take short walks as told by your health care provider. This is important to improve blood flow. Bathing Do not take baths, swim, or use a hot tub until your health care provider approves. Ask your health care provider if you may take showers. If your health care provider approves a bath or a shower, do not let the Unna boot get wet. If you take a shower, cover the boot with a watertight covering. If you take a bath, keep your leg with the boot out of the tub. General instructions Keep your leg elevated above the level of your heart while you are sitting or lying down. This will decrease swelling. Do not sit with your knee bent for long periods of time. Take over-the-counter and prescription medicines only as told by your health care provider. Do not use any products that contain nicotine or tobacco, such as cigarettes, e-cigarettes, and chewing tobacco. These can delay healing. If you need help quitting, ask your health care provider. Keep all follow-up visits as told by your health care provider. This is important. Contact a health care provider if: Your skin feels itchy inside the boot. You have a burning sensation, a  rash, or itchy, red, swollen areas of skin (hives) in the boot area. You have a fever or chills. You have any signs of infection, such as: New redness, swelling, or pain. More fluid or blood coming from the boot. Pus or a bad smell coming from the boot. You have increased numbness or pain in your foot or toes. You have any changes in skin color on your foot or toes, such as the skin turning blue or pale or developing patchy areas with spots. Your boot has been damaged or feels like it is no longer fitting properly. Summary An Louretta [place] boot is a type of  bandage (dressing) system for the foot and leg. The dressing is a gauze wrap that is soaked with a type of medicine (zinc oxide) to treat foot, heel, or leg ulcers, swelling from disorders that affect the veins or lymphatic system (lymphedema), and skin conditions caused by poor blood flow (stasis dermatitis). This dressing is applied by a health care provider. After it is applied, the boot will dry and harden. The boot may need to be changed or replaced about twice a week. Let your health care provider know if you have any signs of poor blood flow or infection. This information is not intended to replace advice given to you by your health care provider. Make sure you discuss any questions you have with your health care provider. Document Revised: 07/25/2018 Document Reviewed: 12/14/2017 Elsevier Patient Education  [place].

## 2021-04-29 ENCOUNTER — Telehealth: Payer: Self-pay | Admitting: *Deleted

## 2021-04-29 ENCOUNTER — Other Ambulatory Visit: Payer: Self-pay | Admitting: Podiatry

## 2021-04-29 MED ORDER — OXYCODONE-ACETAMINOPHEN 5-325 MG PO TABS: 1.0000 | ORAL_TABLET | ORAL | 0 refills | Status: DC | PRN

## 2021-04-29 NOTE — Telephone Encounter (Signed)
Patient is calling to let the physician know that his MRI has been scheduled for Sat.24 th, is in a lot of pain and would like something called into pharmacy today. Please advise.

## 2021-05-03 NOTE — Progress Notes (Signed)
Subjective: 71 year old male presents the office today for follow-up evaluation of right foot and ankle pain.  He states this happened after he passed out.  He did go to the emergency department at the time.  Also complaining of knee pain. He has an orthopedic doctor but he has not seen him for this but the patient states he will call them if needed.  States he still gets swelling and pain to his foot and his ankle is wearing the cam boot staying off the foot is much as possible.  He states that since the injury has been getting burning to his right foot.  The burning that he was experiencing the leg has improved.  No new concerns or injuries since he was last seen in the office.  Objective: AAO x3, NAD DP/PT pulses palpable bilaterally, CRT less than 3 seconds There is edema present to the right foot and ankle.  Clinically flexor, extensor tendons appear to be intact.  There is discomfort noted along the lateral aspect of the right ankle.  Difficult to fully evaluate anterior drawer given discomfort.  There is diffuse tenderness in the dorsal aspect of metatarsals on the right foot.  Bruising present to the digits.  There is no open sores. No pain with calf compression, swelling, warmth, erythema  Assessment: Right foot, ankle injury  Plan: -All treatment options discussed with the patient including all alternatives, risks, complications.  -Reviewed the x-rays of the ankle from emergency department.  I ordered foot x-rays today.  No evidence of acute fracture identified today. -Given his symptoms and continued pain I ordered MRI of right foot and ankle to rule out tendon tear or to further evaluate for any further osseous injury that is not picked up on x-ray. -Continue ice and elevation.  Continue nonweightbearing for now given the pain.  Pain medication as needed. -Patient encouraged to call the office with any questions, concerns, change in symptoms.   Trula Slade DPM

## 2021-05-04 ENCOUNTER — Encounter: Payer: Self-pay | Admitting: Podiatry

## 2021-05-04 NOTE — Telephone Encounter (Signed)
Returned the call several times, could not get thru.

## 2021-05-09 ENCOUNTER — Other Ambulatory Visit: Payer: Self-pay

## 2021-05-09 ENCOUNTER — Ambulatory Visit
Admission: RE | Admit: 2021-05-09 | Discharge: 2021-05-09 | Disposition: A | Discharge status: Home / Self Care | Payer: Medicare Other | Source: Ambulatory Visit | Attending: Podiatry | Admitting: Podiatry

## 2021-05-09 DIAGNOSIS — S93609A Unspecified sprain of unspecified foot, initial encounter: Secondary | ICD-10-CM

## 2021-05-09 DIAGNOSIS — S93401D Sprain of unspecified ligament of right ankle, subsequent encounter: Secondary | ICD-10-CM

## 2021-05-11 ENCOUNTER — Encounter: Payer: Self-pay | Admitting: Podiatry

## 2021-05-11 ENCOUNTER — Telehealth: Payer: Self-pay | Admitting: *Deleted

## 2021-05-11 ENCOUNTER — Other Ambulatory Visit: Payer: Self-pay | Admitting: Podiatry

## 2021-05-11 MED ORDER — OXYCODONE-ACETAMINOPHEN 5-325 MG PO TABS: 1.0000 | ORAL_TABLET | Freq: Four times a day (QID) | ORAL | 0 refills | Status: DC | PRN

## 2021-05-11 NOTE — Telephone Encounter (Signed)
Patient is calling for a refill on his pain medicine(oxycodone -ace,5-325 mg) still having pain,swelling across the top of foot and ankle.  He is requesting MRI results He wanted to let the doctor know that he stopped wearing the boot 3 days ago, was causing back pain, can't wear a shoe as well.  Please advise.

## 2021-05-12 DIAGNOSIS — M79674 Pain in right toe(s): Secondary | ICD-10-CM

## 2021-05-12 NOTE — Telephone Encounter (Signed)
Patient came in today and picked up a darco shoe(medium). He feels this shoe will work better.

## 2021-05-12 NOTE — Telephone Encounter (Signed)
Spoke with patient and he is coming to pick up [place] sometimes today before 12.

## 2021-05-25 ENCOUNTER — Ambulatory Visit (INDEPENDENT_AMBULATORY_CARE_PROVIDER_SITE_OTHER): Payer: Medicare Other | Admitting: Podiatry

## 2021-05-25 ENCOUNTER — Other Ambulatory Visit: Payer: Self-pay

## 2021-05-25 DIAGNOSIS — S93401D Sprain of unspecified ligament of right ankle, subsequent encounter: Secondary | ICD-10-CM

## 2021-05-25 DIAGNOSIS — M84374D Stress fracture, right foot, subsequent encounter for fracture with routine healing: Secondary | ICD-10-CM | POA: Diagnosis not present

## 2021-05-25 DIAGNOSIS — R609 Edema, unspecified: Secondary | ICD-10-CM | POA: Diagnosis not present

## 2021-05-25 NOTE — Patient Instructions (Signed)
[facility] An [employer] is a type of bandage (dressing) for the foot and leg. The dressing is a gauze wrap that is soaked with a type of medicine called zinc oxide. The gauze may also include other lotions and medicines that help in wound healing, such as calamine. An Unna boot may be used to treat: Open sores (ulcers) on the foot, heel, or leg. Swelling from disorders that affect the veins or lymphatic system (lymphedema). Skin conditions such as chronic inflammation caused by poor blood flow (stasis dermatitis). The dressing is applied by a health care provider. The gauze is wrapped around your lower extremity in several layers, usually starting at the toes and going upward to the knee. A dry outer wrap goes over the medicated wrap for support and compression.  Before applying the [employer], your health care provider will clean your leg and foot and may apply an antibiotic ointment. You may be asked to raise (elevate) your leg for a while to reduce swelling before the boot is applied. The boot will dry and harden after it is applied. The boot may need to be changed or replaced about twice a week. Follow these instructions at home: [place] as told by your health care provider. You may need to wear a slipper or shoe over the boot that is one or two sizes larger than normal. Check the skin around the boot every day. Tell your health care provider about any concerns. Do not stick anything inside the boot to scratch your skin. Doing that increases your risk of infection. Keep your [employer] clean and dry. Check every day for signs of infection. Check for: Redness, swelling, or pain in your foot or toes. Fluid or blood coming from the boot. Pus or a bad smell coming from the boot. Remove the boot and call your health care provider if you have signs of poor blood flow, such as: Your toes tingle or become numb. Your toes turn cold or turn blue or pale. Your toes are more  swollen or painful. You are unable to move your toes. Activity You may walk with the boot once it has dried. Ask your health care provider how much walking is safe for you. Avoid sitting for a long time without moving. Get up to take short walks as told by your health care provider. This is important to improve blood flow. Bathing Do not take baths, swim, or use a hot tub until your health care provider approves. Ask your health care provider if you may take showers. If your health care provider approves a bath or a shower, do not let the Unna boot get wet. If you take a shower, cover the boot with a watertight covering. If you take a bath, keep your leg with the boot out of the tub. General instructions Keep your leg elevated above the level of your heart while you are sitting or lying down. This will decrease swelling. Do not sit with your knee bent for long periods of time. Take over-the-counter and prescription medicines only as told by your health care provider. Do not use any products that contain nicotine or tobacco, such as cigarettes, e-cigarettes, and chewing tobacco. These can delay healing. If you need help quitting, ask your health care provider. Keep all follow-up visits as told by your health care provider. This is important. Contact a health care provider if: Your skin feels itchy inside the boot. You have a burning sensation, a  rash, or itchy, red, swollen areas of skin (hives) in the boot area. You have a fever or chills. You have any signs of infection, such as: New redness, swelling, or pain. More fluid or blood coming from the boot. Pus or a bad smell coming from the boot. You have increased numbness or pain in your foot or toes. You have any changes in skin color on your foot or toes, such as the skin turning blue or pale or developing patchy areas with spots. Your boot has been damaged or feels like it is no longer fitting properly. Summary An Louretta Parma boot is a type of  bandage (dressing) system for the foot and leg. The dressing is a gauze wrap that is soaked with a type of medicine (zinc oxide) to treat foot, heel, or leg ulcers, swelling from disorders that affect the veins or lymphatic system (lymphedema), and skin conditions caused by poor blood flow (stasis dermatitis). This dressing is applied by a health care provider. After it is applied, the boot will dry and harden. The boot may need to be changed or replaced about twice a week. Let your health care provider know if you have any signs of poor blood flow or infection. This information is not intended to replace advice given to you by your health care provider. Make sure you discuss any questions you have with your health care provider. Document Revised: 07/25/2018 Document Reviewed: 12/14/2017 Elsevier Patient Education  [place].

## 2021-05-28 NOTE — Progress Notes (Signed)
Subjective: 71 year old male presents the office today for follow-up evaluation of right foot and ankle pain.  Presents today wearing regular shoe.  He states the foot is causing other issues.  States that the pain is better but still having discomfort.  He is scheduled follow-up with neurology in March.  He has not yet seen his primary care physician and is looking to switch at this time.  No new injury since last saw him.  Objective: AAO x3, NAD DP/PT pulses palpable bilaterally, CRT less than 3 seconds There is decreased edema present to the right foot and ankle.  Clinically the flexor, extensor tendons appear to be intact.  There is still discomfort however  along the lateral aspect of the right ankle on the lateral ankle complex.  There is mild edema noted.there is still discomfort noted on the dorsal aspect of the metatarsals with some slight swelling.  No erythema or warmth. No pain with calf compression, swelling, warmth, erythema  Assessment: Right foot, ankle injury  Plan: -All treatment options discussed with the patient including all alternatives, risks, complications.  -New Unna boot was applied today.  Surgical shoe dispensed.  Continue ice and elevate.  I will see him back next appointment and we will repeat x-rays. -Continue to ice/elevate -Recommended to follow-up with his primary care physician as well.  Trula Slade DPM

## 2021-05-29 ENCOUNTER — Other Ambulatory Visit: Payer: Self-pay | Admitting: Podiatry

## 2021-05-29 MED ORDER — ACETAMINOPHEN-CODEINE #3 300-30 MG PO TABS
1.0000 | ORAL_TABLET | Freq: Four times a day (QID) | ORAL | 0 refills | Status: DC | PRN
Start: 2021-05-29 — End: 2021-06-08

## 2021-06-08 ENCOUNTER — Ambulatory Visit (INDEPENDENT_AMBULATORY_CARE_PROVIDER_SITE_OTHER): Payer: Medicare Other

## 2021-06-08 ENCOUNTER — Ambulatory Visit: Payer: Medicare Other | Admitting: Podiatry

## 2021-06-08 ENCOUNTER — Other Ambulatory Visit: Payer: Self-pay

## 2021-06-08 DIAGNOSIS — S92334D Nondisplaced fracture of third metatarsal bone, right foot, subsequent encounter for fracture with routine healing: Secondary | ICD-10-CM

## 2021-06-08 DIAGNOSIS — M79671 Pain in right foot: Secondary | ICD-10-CM

## 2021-06-08 DIAGNOSIS — S93401D Sprain of unspecified ligament of right ankle, subsequent encounter: Secondary | ICD-10-CM

## 2021-06-08 DIAGNOSIS — M778 Other enthesopathies, not elsewhere classified: Secondary | ICD-10-CM | POA: Diagnosis not present

## 2021-06-08 MED ORDER — ACETAMINOPHEN-CODEINE #3 300-30 MG PO TABS: 1.0000 | ORAL_TABLET | Freq: Four times a day (QID) | ORAL | 0 refills | Status: AC | PRN

## 2021-06-08 NOTE — Telephone Encounter (Signed)
Please advise 

## 2021-06-13 NOTE — Progress Notes (Signed)
Subjective: 71 year old male presents the office today for follow-up evaluation of right foot and ankle pain.  He states that the swelling has improved.  He still gets discomfort and he is wearing a surgical shoe but he does not that he does not wear all the time.  He does have to wear his regular shoe to walk some.  No new injuries.  Objective: AAO x3, NAD DP/PT pulses palpable bilaterally, CRT less than 3 seconds There is decreased edema present to the right foot and ankle.  There is tenderness palpation present on the third metatarsal today.  Flexor, extensor tendons appear to be intact.  There is also discomfort in the lateral ankle complex.  No gross instability noted. No pain with calf compression, swelling, warmth, erythema  Assessment: Right foot third metatarsal fracture, ankle sprain  Plan: -All treatment options discussed with the patient including all alternatives, risks, complications.  -X-rays today reviewed.  Fracture noted third metatarsal base without displacement.  There does appear to be some bony ingrowth noted. -For now continue with surgical shoe for offloading to allow the fracture to heal.  Once this is better he will then refer to physical therapy to help with ankle sprain, foot pain as well. -Continue to ice and elevate.  Return in about 3 weeks (around 06/29/2021).  Repeat foot x-ray  Trula Slade DPM

## 2021-06-16 ENCOUNTER — Other Ambulatory Visit: Payer: Self-pay | Admitting: Podiatry

## 2021-06-16 DIAGNOSIS — M778 Other enthesopathies, not elsewhere classified: Secondary | ICD-10-CM

## 2021-06-16 MED ORDER — ACETAMINOPHEN-CODEINE #3 300-30 MG PO TABS
1.0000 | ORAL_TABLET | Freq: Four times a day (QID) | ORAL | 0 refills | Status: DC | PRN
Start: 2021-06-16 — End: 2021-06-24

## 2021-06-24 ENCOUNTER — Telehealth: Payer: Self-pay | Admitting: Neurology

## 2021-06-24 ENCOUNTER — Encounter: Payer: Self-pay | Admitting: Neurology

## 2021-06-24 ENCOUNTER — Ambulatory Visit: Payer: Medicare Other | Admitting: Neurology

## 2021-06-24 VITALS — Ht 74.0 in | Wt 169.2 lb

## 2021-06-24 DIAGNOSIS — R4020 Unspecified coma: Secondary | ICD-10-CM | POA: Diagnosis not present

## 2021-06-24 DIAGNOSIS — R55 Syncope and collapse: Secondary | ICD-10-CM

## 2021-06-24 NOTE — Telephone Encounter (Signed)
[employer] medicare order sent to [facility], [facility] they will reach out to the patient to schedule.  

## 2021-06-24 NOTE — Patient Instructions (Addendum)
Non-Drug Treatment for Low Blood Pressure on Standing:  1. Changing Postures:  Change posture slowly when getting up, especially in the morning  Hold on to something during the first few minutes after standing up. Do not start walking as soon as you get up from the chair  Avoid prolonged recumbency or lying down  Raise the head of the bed by 10 to 20 degrees  2. Exercise:  Perform Isotonic exercise, e.g.recumbent bike, pedaling movements while sitting in a chair  Avoid exercises where you have to strain   3. Avoid Pooling of blood in legs:  Wear custom-fitted elastic stockings. The ones which extend to the abdomen work even better. Consider wearing an abdominal binder.  Perform physical counter-maneuvers, such as crossing legs and tensing leg muscles.  4. Eating and Drinking:  Small meals are recommended. Avoid large meals. Avoid standing suddenly after a large meal  Avoid alcohol  Increase intake of fluids and regular salt. A daily intake of up to 10 grams of sodium per day and a fluid intake of 2.0 to 2.5 liters per day (8 to 10 glasses of water) is recommended.   Rapid (over 3 minutes) ingestion of approximately 0.5 liter (2 glasses of water) of tap water, raises blood pressure within 5 to 15 minutes and lasts for an hour.  5. Other Tips:  Avoid hot baths. Instead take warm baths  Maintain a BP record standing and lying down  If you are only any BP lowering drugs (antihypertensives, diuretics, antidepressants, drugs for prostate, etc) ask your doctor to revisit the need to keep you on these drugs  If all non-drug therapy fails, ask your doctor about drug therapy   Follow-up with primary care physician.  What is vasovagal syncope? Vasovagal syncope is a condition that leads to fainting in some people. It's also called neurocardiogenic syncope or reflex syncope. It's the most common cause of fainting. It's usually not harmful and not a sign of a more serious problem.  Many nerves  connect with your heart and blood vessels. These nerves help control the speed and force of your heartbeat. They also regulate blood pressure by controlling whether your blood vessels widen or tighten. Usually, these nerves coordinate their actions so you always get enough blood to your brain. Under certain situations, these nerves might give an inappropriate signal. This might cause your blood vessels to open wide. At the same time, your heartbeat may slow down. Blood can pool in your legs which leads to a drop in blood pressure, and not enough of it may reach the brain. If that happens, you may briefly lose consciousness. When you lie or fall down, blood flow to the brain resumes.  Vasovagal syncope is quite common. It most often affects children and young adults, but it can happen at any age. It happens to men and women in about equal numbers. Unlike some other causes of fainting, vasovagal syncope does not signal an underlying problem with the heart or brain.  What causes vasovagal syncope? Several triggers can cause vasovagal syncope. To help reduce the risk of fainting, you can stay away from some of these triggers such as:  Standing for long periods Excess heat Intense emotion, such as fear Intense pain The sight of blood or a needle Prolonged exercise Dehydration Skipping meals Other triggers include:  Urinating Swallowing Coughing Having a bowel movement What are the symptoms of vasovagal syncope? Fainting is the defining symptom of vasovagal syncope. Often you may have certain symptoms before  actually fainting such as:  Nausea Warmth Turning pale Getting sweaty palms Feeling dizzy or lightheaded Blurred vision If you can lie down at the first sign of these symptoms, you will often be able to prevent fainting. When it happens, this type of fainting almost always happens in a sitting or standing position. Not everyone notices symptoms before fainting, however.  When a person  does faint, lying down restores blood flow to the brain. Consciousness should return fairly quickly. You might not feel normal for a little while after you faint. You might feel depressed or fatigued for a short time. Some people even feel nauseous and may vomit.  Some people have only 1 or 2 episodes of vasovagal syncope in their life. For others, the problem is more chronic and happens with no warning.  How is vasovagal syncope diagnosed? Your doctor will review your medical history and do a physical exam. This will probably include measuring the blood pressure while lying down, seated, and then standing. Your doctor will likely do an electrocardiogram (ECG) as well, to evaluate the hearts rhythm. For many children and young adults, this may be all that is needed. Usually, the doctor can safely assume that the fainting is due to vasovagal syncope, and not some form of syncope that is more dangerous.  Sometimes the doctor needs to check for other possible causes for fainting. Because some causes of fainting are dangerous, the doctor will want to rule out these other causes. Your doctor might use tests such as the following:  Continuous portable ECG monitoring, to further analyze heart rhythms Echocardiogram, to examine blood flow in the heart and heart motion Exercise stress testing, to see how your heart works during exercise Blood work, only if your doctor is suspicious for an abnormality If these tests are normal, you might need something called a tilt table test. For this test, you lie down on a padded table. Someone measures your heart rate and blood pressure while you are lying down and then tilted up for a period of time. Sometime medicine is also given to trigger a fainting response. If you have vasovagal syncope, you may faint during the upward tilt.  How is vasovagal syncope treated? Watch for the warning signs of vasovagal syncope, like dizziness, nausea, or sweaty palms. If you have a  history of vasovagal syncope and think you are about to faint, lie down right away. Tensing your arms or crossing your legs can help prevent fainting. Passively raising or propping up your legs in the air can also help.  To immediately treat someone who has fainted from vasovagal syncope, help the person lie down and lift their legs up in the air. This will restore blood flow to the brain, and the person should quickly regain consciousness. The person should lie down for a little while afterwards.  If you have had episodes of vasovagal syncope, your doctor might make some suggestions on how to help prevent fainting. These might include:  Avoiding triggers, such as standing for a long time or the sight of blood Moderate exercise training Discontinuing medicines that lower blood pressure, like diuretics Eating a higher salt diet, to help keep up blood volume Drinking plenty of fluids, to maintain blood volume Wearing compression stockings or abdominal binders Occasionally, you may need medicine to help control vasovagal syncope. However, research on these medicines has revealed uncertain benefits in vasovagal syncope. These are usually only considered when a person has multiple episodes of fainting. Some of the medicines  your doctor may advise a trial of include:  Alpha-1-adrenergic agonists, to increase blood pressure Corticosteroids, to help increase the sodium and fluid levels Serotonin reuptake inhibitors (SSRIs), to moderate the nervous system response If these medicines are ineffective, doctors sometimes try orthostatic training. This method uses a tilt table to gradually increase the amount of time spent upright. Rarely, in cases where a significant slowing of the heartbeat or pausing is detected, a heart pacemaker is needed.  What are possible complications of vasovagal syncope? Vasovagal syncope itself is generally not dangerous. Of course, fainting can be dangerous if it happens at  certain times, like while driving. Most people with rare episodes of vasovagal syncope can drive safely. If you have chronic syncope that is not under control, your doctor may advise against driving. This is especially likely if you dont usually have warning signs before you faint. Ask your doctor about what is safe for you to do.  When should I call my healthcare provider? See a doctor right away if you have recurrent episodes of passing out or other related problems.  Key points about vasovagal syncope Vasovagal syncope is the most common cause of fainting. It happens when the blood vessels open too wide or the heartbeat slows, causing a temporary lack of blood flow to the brain. It's generally not a dangerous condition. To prevent fainting, stay out of hot places and don't stand for long periods. If you feel lightheaded, nauseous, or sweaty, lie down right away and raise your legs. Most people with occasional vasovagal syncope need to make only lifestyle changes such as drinking more fluids and eating more salt. Some people may need medicine or even a heart pacemaker. Next steps Tips to help you get the most from a visit to your healthcare provider:  Know the reason for your visit and what you want to happen. Before your visit, write down questions you want answered. Bring someone with you to help you ask questions and remember what your provider tells you. At the visit, write down the name of a new diagnosis, and any new medicines, treatments, or tests. Also write down any new instructions your provider gives you. Know why a new medicine or treatment is prescribed, and how it will help you. Also know what the side effects are. Ask if your condition can be treated in other ways. Know why a test or procedure is recommended and what the results could mean. Know what to expect if you do not take the medicine or have the test or procedure. If you have a follow-up appointment, write down the  date, time, and purpose for that visit. Know how you can contact your provider if you have questions.

## 2021-06-24 NOTE — Progress Notes (Signed)
[facility]    Provider:  Dr [facility] Requesting Provider: Trula Mitchell, DPM Primary Care Provider:  Patrecia Mitchell, Lee Grief, MD  CC:  syncope  HPI:  Lee Mitchell is a 71 y.o. male here as requested by Lee [facility], DPM for right leg numbness. PMHx HTN, CAD, DM, Chronic pain, depression, tobacco and cannabis abuse, drug-seeking behavior. I reviewed ED notes from 04/15/2022:  Pain primarily starts at back and extends down his leg.  No circumferential swelling, ecchymosis.  Rates his pain a 10/10.  Has had prior lumbar surgery many years ago, diagnosed with Sciatica in the ED. Saw Dr. Jacqualyn Mitchell for right foot pain who diagnosed fibroma and ankle arthritis, he was seen by podiatry and they diagnosed Right foot and ankle soft tissue contusion generalized And recommended a cam boot immobilization to allow the pain to be decreased and more localized.  Dr Lee Mitchell followed up, noted  edema present to the right foot and ankle and that flexor, extensor tendons appear to be intact, discomfort noted along the lateral aspect of the right ankle, there is diffuse tenderness in the dorsal aspect of metatarsals on the right foot.  Bruising present to the digits.  There is no open sores. MRI of the foot and ankle were ordered. The last time he saw Dr. Jacqualyn Mitchell, assessment was right foot third metatarsal fracture, ankle sprain, -X-rays reviewed and showed fracture noted third metatarsal base without displacement.  There does appear to be some bony ingrowth noted, recommended surgical shoe for offloading to allow the fracture to heal.  Once this is better he will then refer to physical therapy to help with ankle sprain, foot pain as well, continue to ice and elevate.  He says he is here for passing out. He saw his primary care, saw cardiology. Started in 2011. About 5 years ago it happened again, he got up, always when he starts getting up, he starts sweating, he sits down and he feels better,  then he gets up and passing out. Always with sitting to standing and also happens when he is in pain. Happened a month ago, he was in the emergency room, no seizure activity, no urination or defecation, not confused afterwards, he was dizzy and lightheaded. Laying down makes him feel better. He has a broken foot and hi ankle hurts. He is currently being evaluated with cardiology. Usually minutes.   I reviewed Lee Mitchell's notes: Reassured the patient that his episode of passing out is likely related to vasovagal syncope triggered by severe pain. However, would like to update a TTE to assess LVEF, valvular function, biventricular function, and other cardiac anatomy for additional work-up.  Reviewed notes, labs and imaging from outside physicians, which showed:  05/11/2021: creatinine 0.97 BUN 17  MR right foot: 05/09/2021: IMPRESSION: 1. Fairly marked diffuse edema like signal changes in the third metatarsal shaft. Could not exclude a proximal stress fracture. CT may be helpful for further evaluation. 2. Patchy signal abnormality in several other bony structures as detailed above. This could be degenerative in nature but stress related changes are more likely possibly due to altered mechanics or prior trauma. 3. Chronic Achilles tendinopathy. 4. Intact ankle tendons and ligaments.  MRI Ankle 05/09/2021: IMPRESSION: 1. Fairly marked diffuse edema like signal changes in the third metatarsal shaft. Could not exclude a proximal stress fracture. CT may be helpful for further evaluation. 2. Patchy signal abnormality in several other bony structures as detailed above. This could be degenerative in nature but stress  related changes are more likely possibly due to altered mechanics or prior trauma. 3. Chronic Achilles tendinopathy. 4. Intact ankle tendons and ligaments  Review of Systems: Patient complains of symptoms per HPI as well as the following symptoms syncope. Pertinent negatives and  positives per HPI. All others negative.   Social History   Socioeconomic History   Marital status: Single    Spouse name: Not on file   Number of children: 2   Years of education: Not on file   Highest education level: Not on file  Occupational History   Occupation: event planner   Tobacco Use   Smoking status: Every Day    Packs/day: 0.25    Years: 35.00    Pack years: 8.75    Types: Cigarettes   Smokeless tobacco: Never  Vaping Use   Vaping Use: Never used  Substance and Sexual Activity   Alcohol use: Yes    Comment: occ   Drug use: Not Currently    Types: Marijuana   Sexual activity: Not Currently  Other Topics Concern   Not on file  Social History Narrative   Not on file   Social Determinants of Health   Financial Resource Strain: Not on file  Food Insecurity: Not on file  Transportation Needs: Not on file  Physical Activity: Not on file  Stress: Not on file  Social Connections: Not on file  Intimate Partner Violence: Not on file    Family History  Problem Relation Age of Onset   Hypertension Mother    Hyperthyroidism Mother    Cancer Mother 41       thyroid cancer   Kidney cancer Father    Hypertension Father    Thyroid cancer Maternal Uncle    Neuropathy Neg Hx     Past Medical History:  Diagnosis Date   Chronic abdominal pain    Colon polyps    PNA (pneumonia)    Thyroid cancer [facility])     Patient Active Problem List   Diagnosis Date Noted   Pain in right knee 03/24/2020   Coronary artery disease involving native coronary artery of native heart without angina pectoris 04/25/2019   Other spondylosis with radiculopathy, lumbar region 03/13/2019   Pre-op evaluation 01/19/2019   Thallium stress test abnormal 12/22/2018   DOE (dyspnea on exertion) 12/14/2018   Abnormal electrocardiogram (ECG) (EKG) 12/14/2018   Abnormal electrocardiogram 12/14/2018   Deep vein thrombosis (DVT) of calf muscle vein of left lower extremity ([place]) 12/14/2018    Encounter for screening for HIV 07/12/2018   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   Drug-seeking behavior 05/21/2018   Opiates and related narcotics causing adverse effect in therapeutic use, initial encounter 05/21/2018   Left rotator cuff tear 05/18/2018   Cannabis abuse 04/29/2018   Postoperative hypothyroidism 04/26/2018   Type II diabetes mellitus with manifestations ([place]) 04/26/2018   Hyperlipidemia LDL goal <100 04/26/2018   Routine general medical examination at a health care facility 04/26/2018   Primary osteoarthritis involving multiple joints 04/26/2018   PSA elevation 04/26/2018   Hypertension 04/26/2018   Benign prostatic hyperplasia without lower urinary tract symptoms 04/26/2018   Encounter for long-term opiate analgesic use 04/26/2018   Colon cancer screening 04/26/2018   Centrilobular emphysema ([place]) 04/03/2018   Parapneumonic effusion 11/21/2017   Pneumonia 11/16/2017   Degeneration of lumbar intervertebral disc 10/06/2017   Arthralgia of both knees 09/21/2017   Tobacco use disorder, moderate, dependence 03/28/2017   MDD (major depressive disorder), recurrent episode, moderate ([place])  03/27/2017   MDD (major depressive disorder), recurrent episode, severe ([place]) 02/15/2017   Chronic pain syndrome 02/15/2017   Body mass index (BMI) 25.0-25.9, adult 04/25/2015   ADD (attention deficit disorder) 09/25/2013   Malignant neoplasm of thyroid gland ([place]) 02/19/2013   Arthritis 10/18/2012   H/O shoulder surgery 10/18/2012   Shoulder pain 10/18/2012   Snoring 10/18/2012   GERD 03/22/2010    Past Surgical History:  Procedure Laterality Date   ANKLE ARTHROSCOPY WITH RECONSTRUCTION     BACK SURGERY     CHOLECYSTECTOMY     COLONOSCOPY     ELBOW ARTHROSCOPY WITH TENDON RECONSTRUCTION     SHOULDER ARTHROSCOPY WITH LABRAL REPAIR     THYROIDECTOMY     2004/2005   ULNAR NERVE TRANSPOSITION      Current Outpatient Medications  Medication Sig Dispense Refill    diclofenac Sodium (VOLTAREN) 1 % GEL Apply 2 g topically 4 (four) times daily. Rub into affected area of foot 2 to 4 times daily 100 g 2   ibuprofen (ADVIL) 200 MG tablet Take 200 mg by mouth every 6 (six) hours as needed for headache or mild pain.     levothyroxine (SYNTHROID) 150 MCG tablet Take 150 mcg by mouth daily.     levothyroxine (SYNTHROID) 175 MCG tablet TAKE 1 TABLET (175 MCG TOTAL) BY MOUTH DAILY BEFORE BREAKFAST. 90 tablet 1   metFORMIN (GLUCOPHAGE) 500 MG tablet Take 500 mg by mouth daily with breakfast.     mirtazapine (REMERON SOL-TAB) 30 MG disintegrating tablet DISSOLVE 1 TABLET UNDER TONGUE AT BEDTIME (Patient taking differently: Take 30 mg by mouth at bedtime. Dissolve 1 tablet ('30mg'$ ) under tongue at bedtime) 90 tablet 0   mirtazapine (REMERON) 30 MG tablet SMARTSIG:1 Tablet(s) By Mouth Every Evening     NON FORMULARY [facility]  Wart cream-#14     rosuvastatin (CRESTOR) 40 MG tablet Take 40 mg by mouth at bedtime.      albuterol (VENTOLIN HFA) 108 (90 Base) MCG/ACT inhaler Inhale 2 puffs into the lungs every 6 (six) hours as needed for wheezing or shortness of breath.     ezetimibe (ZETIA) 10 MG tablet Take by mouth.     No current facility-administered medications for this visit.    Allergies as of 06/24/2021   (No Known Allergies)    Vitals: Ht '6\' 2"'$  (1.88 m)    Wt 169 lb 3.2 oz (76.7 kg)    BMI 21.72 kg/m  Last Weight:  Wt Readings from Last 1 Encounters:  06/24/21 169 lb 3.2 oz (76.7 kg)   Last Height:   Ht Readings from Last 1 Encounters:  06/24/21 '6\' 2"'$  (1.88 m)     Physical exam: Exam: Gen: NAD, conversant                   CV: RRR, no MRG. No Carotid Bruits. No peripheral edema, warm, nontender Eyes: Conjunctivae clear without exudates or hemorrhage  Neuro: Detailed Neurologic Exam  Speech:    Speech is normal; fluent and spontaneous with normal comprehension.  Cognition:    The patient is oriented to person, place, and time;      recent and remote memory intact;     language fluent;     normal attention, concentration,     fund of knowledge Cranial Nerves:    The pupils are equal, round, and reactive to light. The fundi are normal and spontaneous venous pulsations are present. Visual fields are full to finger confrontation. Extraocular movements  are intact. Trigeminal sensation is intact and the muscles of mastication are normal. The face is symmetric. The palate elevates in the midline. Hearing intact. Voice is normal. Shoulder shrug is normal. The tongue has normal motion without fasciculations.   Coordination:    Normal  Gait:   Antalgic (right foot boot)  Motor Observation:    No asymmetry, no atrophy, and no involuntary movements noted. Tone:    Normal muscle tone.    Posture:    Posture is normal. normal erect    Strength:    Strength is V/V in the upper and lower limbs. Giveway in the lowers due to pain but strength appears intact.      Sensation: intact to LT     Reflex Exam:  DTR's:    Deep tendon reflexes in the upper and lower extremities are symmetrical bilaterally(did not examine right foot in a brace and painful) Toes:    The left toes are downgoing bilaterally.   Clonus:    Clonus is absent left foot  BP 116/72 p80 on the low side    Assessment/Plan:  71 y.o. male here as requested by Lee Mitchell, DPM for right leg numbness. PMHx HTN, CAD, DM, Chronic pain, depression, tobacco and cannabis abuse, drug-seeking behavior. He says he is here for syncope and not leg numbness. Symptoms sound vasovagal, he has been ongoing evaluation with Cardiology who also concurs sounds vasovagal. To ensure no stroke or seizures(highly unlikely this is the etiology) will order MRI brain and EEG. His blood pressure is also low today, systolic 073 and borderline orthostatic (similar to cardiology results) advised and discussed conservative measures.  Follow-up with primary care physician and  cardiology   Orders Placed This Encounter  Procedures   MR BRAIN W WO CONTRAST   EEG adult   No orders of the defined types were placed in this encounter.   Cc: Lee [facility], DPM,  Doreatha Lew, MD  Sarina Ill, MD  [facility] [address] [place], [place] [address]  Phone [phone] Fax [phone]

## 2021-06-25 ENCOUNTER — Other Ambulatory Visit: Payer: Self-pay | Admitting: Podiatry

## 2021-06-25 MED ORDER — ACETAMINOPHEN-CODEINE #3 300-30 MG PO TABS
1.0000 | ORAL_TABLET | Freq: Four times a day (QID) | ORAL | 0 refills | Status: DC | PRN
Start: 2021-06-25 — End: 2021-10-06

## 2021-06-29 ENCOUNTER — Ambulatory Visit: Payer: Medicare Other | Admitting: Podiatry

## 2021-06-29 ENCOUNTER — Ambulatory Visit: Payer: Medicare Other | Admitting: Neurology

## 2021-07-02 ENCOUNTER — Ambulatory Visit (INDEPENDENT_AMBULATORY_CARE_PROVIDER_SITE_OTHER): Payer: Medicare Other | Admitting: Neurology

## 2021-07-02 ENCOUNTER — Telehealth: Payer: Self-pay | Admitting: Neurology

## 2021-07-02 DIAGNOSIS — R55 Syncope and collapse: Secondary | ICD-10-CM | POA: Diagnosis not present

## 2021-07-02 NOTE — Procedures (Signed)
? ? ?  History: ? ?71 year old man with syncope  ? ?EEG classification: Awake and drowsy ? ?Description of the recording: The background rhythms of this recording consists of a fairly well modulated medium amplitude alpha rhythm of 10 Hz that is reactive to eye opening and closure. As the record progresses, the patient appears to remain in the waking state throughout the recording. Photic stimulation was performed, did not show any abnormalities. Hyperventilation was also performed, did not show any abnormalities. Toward the end of the recording, the patient enters the drowsy state with slight symmetric slowing seen. The patient never enters stage II sleep. No abnormal epileptiform discharges seen during this recording. There was no focal slowing. EKG monitor shows no evidence of cardiac rhythm abnormalities with a heart rate of 72. ? ?Abnormality: None  ? ?Impression: This is a normal EEG recording in the waking and drowsy state. No evidence of interictal epileptiform discharges seen. A normal EEG does not exclude a diagnosis of epilepsy.  ? ? ?Alric Ran, MD ?[facility] ?  ?

## 2021-07-02 NOTE — Telephone Encounter (Signed)
Pt called, having transportation issue, going to try to make the appt at 10:45am. Informed pt if late may have to reschedule appt. Pt verbalized understand. ?

## 2021-07-07 ENCOUNTER — Other Ambulatory Visit: Payer: Self-pay | Admitting: Neurology

## 2021-07-07 ENCOUNTER — Telehealth: Payer: Self-pay | Admitting: Neurology

## 2021-07-07 MED ORDER — ALPRAZOLAM 0.25 MG PO TABS: ORAL_TABLET | ORAL | 0 refills | Status: DC

## 2021-07-07 NOTE — Telephone Encounter (Signed)
Pt ask if physician can prescribe medication to take before MRI 06/13/21. Would like a call from the nurse to confirm medication has been sent to [id number]?

## 2021-07-11 ENCOUNTER — Ambulatory Visit
Admission: RE | Admit: 2021-07-11 | Discharge: 2021-07-11 | Disposition: A | Discharge status: Home / Self Care | Payer: Medicare Other | Source: Ambulatory Visit | Attending: Neurology | Admitting: Neurology

## 2021-07-11 ENCOUNTER — Other Ambulatory Visit: Payer: Self-pay

## 2021-07-11 DIAGNOSIS — R55 Syncope and collapse: Secondary | ICD-10-CM

## 2021-07-11 DIAGNOSIS — R4020 Unspecified coma: Secondary | ICD-10-CM

## 2021-07-11 MED ORDER — GADOBENATE DIMEGLUMINE 529 MG/ML IV SOLN
15.0000 mL | Freq: Once | INTRAVENOUS | Status: AC | PRN
  Administered 2021-07-11: 15 mL via INTRAVENOUS

## 2021-07-14 ENCOUNTER — Other Ambulatory Visit: Payer: Self-pay

## 2021-07-14 ENCOUNTER — Ambulatory Visit (INDEPENDENT_AMBULATORY_CARE_PROVIDER_SITE_OTHER): Payer: Medicare Other

## 2021-07-14 ENCOUNTER — Ambulatory Visit: Payer: Medicare Other | Admitting: Podiatry

## 2021-07-14 DIAGNOSIS — S92334D Nondisplaced fracture of third metatarsal bone, right foot, subsequent encounter for fracture with routine healing: Secondary | ICD-10-CM

## 2021-07-14 DIAGNOSIS — S93401D Sprain of unspecified ligament of right ankle, subsequent encounter: Secondary | ICD-10-CM | POA: Diagnosis not present

## 2021-07-19 NOTE — Progress Notes (Signed)
Subjective: ?71 year old male presents the office today for follow-up evaluation of right foot and ankle pain.  Overall states that he is doing better with the foot and ankle.  He is scheduled for arthroscopy of the knee coming up.  No new injuries to his lower extremity.  ? ?Objective: ?AAO x3, NAD-wearing regular shoe ?DP/PT pulses palpable bilaterally, CRT less than 3 seconds ?There is decreased edema present to the right foot and ankle.  There is decreased tenderness palpation mostly on the third metatarsal.  No significant pain on exam today.  No significant pain to the ankle today but still gets some discomfort.  Flexor, extensor tendons appear to be intact.  No gross ankle instability present. ?No pain with calf compression, swelling, warmth, erythema ? ?Assessment: ?Right foot third metatarsal fracture, ankle sprain ? ?Plan: ?-All treatment options discussed with the patient including all alternatives, risks, complications.  ?-X-rays today reviewed 3 views of the foot were obtained.  There is increased consolidation noted across the fracture site of the third metatarsal.  No evidence of acute fracture. ?-For now continue with his regular shoe as he has been doing well with this.  I do think he will benefit from physical therapy but will wait for the left knee surgery prior to referral. ? ?Return in about 4 weeks (around 08/11/2021).  X-ray next appointment ? ?Trula Slade DPM ? ?

## 2021-08-27 ENCOUNTER — Encounter: Payer: Self-pay | Admitting: Podiatry

## 2021-08-31 ENCOUNTER — Other Ambulatory Visit: Payer: Self-pay | Admitting: Podiatry

## 2021-08-31 DIAGNOSIS — S93401D Sprain of unspecified ligament of right ankle, subsequent encounter: Secondary | ICD-10-CM

## 2021-08-31 DIAGNOSIS — S92334D Nondisplaced fracture of third metatarsal bone, right foot, subsequent encounter for fracture with routine healing: Secondary | ICD-10-CM

## 2021-08-31 NOTE — Progress Notes (Signed)
Sent PT referral  ?

## 2021-09-10 NOTE — Telephone Encounter (Signed)
Leah or Ammie- Can someone please refax the prescription for physical therapy to the appropriate physical therapy place?  Thank you.

## 2021-09-11 NOTE — Telephone Encounter (Signed)
Faxed PT referral to [facility], confirmation received 09/11/21.

## 2021-10-02 NOTE — Telephone Encounter (Signed)
Can you please schedule him a follow-up?

## 2021-10-06 ENCOUNTER — Other Ambulatory Visit: Payer: Self-pay | Admitting: Podiatry

## 2021-10-06 MED ORDER — ACETAMINOPHEN-CODEINE 300-30 MG PO TABS: 1.0000 | ORAL_TABLET | Freq: Four times a day (QID) | ORAL | 0 refills | Status: DC | PRN

## 2021-10-17 ENCOUNTER — Ambulatory Visit: Payer: Medicare Other | Admitting: Podiatry

## 2021-10-17 DIAGNOSIS — M7751 Other enthesopathy of right foot: Secondary | ICD-10-CM

## 2021-10-17 DIAGNOSIS — M7752 Other enthesopathy of left foot: Secondary | ICD-10-CM

## 2021-10-17 DIAGNOSIS — S93401D Sprain of unspecified ligament of right ankle, subsequent encounter: Secondary | ICD-10-CM

## 2021-10-17 MED ORDER — TRIAMCINOLONE ACETONIDE 10 MG/ML IJ SUSP
10.0000 mg | Freq: Once | INTRAMUSCULAR | Status: AC
  Administered 2021-10-17: 10 mg

## 2021-10-17 MED ORDER — PREGABALIN 75 MG PO CAPS: 75.0000 mg | ORAL_CAPSULE | Freq: Two times a day (BID) | ORAL | 1 refills | Status: DC

## 2021-10-17 MED ORDER — ACETAMINOPHEN-CODEINE 300-30 MG PO TABS: 1.0000 | ORAL_TABLET | Freq: Four times a day (QID) | ORAL | 0 refills | Status: DC | PRN

## 2021-10-17 NOTE — Progress Notes (Signed)
Subjective: 71 year old male presents the office today for follow-up evaluation of bilateral ankle discomfort.  He states on the right side he gets discomfort when he pushes off.  There is more to the lateral aspect and also to the top.  On the left ankle is becoming more consistent.  Points on the anteromedial aspect ankle joint weakness discomfort.  Describing numbness to both of his feet.  States at nighttime he has difficulty putting the bed sheets over his toes due to the discomfort.  No recent injury or changes otherwise.  Objective: AAO x3, NAD DP/PT pulses palpable bilaterally, CRT less than 3 seconds Left ankle: Tenderness palpation along anteromedial ankle joint.  There is no edema, erythema.  No crepitation ankle joint range of motion.  No area pinpoint tenderness.  Flexor, extensor tendons appear to be intact. Right ankle/foot: Mild discomfort along the lateral ankle area.  When he stands up in pushes off on the ball of his foot He Gets Discomfort.  There is an area midfoot which has been chronic.  No specific area pinpoint tenderness along the metatarsals particularly of the metatarsal with the fractures previously. No open lesions bilaterally No pain with calf compression, swelling, warmth, erythema  Assessment: Left ankle capsulitis; Right ankle sprain, capsulitis; neuropathy  Plan: -All treatment options discussed with the patient including all alternatives, risks, complications.  -Steroid injection performed the left side.  Skin was cleaned Betadine, alcohol.  Mixture 1 cc Kenalog 10, 0.5 cc of Marcaine plain, 0.5 cc of lidocaine plain was infiltrated into the intra medial ankle joint any complications.  Postinjection care discussed. -For the right side continue range of motion exercises as well as ankle brace.  We discussed steroid injection but would hold off on this today.  We will consider time. -Prescribed Lyrica -Tylenol 3 as needed. -Patient encouraged to call the office  with any questions, concerns, change in symptoms.   Trula Slade DPM

## 2021-10-17 NOTE — Patient Instructions (Signed)
Acetaminophen; Codeine Tablets What is this medication? ACETAMINOPHEN; CODEINE (a set a MEE noe fen; KOE deen) treats moderate pain. It is prescribed when other pain medications have not worked or cannot be tolerated. It works by blocking pain signals in the brain. This medication is a combination of acetaminophen and an opioid. This medicine may be used for other purposes; ask your health care provider or pharmacist if you have questions. COMMON BRAND NAME(S): Cocet, Cocet Plus, Tylenol with Codeine No.3, Tylenol with Codeine No.4, Vopac What should I tell my care team before I take this medication? They need to know if you have any of these conditions: Brain tumor Drug abuse or addiction Head injury Heart disease If you often drink alcohol Kidney disease Liver disease Low adrenal gland function Lung disease, asthma, or breathing problems Seizures Stomach or intestine problems Taken an MAOI like Marplan, Nardil, or Parnate in the last 14 days An unusual or allergic reaction to acetaminophen, codeine, other medications, foods, dyes, or preservatives Pregnant or trying to get pregnant Breast-feeding How should I use this medication? Take this medication by mouth with a full glass of water. Follow the directions on the prescription label. You can take it with or without food. If it upsets your stomach, take the medication with food. Do not take your medication more often than directed. A special MedGuide will be given to you by the pharmacist with each prescription and refill. Be sure to read this information carefully each time. Talk to your care team regarding the use of this medication in children. This medication is not for use in children less than 75 years of age. Do not give this medication to a child younger than 73 years of age after surgery to remove the tonsils and/or adenoids. Overdosage: If you think you have taken too much of this medicine contact a poison control center or  emergency room at once. NOTE: This medicine is only for you. Do not share this medicine with others. What if I miss a dose? If you miss a dose, take it as soon as you can. If it is almost time for your next dose, take only that dose. Do not take double or extra doses. What may interact with this medication? Do not take this medication with any of the following: Linezolid MAOIs like Marplan, Nardil, and Parnate Methylene blue Ozanimod Samidorphan This medication may also interact with the following: Alcohol Amiodarone Antihistamines for allergy, cough, and cold Atropine Certain antibiotics like clarithromycin, erythromycin, rifampin Certain antivirals for HIV or hepatitis Certain medications for anxiety or sleep Certain medications for bladder problems like oxybutynin, tolterodine Certain medications for depression like amitriptyline, bupropion, fluoxetine, paroxetine, sertraline, mirtazapine, trazodone Certain medications for fungal infections like ketoconazole, itraconazole, and posaconazole Certain medications for migraine headache like almotriptan, eletriptan, frovatriptan, naratriptan, rizatriptan, sumatriptan, zolmitriptan Certain medications for nausea or vomiting like dolasetron, granisetron, ondansetron, palonosetron Certain medications for Parkinson's disease like benztropine, trihexyphenidyl Certain medications for seizures like carbamazepine, phenobarbital, phenytoin, primidone Certain medications for stomach problems like dicyclomine, hyoscyamine Certain medications for travel sickness like scopolamine Diuretics General anesthetics like halothane, isoflurane, methoxyflurane, propofol Ipratropium Medications that relax muscles Other medications with acetaminophen Other narcotic medications for pain or cough Phenothiazines like chlorpromazine, mesoridazine, prochlorperazine, thioridazine Quinidine This list may not describe all possible interactions. Give your health  care provider a list of all the medicines, herbs, non-prescription drugs, or dietary supplements you use. Also tell them if you smoke, drink alcohol, or use illegal drugs. Some items may  interact with your medicine. What should I watch for while using this medication? Tell your care team if your pain does not go away, if it gets worse, or if you have new or a different type of pain. You may develop tolerance to this medication. Tolerance means that you will need a higher dose of the medication for pain relief. Tolerance is normal and is expected if you take this medication for a long time. There are different types of narcotic medications (opioids) for pain. If you take more than one type at the same time, you may have more side effects. Give your care team a list of all medications you use. They will tell you how much medication to take. Do not take more medication than directed. Call emergency services if you have problems breathing. Do not suddenly stop taking your medication because you may develop a severe reaction. Your body becomes used to the medication. This does NOT mean you are addicted. Addiction is a behavior related to getting and using a medication for a nonmedical reason. If you have pain, you have a medical reason to take pain medication. Your care team will tell you how much medication to take. If your care team wants you to stop the medication, the dose will be slowly lowered over time to avoid any side effects. Talk to your care team about naloxone and how to get it. Naloxone is an emergency medication used for an opioid overdose. An overdose can happen if you take too much opioid. It can also happen if an opioid is taken with some other medications or substances, like alcohol. Know the symptoms of an overdose, like trouble breathing, unusually tired or sleepy, or not being able to respond or wake up. Make sure to tell caregivers and close contacts where it is stored. Make sure they know how  to use it. After naloxone is given, you must call emergency services. Naloxone is a temporary treatment. Repeat doses may be needed. Children may be at higher risk for side effects. If your child has slow breathing, noisy breathing, confusion, or unusual sleepiness, stop giving this medication and get emergency help right away. Do not take other medications that contain acetaminophen with this medication. Many non-prescription medications contain acetaminophen. Always read labels carefully. If you have questions, ask your care team. If you take too much acetaminophen, get medical help right away. Too much acetaminophen can be very dangerous and cause liver damage. Even if you do not have symptoms, it is important to get help right away. You may get drowsy or dizzy. Do not drive, use machinery, or do anything that needs mental alertness until you know how this medication affects you. Do not stand up or sit up quickly, especially if you are an older patient. This reduces the risk of dizzy or fainting spells. Alcohol may interfere with the effect of this medication. Avoid alcoholic drinks. This medication will cause constipation. If you do not have a bowel movement for 3 days, call your care team. Your mouth may get dry. Chewing sugarless gum or sucking hard candy and drinking plenty of water may help. Contact your care team if the problem does not go away or is severe. What side effects may I notice from receiving this medication? Side effects that you should report to your care team as soon as possible: Allergic reactions--skin rash, itching, hives, swelling of the face, lips, tongue, or throat CNS depression--slow or shallow breathing, shortness of breath, feeling faint, dizziness, confusion, difficulty  staying awake Liver injury--right upper belly pain, loss of appetite, nausea, light-colored stool, dark yellow or brown urine, yellowing skin or eyes, unusual weakness or fatigue Low adrenal gland  function--nausea, vomiting, loss of appetite, unusual weakness or fatigue, dizziness Low blood pressure--dizziness, feeling faint or lightheaded, blurry vision Redness, blistering, peeling, or loosening of the skin, including inside the mouth Side effects that usually do not require medical attention (report to your care team if they continue or are bothersome): Constipation Dizziness Drowsiness Dry mouth Headache Nausea Trouble sleeping Upset stomach Vomiting This list may not describe all possible side effects. Call your doctor for medical advice about side effects. You may report side effects to FDA at 1-800-FDA-1088. Where should I keep my medication? Keep out of the reach of children and pets. This medication can be abused. Keep your medication in a safe place to protect it from theft. Do not share this medication with anyone. Selling or giving away this medication is dangerous and against the law. Store at room temperature between 15 and 30 degrees C (59 and 86 degrees F). This medication may cause accidental overdose and death if taken by other adults, children, or pets. Mix any unused medication with a substance like cat litter or coffee grounds. Then throw the medication away in a sealed container like a sealed bag or a coffee can with a lid. Do not use the medication after the expiration date. NOTE: This sheet is a summary. It may not cover all possible information. If you have questions about this medicine, talk to your doctor, pharmacist, or health care provider.  2023 Elsevier/Gold Standard (2020-04-17 00:00:00) Pregabalin Capsules What is this medication? PREGABALIN (pre GAB a lin) treats nerve pain. It may also be used to prevent and control seizures in people with epilepsy. It works by calming overactive nerves in your body. This medicine may be used for other purposes; ask your health care provider or pharmacist if you have questions. COMMON BRAND NAME(S): Lyrica What  should I tell my care team before I take this medication? They need to know if you have any of these conditions: Drug abuse or addiction Heart failure Kidney disease Lung disease Suicidal thoughts, plans or attempt An unusual or allergic reaction to pregabalin, other medications, foods, dyes, or preservatives Pregnant or trying to get pregnant Breast-feeding How should I use this medication? Take this medication by mouth with water. Take it as directed on the prescription label at the same time every day. You can take it with or without food. If it upsets your stomach, take it with food. Keep taking it unless your care team tells you to stop. A special MedGuide will be given to you by the pharmacist with each prescription and refill. Be sure to read this information carefully each time. Talk to your care team about the use of this medication in children. While it may be prescribed for children as young as 1 month for selected conditions, precautions do apply. Overdosage: If you think you have taken too much of this medicine contact a poison control center or emergency room at once. NOTE: This medicine is only for you. Do not share this medicine with others. What if I miss a dose? If you miss a dose, take it as soon as you can. If it is almost time for your next dose, take only that dose. Do not take double or extra doses. What may interact with this medication? Alcohol Antihistamines for allergy, cough, and cold Certain medications for anxiety  or sleep Certain medications for blood pressure, heart disease Certain medications for depression like amitriptyline, fluoxetine, sertraline Certain medications for diabetes, like pioglitazone, rosiglitazone Certain medications for seizures like phenobarbital, primidone General anesthetics like halothane, isoflurane, methoxyflurane, propofol Medications that relax muscles for surgery Narcotic medications for pain Phenothiazines like chlorpromazine,  mesoridazine, prochlorperazine, thioridazine This list may not describe all possible interactions. Give your health care provider a list of all the medicines, herbs, non-prescription drugs, or dietary supplements you use. Also tell them if you smoke, drink alcohol, or use illegal drugs. Some items may interact with your medicine. What should I watch for while using this medication? Visit your care team for regular checks on your progress. Tell your care team if your symptoms do not start to get better or if they get worse. Do not suddenly stop taking this medication. You may develop a severe reaction. Your care team will tell you how much medication to take. If your care team wants you to stop the medication, the dose may be slowly lowered over time to avoid any side effects. You may get drowsy or dizzy. Do not drive, use machinery, or do anything that needs mental alertness until you know how this medication affects you. Do not stand up or sit up quickly, especially if you are an older patient. This reduces the risk of dizzy or fainting spells. Alcohol may interfere with the effect of this medication. Avoid alcoholic drinks. If you or your family notice any changes in your behavior, such as new or worsening depression, thoughts of harming yourself, anxiety, other unusual or disturbing thoughts, or memory loss, call your care team right away. Wear a medical ID bracelet or chain if you are taking this medication for seizures. Carry a card that describes your condition. List the medications and doses you take on the card. This medication may make it more difficult to father a child. Talk to your care team if you are concerned about your fertility. What side effects may I notice from receiving this medication? Side effects that you should report to your care team as soon as possible: Allergic reactions or angioedema--skin rash, itching, hives, swelling of the face, eyes, lips, tongue, arms, or legs, trouble  swallowing or breathing Blurry vision Thoughts of suicide or self-harm, worsening mood, feelings of depression Trouble breathing Side effects that usually do not require medical attention (report to your care team if they continue or are bothersome): Dizziness Drowsiness Dry mouth Nausea Swelling of the ankles, feet, hands Vomiting Weight gain This list may not describe all possible side effects. Call your doctor for medical advice about side effects. You may report side effects to FDA at 1-800-FDA-1088. Where should I keep my medication? Keep out of the reach of children and pets. This medication can be abused. Keep it in a safe place to protect it from theft. Do not share it with anyone. It is only for you. Selling or giving away this medication is dangerous and against the law. Store at [employer] C (77 degrees F). Get rid of any unused medication after the expiration date. This medication may cause harm and death if it is taken by other adults, children, or pets. It is important to get rid of the medication as soon as you no longer need it, or it is expired. You can do this in two ways: Take the medication to a medication take-back program. Check with your pharmacy or law enforcement to find a location. If you cannot return the  medication, check the label or package insert to see if the medication should be thrown out in the garbage or flushed down the toilet. If you are not sure, ask your care team. If it is safe to put it in the trash, take the medication out of the container. Mix the medication with cat litter, dirt, coffee grounds, or other unwanted substance. Seal the mixture in a bag or container. Put it in the trash. NOTE: This sheet is a summary. It may not cover all possible information. If you have questions about this medicine, talk to your doctor, pharmacist, or health care provider.  2023 Elsevier/Gold Standard (2020-04-09 00:00:00)

## 2021-11-23 ENCOUNTER — Ambulatory Visit (INDEPENDENT_AMBULATORY_CARE_PROVIDER_SITE_OTHER): Payer: Medicare Other | Admitting: Internal Medicine

## 2021-11-23 ENCOUNTER — Ambulatory Visit (INDEPENDENT_AMBULATORY_CARE_PROVIDER_SITE_OTHER): Payer: Medicare Other

## 2021-11-23 ENCOUNTER — Encounter: Payer: Self-pay | Admitting: Internal Medicine

## 2021-11-23 VITALS — BP 120/68 | HR 83 | Temp 98.0°F | Resp 16 | Wt 170.0 lb

## 2021-11-23 DIAGNOSIS — Z720 Tobacco use: Secondary | ICD-10-CM

## 2021-11-23 DIAGNOSIS — Z23 Encounter for immunization: Secondary | ICD-10-CM

## 2021-11-23 DIAGNOSIS — J432 Centrilobular emphysema: Secondary | ICD-10-CM | POA: Diagnosis not present

## 2021-11-23 DIAGNOSIS — E890 Postprocedural hypothyroidism: Secondary | ICD-10-CM

## 2021-11-23 DIAGNOSIS — R972 Elevated prostate specific antigen [PSA]: Secondary | ICD-10-CM | POA: Diagnosis not present

## 2021-11-23 DIAGNOSIS — J449 Chronic obstructive pulmonary disease, unspecified: Secondary | ICD-10-CM

## 2021-11-23 DIAGNOSIS — E785 Hyperlipidemia, unspecified: Secondary | ICD-10-CM

## 2021-11-23 DIAGNOSIS — Z1211 Encounter for screening for malignant neoplasm of colon: Secondary | ICD-10-CM

## 2021-11-23 DIAGNOSIS — E119 Type 2 diabetes mellitus without complications: Secondary | ICD-10-CM | POA: Insufficient documentation

## 2021-11-23 DIAGNOSIS — E1149 Type 2 diabetes mellitus with other diabetic neurological complication: Secondary | ICD-10-CM

## 2021-11-23 DIAGNOSIS — J4489 Other specified chronic obstructive pulmonary disease: Secondary | ICD-10-CM

## 2021-11-23 DIAGNOSIS — R051 Acute cough: Secondary | ICD-10-CM

## 2021-11-23 DIAGNOSIS — N400 Enlarged prostate without lower urinary tract symptoms: Secondary | ICD-10-CM

## 2021-11-23 DIAGNOSIS — Z0000 Encounter for general adult medical examination without abnormal findings: Secondary | ICD-10-CM | POA: Diagnosis not present

## 2021-11-23 DIAGNOSIS — R052 Subacute cough: Secondary | ICD-10-CM | POA: Insufficient documentation

## 2021-11-23 DIAGNOSIS — J22 Unspecified acute lower respiratory infection: Secondary | ICD-10-CM

## 2021-11-23 LAB — HEPATIC FUNCTION PANEL
ALT: 22 U/L (ref 0–53)
AST: 19 U/L (ref 0–37)
Albumin: 4 g/dL (ref 3.5–5.2)
Alkaline Phosphatase: 66 U/L (ref 39–117)
Bilirubin, Direct: 0.1 mg/dL (ref 0.0–0.3)
Total Bilirubin: 0.3 mg/dL (ref 0.2–1.2)
Total Protein: 5.9 g/dL — ABNORMAL LOW (ref 6.0–8.3)

## 2021-11-23 LAB — CBC WITH DIFFERENTIAL/PLATELET
Basophils Absolute: 0.1 10*3/uL (ref 0.0–0.1)
Basophils Relative: 0.6 % (ref 0.0–3.0)
Eosinophils Absolute: 0.1 10*3/uL (ref 0.0–0.7)
Eosinophils Relative: 1.4 % (ref 0.0–5.0)
HCT: 39.6 % (ref 39.0–52.0)
Hemoglobin: 13.4 g/dL (ref 13.0–17.0)
Lymphocytes Relative: 13.4 % (ref 12.0–46.0)
Lymphs Abs: 1.2 10*3/uL (ref 0.7–4.0)
MCHC: 33.8 g/dL (ref 30.0–36.0)
MCV: 88.9 fl (ref 78.0–100.0)
Monocytes Absolute: 0.7 10*3/uL (ref 0.1–1.0)
Monocytes Relative: 8 % (ref 3.0–12.0)
Neutro Abs: 6.8 10*3/uL (ref 1.4–7.7)
Neutrophils Relative %: 76.6 % (ref 43.0–77.0)
Platelets: 207 10*3/uL (ref 150.0–400.0)
RBC: 4.45 Mil/uL (ref 4.22–5.81)
RDW: 14.7 % (ref 11.5–15.5)
WBC: 8.9 10*3/uL (ref 4.0–10.5)

## 2021-11-23 LAB — LIPID PANEL
Cholesterol: 107 mg/dL (ref 0–200)
HDL: 40.6 mg/dL (ref >39.00)
LDL Cholesterol: 52 mg/dL (ref 0–99)
NonHDL: 66.84
Total CHOL/HDL Ratio: 3
Triglycerides: 76 mg/dL (ref 0.0–149.0)
VLDL: 15.2 mg/dL (ref 0.0–40.0)

## 2021-11-23 LAB — MICROALBUMIN / CREATININE URINE RATIO
Creatinine,U: 167.5 mg/dL
Microalb Creat Ratio: 1.4 mg/g (ref 0.0–30.0)
Microalb, Ur: 2.4 mg/dL — ABNORMAL HIGH (ref 0.0–1.9)

## 2021-11-23 LAB — URINALYSIS, ROUTINE W REFLEX MICROSCOPIC
Bilirubin Urine: NEGATIVE
Hgb urine dipstick: NEGATIVE
Ketones, ur: NEGATIVE
Leukocytes,Ua: NEGATIVE
Nitrite: NEGATIVE
Specific Gravity, Urine: 1.025 (ref 1.000–1.030)
Total Protein, Urine: NEGATIVE
Urine Glucose: NEGATIVE
Urobilinogen, UA: 0.2 (ref 0.0–1.0)
pH: 6 (ref 5.0–8.0)

## 2021-11-23 LAB — BASIC METABOLIC PANEL
BUN: 18 mg/dL (ref 6–23)
CO2: 26 mEq/L (ref 19–32)
Calcium: 8.1 mg/dL — ABNORMAL LOW (ref 8.4–10.5)
Chloride: 104 mEq/L (ref 96–112)
Creatinine, Ser: 1.23 mg/dL (ref 0.40–1.50)
GFR: 60.18 mL/min (ref >60.00)
Glucose, Bld: 98 mg/dL (ref 70–99)
Potassium: 4.5 mEq/L (ref 3.5–5.1)
Sodium: 140 mEq/L (ref 135–145)

## 2021-11-23 LAB — HEMOGLOBIN A1C: Hgb A1c MFr Bld: 7.3 % — ABNORMAL HIGH (ref 4.6–6.5)

## 2021-11-23 LAB — TSH: TSH: 5.61 u[IU]/mL — ABNORMAL HIGH (ref 0.35–5.50)

## 2021-11-23 LAB — POC COVID19 BINAXNOW: SARS Coronavirus 2 Ag: NEGATIVE

## 2021-11-23 LAB — PSA: PSA: 6.2 ng/mL — ABNORMAL HIGH (ref 0.10–4.00)

## 2021-11-23 MED ORDER — TRELEGY ELLIPTA 100-62.5-25 MCG/ACT IN AEPB: 1.0000 | INHALATION_SPRAY | Freq: Every day | RESPIRATORY_TRACT | 1 refills | Status: DC

## 2021-11-23 MED ORDER — AMOXICILLIN-POT CLAVULANATE 875-125 MG PO TABS: 1.0000 | ORAL_TABLET | Freq: Two times a day (BID) | ORAL | 0 refills | Status: AC

## 2021-11-23 NOTE — Progress Notes (Signed)
Subjective:  Patient ID: Lee Mitchell, male    DOB: July 06, 1950  Age: 71 y.o. MRN: [id number]  CC: Annual Exam, COPD, Cough, Hyperlipidemia, Hypothyroidism, Diabetes, and Coronary Artery Disease   HPI MURDOCK JELLISON presents for a CPX and to re-establish.  He complains of a 2-day history of cough productive of thick tan phlegm with shortness of breath and chills but no fever.  He feels a burning sensation in his chest. He denies hemoptysis or chest pain.  He tells me he has had an extensive cardiovascular workup elsewhere.  He is active and denies diaphoresis, edema, or palpitations.  History Rayshard has a past medical history of Chronic abdominal pain, Colon polyps, PNA (pneumonia), and Thyroid cancer ([place]).   He has a past surgical history that includes Thyroidectomy; Colonoscopy; Cholecystectomy; Ankle arthroscopy with reconstruction; Elbow arthroscopy with tendon reconstruction; Ulnar nerve transposition; Shoulder arthroscopy with labral repair; and Back surgery.   His family history includes Cancer (age of onset: 70) in his mother; Hypertension in his father and mother; Hyperthyroidism in his mother; Kidney cancer in his father; Thyroid cancer in his maternal uncle.He reports that he has been smoking cigarettes. He has a 8.75 pack-year smoking history. He has never used smokeless tobacco. He reports that he does not currently use alcohol. He reports current drug use. Drug: Marijuana.  Outpatient Medications Prior to Visit  Medication Sig Dispense Refill   diclofenac Sodium (VOLTAREN) 1 % GEL Apply 2 g topically 4 (four) times daily. Rub into affected area of foot 2 to 4 times daily 100 g 2   levothyroxine (SYNTHROID) 150 MCG tablet Take 150 mcg by mouth daily.     levothyroxine (SYNTHROID) 175 MCG tablet TAKE 1 TABLET (175 MCG TOTAL) BY MOUTH DAILY BEFORE BREAKFAST. 90 tablet 1   mirtazapine (REMERON SOL-TAB) 30 MG disintegrating tablet DISSOLVE 1 TABLET UNDER TONGUE AT BEDTIME  (Patient taking differently: Take 30 mg by mouth at bedtime. Dissolve 1 tablet ('30mg'$ ) under tongue at bedtime) 90 tablet 0   omeprazole (PRILOSEC) 40 MG capsule Take by mouth.     pregabalin (LYRICA) 75 MG capsule Take 1 capsule (75 mg total) by mouth 2 (two) times daily. 60 capsule 1   rosuvastatin (CRESTOR) 40 MG tablet Take 40 mg by mouth at bedtime.      ibuprofen (ADVIL) 200 MG tablet Take 200 mg by mouth every 6 (six) hours as needed for headache or mild pain.     metFORMIN (GLUCOPHAGE) 500 MG tablet Take 500 mg by mouth daily with breakfast.     albuterol (VENTOLIN HFA) 108 (90 Base) MCG/ACT inhaler Inhale 2 puffs into the lungs every 6 (six) hours as needed for wheezing or shortness of breath.     ezetimibe (ZETIA) 10 MG tablet Take by mouth.     acetaminophen-codeine (TYLENOL #3) 300-30 MG tablet Take 1 tablet by mouth every 6 (six) hours as needed for moderate pain. (Patient not taking: Reported on 11/23/2021) 15 tablet 0   ALPRAZolam (XANAX) 0.25 MG tablet Take 1-2 tabs (0.'25mg'$ -0.'50mg'$ ) 30-60 minutes before procedure. May repeat if needed.Do not drive. 4 tablet 0   mirtazapine (REMERON) 30 MG tablet SMARTSIG:1 Tablet(s) By Mouth Every Evening (Patient not taking: Reported on 11/23/2021)     NON FORMULARY [place] cream-#14     No facility-administered medications prior to visit.    ROS Review of Systems  Constitutional:  Positive for chills. Negative for appetite change, diaphoresis, fatigue and fever.  HENT: Negative.  Respiratory:  Positive for cough and shortness of breath. Negative for chest tightness and wheezing.   Cardiovascular:  Negative for chest pain, palpitations and leg swelling.  Gastrointestinal:  Positive for constipation. Negative for abdominal pain, nausea and vomiting.  Endocrine: Negative.   Genitourinary: Negative.  Negative for difficulty urinating.  Musculoskeletal:  Positive for arthralgias. Negative for back pain and myalgias.  Skin:  Negative.  Negative for rash.  Allergic/Immunologic: Negative.   Neurological: Negative.  Negative for dizziness and light-headedness.  Hematological:  Negative for adenopathy. Does not bruise/bleed easily.  Psychiatric/Behavioral: Negative.      Objective:  BP 120/68 (BP Location: Right Arm, Patient Position: Sitting, Cuff Size: Large)   Pulse 83   Temp 98 F (36.7 C) (Oral)   SpO2 93%   Physical Exam Vitals reviewed.  Constitutional:      General: He is not in acute distress.    Appearance: He is not ill-appearing, toxic-appearing or diaphoretic.  HENT:     Mouth/Throat:     Mouth: Mucous membranes are moist.  Eyes:     General: No scleral icterus.    Conjunctiva/sclera: Conjunctivae normal.  Cardiovascular:     Rate and Rhythm: Normal rate and regular rhythm.     Heart sounds: No murmur heard. Pulmonary:     Effort: Pulmonary effort is normal. No respiratory distress.     Breath sounds: No stridor. Examination of the right-upper field reveals rhonchi. Examination of the left-upper field reveals rhonchi. Examination of the right-middle field reveals rhonchi. Examination of the left-middle field reveals rhonchi. Examination of the right-lower field reveals rhonchi. Examination of the left-lower field reveals rhonchi. Rhonchi present. No decreased breath sounds, wheezing or rales.  Chest:     Chest wall: No tenderness.  Abdominal:     General: Abdomen is flat.     Palpations: There is no mass.     Tenderness: There is no abdominal tenderness. There is no guarding or rebound.     Hernia: No hernia is present.  Musculoskeletal:        General: Normal range of motion.     Cervical back: Neck supple.  Lymphadenopathy:     Cervical: No cervical adenopathy.  Skin:    General: Skin is warm and dry.  Neurological:     General: No focal deficit present.     Mental Status: He is alert. Mental status is at baseline.  Psychiatric:        Mood and Affect: Mood normal.         Behavior: Behavior normal.     Lab Results  Component Value Date   WBC 8.9 11/23/2021   HGB 13.4 11/23/2021   HCT 39.6 11/23/2021   PLT 207.0 11/23/2021   GLUCOSE 98 11/23/2021   CHOL 107 11/23/2021   TRIG 76.0 11/23/2021   HDL 40.60 11/23/2021   LDLCALC 52 11/23/2021   ALT 22 11/23/2021   AST 19 11/23/2021   NA 140 11/23/2021   K 4.5 11/23/2021   CL 104 11/23/2021   CREATININE 1.23 11/23/2021   BUN 18 11/23/2021   CO2 26 11/23/2021   TSH 5.61 (H) 11/23/2021   PSA 6.20 (H) 11/23/2021   INR 0.9 06/24/2019   HGBA1C 7.3 (H) 11/23/2021   MICROALBUR 2.4 (H) 11/23/2021     DG Chest 2 View  Result Date: 11/23/2021 CLINICAL DATA:  Productive cough for 3-4 days. EXAM: CHEST - 2 VIEW COMPARISON:  None Available. FINDINGS: Normal cardiac and mediastinal contours. No large area  pulmonary consolidation. No pleural effusion or pneumothorax. Thoracic spine degenerative changes. IMPRESSION: No active cardiopulmonary disease. Electronically Signed   By: Lovey Newcomer M.D.   On: 11/23/2021 10:33     Assessment & Plan:   Suhas was seen today for annual exam, copd, cough, hyperlipidemia, hypothyroidism, diabetes and coronary artery disease.  Diagnoses and all orders for this visit:  Acute cough- Chest x-ray is negative for mass or infiltrate.  COVID testing is negative.  Will treat for bacterial respiratory infection with Augmentin. -     DG Chest 2 View; Future -     CBC with Differential/Platelet; Future -     POC COVID-19 -     CBC with Differential/Platelet  Type 2 diabetes mellitus with other neurologic complication, without long-term current use of insulin ([place])- His A1c is at 7.3%.  Will treat with a combination of metformin and an SGLT2 inhibitor. -     Basic metabolic panel; Future -     Hemoglobin A1c; Future -     Microalbumin / creatinine urine ratio; Future -     HM Diabetes Foot Exam -     Microalbumin / creatinine urine ratio -     Hemoglobin A1c -     Basic metabolic  panel -     Dapagliflozin-metFORMIN HCl ER (XIGDUO XR) 10-500 MG TB24; Take 1 tablet by mouth daily. -     Ambulatory referral to Ophthalmology  Hyperlipidemia with target LDL less than 100- LDL goal achieved. Doing well on the statin  -     Lipid panel; Future -     TSH; Future -     Hepatic function panel; Future -     Hepatic function panel -     TSH -     Lipid panel  Postoperative hypothyroidism- He is euthyroid. -     TSH; Future -     CBC with Differential/Platelet; Future -     CBC with Differential/Platelet -     TSH  Centrilobular emphysema ([place])- Will treat with a LABA/LAMA/ICS inhaler. -     CBC with Differential/Platelet; Future -     Ambulatory Referral for Lung Cancer Scre -     CBC with Differential/Platelet -     Fluticasone-Umeclidin-Vilant (TRELEGY ELLIPTA) 100-62.5-25 MCG/ACT AEPB; Inhale 1 puff into the lungs daily.  PSA elevation -     Urinalysis, Routine w reflex microscopic; Future -     PSA; Future -     PSA -     Urinalysis, Routine w reflex microscopic -     Ambulatory referral to Urology  Benign prostatic hyperplasia without lower urinary tract symptoms -     Urinalysis, Routine w reflex microscopic; Future -     PSA; Future -     PSA -     Urinalysis, Routine w reflex microscopic  LRTI (lower respiratory tract infection) -     amoxicillin-clavulanate (AUGMENTIN) 875-125 MG tablet; Take 1 tablet by mouth 2 (two) times daily for 7 days.  Tobacco abuse -     Ambulatory Referral for Lung Cancer Scre  Routine general medical examination at a health care facility- Exam completed, labs reviewed, vaccines reviewed and updated, cancer screenings addressed, patient education was given.  COPD (chronic obstructive pulmonary disease) with chronic bronchitis ([place]) -     Fluticasone-Umeclidin-Vilant (TRELEGY ELLIPTA) 100-62.5-25 MCG/ACT AEPB; Inhale 1 puff into the lungs daily.  Screen for colon cancer -     Cologuard  Need for prophylactic vaccination  and inoculation against varicella -     Zoster Vaccine Adjuvanted [facility]) injection; Inject 0.5 mLs into the muscle once for 1 dose.   I have discontinued Honor Loh. Hugh's metFORMIN, ibuprofen, NON FORMULARY, ALPRAZolam, and acetaminophen-codeine. I am also having him start on amoxicillin-clavulanate, Trelegy Ellipta, Xigduo XR, and Shingrix. Additionally, I am having him maintain his levothyroxine, mirtazapine, albuterol, rosuvastatin, ezetimibe, levothyroxine, diclofenac Sodium, pregabalin, and omeprazole.  Meds ordered this encounter  Medications   amoxicillin-clavulanate (AUGMENTIN) 875-125 MG tablet    Sig: Take 1 tablet by mouth 2 (two) times daily for 7 days.    Dispense:  14 tablet    Refill:  0   Fluticasone-Umeclidin-Vilant (TRELEGY ELLIPTA) 100-62.5-25 MCG/ACT AEPB    Sig: Inhale 1 puff into the lungs daily.    Dispense:  120 each    Refill:  1   Dapagliflozin-metFORMIN HCl ER (XIGDUO XR) 10-500 MG TB24    Sig: Take 1 tablet by mouth daily.    Dispense:  90 tablet    Refill:  1   Zoster Vaccine Adjuvanted [facility]) injection    Sig: Inject 0.5 mLs into the muscle once for 1 dose.    Dispense:  0.5 mL    Refill:  1     Follow-up: Return in about 3 months (around 02/23/2022).  Scarlette Calico, MD

## 2021-11-23 NOTE — Patient Instructions (Signed)
Health Maintenance, Male Adopting a healthy lifestyle and getting preventive care are important in promoting health and wellness. Ask your health care provider about: The right schedule for you to have regular tests and exams. Things you can do on your own to prevent diseases and keep yourself healthy. What should I know about diet, weight, and exercise? Eat a healthy diet  Eat a diet that includes plenty of vegetables, fruits, low-fat dairy products, and lean protein. Do not eat a lot of foods that are high in solid fats, added sugars, or sodium. Maintain a healthy weight Body mass index (BMI) is a measurement that can be used to identify possible weight problems. It estimates body fat based on height and weight. Your health care provider can help determine your BMI and help you achieve or maintain a healthy weight. Get regular exercise Get regular exercise. This is one of the most important things you can do for your health. Most adults should: Exercise for at least 150 minutes each week. The exercise should increase your heart rate and make you sweat (moderate-intensity exercise). Do strengthening exercises at least twice a week. This is in addition to the moderate-intensity exercise. Spend less time sitting. Even light physical activity can be beneficial. Watch cholesterol and blood lipids Have your blood tested for lipids and cholesterol at 69 years of age, then have this test every 5 years. You may need to have your cholesterol levels checked more often if: Your lipid or cholesterol levels are high. You are older than 69 years of age. You are at high risk for heart disease. What should I know about cancer screening? Many types of cancers can be detected early and may often be prevented. Depending on your health history and family history, you may need to have cancer screening at various ages. This may include screening for: Colorectal cancer. Prostate cancer. Skin cancer. Lung  cancer. What should I know about heart disease, diabetes, and high blood pressure? Blood pressure and heart disease High blood pressure causes heart disease and increases the risk of stroke. This is more likely to develop in people who have high blood pressure readings or are overweight. Talk with your health care provider about your target blood pressure readings. Have your blood pressure checked: Every 3-5 years if you are 18-39 years of age. Every year if you are 40 years old or older. If you are between the ages of 65 and 75 and are a current or former smoker, ask your health care provider if you should have a one-time screening for abdominal aortic aneurysm (AAA). Diabetes Have regular diabetes screenings. This checks your fasting blood sugar level. Have the screening done: Once every three years after age 45 if you are at a normal weight and have a low risk for diabetes. More often and at a younger age if you are overweight or have a high risk for diabetes. What should I know about preventing infection? Hepatitis B If you have a higher risk for hepatitis B, you should be screened for this virus. Talk with your health care provider to find out if you are at risk for hepatitis B infection. Hepatitis C Blood testing is recommended for: Everyone born from 1945 through 1965. Anyone with known risk factors for hepatitis C. Sexually transmitted infections (STIs) You should be screened each year for STIs, including gonorrhea and chlamydia, if: You are sexually active and are younger than 69 years of age. You are older than 69 years of age and your   health care provider tells you that you are at risk for this type of infection. Your sexual activity has changed since you were last screened, and you are at increased risk for chlamydia or gonorrhea. Ask your health care provider if you are at risk. Ask your health care provider about whether you are at high risk for HIV. Your health care provider  may recommend a prescription medicine to help prevent HIV infection. If you choose to take medicine to prevent HIV, you should first get tested for HIV. You should then be tested every 3 months for as long as you are taking the medicine. Follow these instructions at home: Alcohol use Do not drink alcohol if your health care provider tells you not to drink. If you drink alcohol: Limit how much you have to 0-2 drinks a day. Know how much alcohol is in your drink. In the [place] one drink equals one 12 oz bottle of beer (355 mL), one 5 oz glass of wine (148 mL), or one 1 oz glass of hard liquor (44 mL). Lifestyle Do not use any products that contain nicotine or tobacco. These products include cigarettes, chewing tobacco, and vaping devices, such as e-cigarettes. If you need help quitting, ask your health care provider. Do not use street drugs. Do not share needles. Ask your health care provider for help if you need support or information about quitting drugs. General instructions Schedule regular health, dental, and eye exams. Stay current with your vaccines. Tell your health care provider if: You often feel depressed. You have ever been abused or do not feel safe at home. Summary Adopting a healthy lifestyle and getting preventive care are important in promoting health and wellness. Follow your health care provider's instructions about healthy diet, exercising, and getting tested or screened for diseases. Follow your health care provider's instructions on monitoring your cholesterol and blood pressure. This information is not intended to replace advice given to you by your health care provider. Make sure you discuss any questions you have with your health care provider. Document Revised: 08/25/2020 Document Reviewed: 08/25/2020 Elsevier Patient Education  2023 [employer]  

## 2021-11-24 MED ORDER — XIGDUO XR 10-500 MG PO TB24: 1.0000 | ORAL_TABLET | Freq: Every day | ORAL | 1 refills | Status: DC

## 2021-11-25 ENCOUNTER — Encounter: Payer: Self-pay | Admitting: Internal Medicine

## 2021-11-27 ENCOUNTER — Encounter: Payer: Self-pay | Admitting: Internal Medicine

## 2021-11-27 MED ORDER — SHINGRIX 50 MCG/0.5ML IM SUSR: 0.5000 mL | Freq: Once | INTRAMUSCULAR | 1 refills | Status: AC

## 2021-12-01 ENCOUNTER — Other Ambulatory Visit: Payer: Self-pay | Admitting: Internal Medicine

## 2021-12-01 DIAGNOSIS — K219 Gastro-esophageal reflux disease without esophagitis: Secondary | ICD-10-CM

## 2021-12-01 DIAGNOSIS — E890 Postprocedural hypothyroidism: Secondary | ICD-10-CM

## 2021-12-03 ENCOUNTER — Other Ambulatory Visit: Payer: Self-pay | Admitting: Internal Medicine

## 2021-12-07 ENCOUNTER — Other Ambulatory Visit: Payer: Self-pay | Admitting: Internal Medicine

## 2021-12-07 DIAGNOSIS — J432 Centrilobular emphysema: Secondary | ICD-10-CM

## 2021-12-07 DIAGNOSIS — E785 Hyperlipidemia, unspecified: Secondary | ICD-10-CM

## 2021-12-07 DIAGNOSIS — I2510 Atherosclerotic heart disease of native coronary artery without angina pectoris: Secondary | ICD-10-CM

## 2021-12-08 LAB — COLOGUARD: Cologuard: POSITIVE — AB

## 2021-12-10 ENCOUNTER — Other Ambulatory Visit: Payer: Self-pay | Admitting: Internal Medicine

## 2021-12-10 DIAGNOSIS — R195 Other fecal abnormalities: Secondary | ICD-10-CM | POA: Insufficient documentation

## 2021-12-10 LAB — COLOGUARD: COLOGUARD: POSITIVE — AB

## 2021-12-29 ENCOUNTER — Emergency Department (HOSPITAL_COMMUNITY): Payer: Medicare Other

## 2021-12-29 ENCOUNTER — Other Ambulatory Visit: Payer: Self-pay

## 2021-12-29 ENCOUNTER — Emergency Department (HOSPITAL_COMMUNITY)
Admission: EM | Admit: 2021-12-29 | Discharge: 2021-12-29 | Disposition: A | Discharge status: Home / Self Care | Payer: Medicare Other | Attending: Emergency Medicine | Admitting: Emergency Medicine

## 2021-12-29 DIAGNOSIS — J439 Emphysema, unspecified: Secondary | ICD-10-CM | POA: Diagnosis not present

## 2021-12-29 DIAGNOSIS — Z20822 Contact with and (suspected) exposure to covid-19: Secondary | ICD-10-CM | POA: Insufficient documentation

## 2021-12-29 DIAGNOSIS — R569 Unspecified convulsions: Secondary | ICD-10-CM | POA: Diagnosis not present

## 2021-12-29 DIAGNOSIS — R0600 Dyspnea, unspecified: Secondary | ICD-10-CM | POA: Diagnosis not present

## 2021-12-29 DIAGNOSIS — D72829 Elevated white blood cell count, unspecified: Secondary | ICD-10-CM | POA: Diagnosis not present

## 2021-12-29 DIAGNOSIS — Z85038 Personal history of other malignant neoplasm of large intestine: Secondary | ICD-10-CM | POA: Diagnosis not present

## 2021-12-29 DIAGNOSIS — E119 Type 2 diabetes mellitus without complications: Secondary | ICD-10-CM | POA: Insufficient documentation

## 2021-12-29 DIAGNOSIS — R55 Syncope and collapse: Secondary | ICD-10-CM | POA: Insufficient documentation

## 2021-12-29 DIAGNOSIS — J449 Chronic obstructive pulmonary disease, unspecified: Secondary | ICD-10-CM | POA: Insufficient documentation

## 2021-12-29 DIAGNOSIS — R7989 Other specified abnormal findings of blood chemistry: Secondary | ICD-10-CM | POA: Diagnosis not present

## 2021-12-29 DIAGNOSIS — Z86718 Personal history of other venous thrombosis and embolism: Secondary | ICD-10-CM | POA: Insufficient documentation

## 2021-12-29 DIAGNOSIS — Z85850 Personal history of malignant neoplasm of thyroid: Secondary | ICD-10-CM | POA: Insufficient documentation

## 2021-12-29 DIAGNOSIS — R81 Glycosuria: Secondary | ICD-10-CM | POA: Diagnosis not present

## 2021-12-29 LAB — RAPID URINE DRUG SCREEN, HOSP PERFORMED
Amphetamines: NOT DETECTED
Barbiturates: NOT DETECTED
Benzodiazepines: NOT DETECTED
Cocaine: NOT DETECTED
Opiates: NOT DETECTED
Tetrahydrocannabinol: POSITIVE — AB

## 2021-12-29 LAB — CBC WITH DIFFERENTIAL/PLATELET
Abs Immature Granulocytes: 0.09 10*3/uL — ABNORMAL HIGH (ref 0.00–0.07)
Basophils Absolute: 0.1 10*3/uL (ref 0.0–0.1)
Basophils Relative: 1 %
Eosinophils Absolute: 0.1 10*3/uL (ref 0.0–0.5)
Eosinophils Relative: 0 %
HCT: 46 % (ref 39.0–52.0)
Hemoglobin: 15.5 g/dL (ref 13.0–17.0)
Immature Granulocytes: 1 %
Lymphocytes Relative: 11 %
Lymphs Abs: 2 10*3/uL (ref 0.7–4.0)
MCH: 29.9 pg (ref 26.0–34.0)
MCHC: 33.7 g/dL (ref 30.0–36.0)
MCV: 88.8 fL (ref 80.0–100.0)
Monocytes Absolute: 0.9 10*3/uL (ref 0.1–1.0)
Monocytes Relative: 5 %
Neutro Abs: 15.2 10*3/uL — ABNORMAL HIGH (ref 1.7–7.7)
Neutrophils Relative %: 82 %
Platelets: 260 10*3/uL (ref 150–400)
RBC: 5.18 MIL/uL (ref 4.22–5.81)
RDW: 13.6 % (ref 11.5–15.5)
WBC: 18.4 10*3/uL — ABNORMAL HIGH (ref 4.0–10.5)
nRBC: 0 % (ref 0.0–0.2)

## 2021-12-29 LAB — URINALYSIS, ROUTINE W REFLEX MICROSCOPIC
Bilirubin Urine: NEGATIVE
Glucose, UA: >=500 mg/dL — AB
Hgb urine dipstick: NEGATIVE
Ketones, ur: 5 mg/dL — AB
Leukocytes,Ua: NEGATIVE
Nitrite: NEGATIVE
Protein, ur: 30 mg/dL — AB
Specific Gravity, Urine: 1.023 (ref 1.005–1.030)
pH: 5 (ref 5.0–8.0)

## 2021-12-29 LAB — LACTIC ACID, PLASMA: Lactic Acid, Venous: 1.4 mmol/L (ref 0.5–1.9)

## 2021-12-29 LAB — TROPONIN I (HIGH SENSITIVITY)
Troponin I (High Sensitivity): 13 ng/L (ref <18)
Troponin I (High Sensitivity): 15 ng/L (ref <18)

## 2021-12-29 LAB — COMPREHENSIVE METABOLIC PANEL
ALT: 36 U/L (ref 0–44)
AST: 32 U/L (ref 15–41)
Albumin: 3.5 g/dL (ref 3.5–5.0)
Alkaline Phosphatase: 57 U/L (ref 38–126)
Anion gap: 11 (ref 5–15)
BUN: 16 mg/dL (ref 8–23)
CO2: 20 mmol/L — ABNORMAL LOW (ref 22–32)
Calcium: 8.4 mg/dL — ABNORMAL LOW (ref 8.9–10.3)
Chloride: 109 mmol/L (ref 98–111)
Creatinine, Ser: 1.43 mg/dL — ABNORMAL HIGH (ref 0.61–1.24)
GFR, Estimated: 53 mL/min — ABNORMAL LOW (ref >60)
Glucose, Bld: 105 mg/dL — ABNORMAL HIGH (ref 70–99)
Potassium: 4.4 mmol/L (ref 3.5–5.1)
Sodium: 140 mmol/L (ref 135–145)
Total Bilirubin: 0.7 mg/dL (ref 0.3–1.2)
Total Protein: 5.5 g/dL — ABNORMAL LOW (ref 6.5–8.1)

## 2021-12-29 LAB — D-DIMER, QUANTITATIVE: D-Dimer, Quant: 1.04 ug/mL-FEU — ABNORMAL HIGH (ref 0.00–0.50)

## 2021-12-29 LAB — RESP PANEL BY RT-PCR (FLU A&B, COVID) ARPGX2
Influenza A by PCR: NEGATIVE
Influenza B by PCR: NEGATIVE
SARS Coronavirus 2 by RT PCR: NEGATIVE

## 2021-12-29 MED ORDER — IOHEXOL 350 MG/ML SOLN
80.0000 mL | Freq: Once | INTRAVENOUS | Status: AC | PRN
  Administered 2021-12-29: 80 mL via INTRAVENOUS

## 2021-12-29 MED ORDER — SODIUM CHLORIDE 0.9 % IV BOLUS
1000.0000 mL | Freq: Once | INTRAVENOUS | Status: AC
  Administered 2021-12-29: 1000 mL via INTRAVENOUS

## 2021-12-29 NOTE — ED Triage Notes (Addendum)
Pt went into store "I could not walk, breathe well, I was sweating bullets and then had chills on top of it. I had a hard time getting words out but I think that is because I was focused on my breathing. I also had a hard time seeing outside it was really bright." Pt's friend bought pt in.

## 2021-12-29 NOTE — ED Provider Notes (Signed)
[facility] EMERGENCY DEPARTMENT Provider Note   CSN: [id number] Arrival date & time: 12/29/21  1216     History  Chief Complaint  Patient presents with   Near Syncope    Lee Mitchell is a 71 y.o. male.  HPI 70 year old male with a history of thyroid cancer, chronic abdominal pain, recently positive colorectal cancer screening, history of syncopal episodes with negative work-up, cannabis use, DM type II, hyperlipidemia, COPD, prior DVT, cluster B personality disorder who presents to the ER with concerns for near syncopal episode.  The patient stated that he got up this morning and felt unwell, but decided to go to the store.  When he got to the store he got very lightheaded, short of breath, diaphoretic, states that he decided to sit down but continued to feel worse despite drinking water.  He then proceeded to lay on the ground.  He denies any loss of consciousness.  He has no seizure-like activity and states that no one in the grocery store stated that he had a seizure.  He continued to feel very weak upon arrival to the ER.  On my exam, the patient states he feels much better, well enough to go home.  He states that he has had episodes like this in the past, has had falls at even required knee surgery.  Per chart review, appears presented for a fall in December 2022 in March of this year.  Patient states that he has had an MRI of the brain, has also followed up with neurology and cardiology, his episodes seem to be due to vasovagal syncope.  He does state that this normally happens when he stands up too fast.  He denies any chest pain, facial droop, fevers, chills    Home Medications Prior to Admission medications   Medication Sig Start Date End Date Taking? Authorizing Provider  albuterol (VENTOLIN HFA) 108 (90 Base) MCG/ACT inhaler INHALE 2 PUFFS INTO THE LUNGS EVERY 6 HOURS AS NEEDED FOR WHEEZE 12/07/21   [name], MD  Dapagliflozin-metFORMIN HCl ER  (XIGDUO XR) 10-500 MG TB24 Take 1 tablet by mouth daily. 11/24/21   [name], MD  diclofenac Sodium (VOLTAREN) 1 % GEL Apply 2 g topically 4 (four) times daily. Rub into affected area of foot 2 to 4 times daily 02/16/21   Trula Slade, DPM  ezetimibe (ZETIA) 10 MG tablet TAKE 1 TABLET BY MOUTH EVERY DAY 12/07/21   [name], MD  Fluticasone-Umeclidin-Vilant (TRELEGY ELLIPTA) 100-62.5-25 MCG/ACT AEPB Inhale 1 puff into the lungs daily. 11/23/21   [name], MD  levothyroxine (SYNTHROID) 150 MCG tablet Take 1 tablet (150 mcg total) by mouth daily before breakfast. 12/01/21   [name], MD  levothyroxine (SYNTHROID) 175 MCG tablet TAKE 1 TABLET (175 MCG TOTAL) BY MOUTH DAILY BEFORE BREAKFAST. 04/07/19   [name], MD  mirtazapine (REMERON SOL-TAB) 30 MG disintegrating tablet DISSOLVE 1 TABLET UNDER TONGUE AT BEDTIME Patient taking differently: Take 30 mg by mouth at bedtime. Dissolve 1 tablet ('30mg'$ ) under tongue at bedtime 04/22/19   [name], MD  mirtazapine (REMERON) 30 MG tablet TAKE 1 TABLET BY MOUTH EVERY DAY IN THE EVENING 12/03/21   [name], MD  omeprazole (PRILOSEC) 40 MG capsule TAKE 1 CAPSULE (40 MG TOTAL) BY MOUTH DAILY. NEEDS APPT FOR ADDITIONAL REFILLS 12/01/21   [name], MD  pregabalin (LYRICA) 75 MG capsule Take 1 capsule (75 mg total) by mouth 2 (  two) times daily. 10/17/21   Trula Slade, DPM  rosuvastatin (CRESTOR) 40 MG tablet Take 40 mg by mouth at bedtime.  02/22/19   [provider]      Allergies    Patient has no known allergies.    Review of Systems   Review of Systems Ten systems reviewed and are negative for acute change, except as noted in the HPI. ' Physical Exam Updated Vital Signs BP 100/68   Pulse 78   Temp (!) 97.5 F (36.4 C) (Oral)   Resp 18   Ht '6\' 1"'$  (1.854 m)   Wt 72.6 kg   SpO2 95%   BMI 21.11 kg/m  Physical Exam Vitals and nursing note reviewed.  Constitutional:      General: He is not  in acute distress.    Appearance: He is well-developed.  HENT:     Head: Normocephalic and atraumatic.  Eyes:     Conjunctiva/sclera: Conjunctivae normal.  Cardiovascular:     Rate and Rhythm: Normal rate and regular rhythm.     Heart sounds: No murmur heard. Pulmonary:     Effort: Pulmonary effort is normal. No respiratory distress.     Breath sounds: Normal breath sounds.  Abdominal:     Palpations: Abdomen is soft.     Tenderness: There is no abdominal tenderness.  Musculoskeletal:        General: No swelling.     Cervical back: Neck supple.  Skin:    General: Skin is warm and dry.     Capillary Refill: Capillary refill takes less than 2 seconds.  Neurological:     General: No focal deficit present.     Mental Status: He is alert and oriented to person, place, and time.     Motor: No weakness.  Psychiatric:        Mood and Affect: Mood normal.     ED Results / Procedures / Treatments   Labs (all labs ordered are listed, but only abnormal results are displayed) Labs Reviewed  CBC WITH DIFFERENTIAL/PLATELET - Abnormal; Notable for the following components:      Result Value   WBC 18.4 (*)    Neutro Abs 15.2 (*)    Abs Immature Granulocytes 0.09 (*)    All other components within normal limits  RESP PANEL BY RT-PCR (FLU A&B, COVID) ARPGX2  COMPREHENSIVE METABOLIC PANEL  LACTIC ACID, PLASMA  LACTIC ACID, PLASMA  RAPID URINE DRUG SCREEN, HOSP PERFORMED  URINALYSIS, ROUTINE W REFLEX MICROSCOPIC  D-DIMER, QUANTITATIVE  TROPONIN I (HIGH SENSITIVITY)    EKG EKG Interpretation  Date/Time:  Tuesday December 29 2021 12:50:15 EDT Ventricular Rate:  76 PR Interval:  202 QRS Duration: 108 QT Interval:  376 QTC Calculation: 423 R Axis:   45 Text Interpretation: Normal sinus rhythm Incomplete right bundle branch block Borderline ECG Confirmed by Carmin Muskrat [phone]) on 12/29/2021 2:37:07 PM  Radiology DG Chest Portable 1 View  Result Date: 12/29/2021 CLINICAL  DATA:  Difficulty breathing. EXAM: PORTABLE CHEST 1 VIEW COMPARISON:  11/23/2021 FINDINGS: The cardiac silhouette, mediastinal and hilar contours within normal limits. Mild hyperinflation and underlying emphysematous changes but no acute overlying pulmonary process. No pleural effusions or pulmonary lesions. No pneumothorax. The bony thorax is intact. Thyroid surgical changes are noted. IMPRESSION: Mild emphysematous changes but no acute pulmonary findings. Electronically Signed   By: Marijo Sanes M.D.   On: 12/29/2021 13:47    Procedures Procedures    Medications Ordered in ED Medications - No  data to display  ED Course/ Medical Decision Making/ A&P                           Medical Decision Making Amount and/or Complexity of Data Reviewed Labs: ordered. Radiology: ordered.   71 year old male presenting with concerns for near syncopal episode.  On arrival, his blood pressure was 88/60, not tachycardic, tachypneic or hypoxic.  BP did improve.  Physical exam was unremarkable, moving all 4 extremities, no focal neurodeficits.  DDx includes vasovagal syncope, hypovolemia, ACS, PE, dissection, electrode abnormality, arrhythmia, seizure  CBC w/ a leukocytosis of 18.4, suspect reactive in the setting of no other infectious symptoms.  The rest of his lab, CMP, troponin, lactic acid, UDS and COVID are pending.  D-dimer ordered to rule out PE.  Chest x-ray ordered, reviewed, mild emphysematous changes but no acute changes.   Care of the patient signed out to [place] PA-C.  She will oversee the rest of his work-up.  Suspect if his work-up is negative, he should follow-up with cardiology for continuous cardiac monitoring. Final Clinical Impression(s) / ED Diagnoses Final diagnoses:  None    Rx / DC Orders ED Discharge Orders     None         Lyndel Safe 12/29/21 1450    Carmin Muskrat, MD 12/30/21 [phone]

## 2021-12-29 NOTE — ED Provider Notes (Signed)
Care assumed from previous provider PA [place]. Please see note for further details.  In short, patient is a 71 year old male who presented for presyncope.  Reports this is happened intermittently for many years.  He did note that he became somewhat short of breath during the episode.  Majority of lab work pending at shift change.  D-dimer was ordered by previous provider.  Plan is to follow-up on lab work and [place] accordingly.  Physical Exam  BP 99/66   Pulse 83   Temp (!) 97.5 F (36.4 C) (Oral)   Resp 16   Ht '6\' 1"'$  (1.854 m)   Wt 72.6 kg   SpO2 96%   BMI 21.11 kg/m   Physical Exam Vitals and nursing note reviewed.  Constitutional:      Appearance: Normal appearance.  HENT:     Head: Normocephalic and atraumatic.  Eyes:     General: No scleral icterus.    Conjunctiva/sclera: Conjunctivae normal.  Pulmonary:     Effort: Pulmonary effort is normal. No respiratory distress.  Skin:    Findings: No rash.  Neurological:     Mental Status: He is alert.  Psychiatric:        Mood and Affect: Mood normal.     Procedures  Procedures  ED Course / MDM    Medical Decision Making Amount and/or Complexity of Data Reviewed Labs: ordered. Radiology: ordered.  Risk Prescription drug management.     D-dimer was elevated so CT PE scan was ordered.  This showed no sign of pulmonary embolus.  He has large amounts of glucose in his urine, suspect poorly controlled diabetes.   Creatinine was 1.43 and he had some ketones in his urine as well.  Suspect dehydration.  Additionally, patient was mildly hypotensive upon arrival.  He had multiple other hypotensive blood pressure readings at bedside.  I suspect some of his near syncope to be secondary to dehydration/hypovolemia.  He has had a work-up without emergent findings today.  I spoke to him at bedside about the glucose in his urine and signs of dehydration.  He says he does not check his blood sugar at home because he was not aware that  he needed to.  Additionally, he says that he does not drink any water but drinks a lot of sweet tea.  This is likely leading to his dehydration and hyperglycemia.  I believe he needs to follow-up with his PCP outpatient.  They can refer him to cardiology if they believe this is appropriate but he has had negative work-up with cardiology for his near syncope in the past.  He is agreeable to this plan and will be discharged.   Darliss Ridgel 12/29/21 1910    Davonna Belling, MD 12/29/21 2355

## 2021-12-29 NOTE — Discharge Instructions (Addendum)
Your CT scan did not show any evidence of blood clot.  You do not have COVID or the flu.  At this time your work-up is revealing of dehydration and poorly controlled diabetes.  Do your best to increase your water intake and try to stop drinking iced tea.  Return with any worsening symptoms however it is important for you to follow-up with your primary care and start taking your blood sugar at home so that you may figure out if your medications are working.  If they see fit, they can refer you to cardiology as well.  It was a pleasure to meet you and we hope you feel better.  Do not hesitate to return to the emergency department with any worsening symptoms.

## 2021-12-31 ENCOUNTER — Encounter: Payer: Self-pay | Admitting: Podiatry

## 2022-01-03 NOTE — Telephone Encounter (Signed)
Can you please schedule him? Thank you!

## 2022-01-05 ENCOUNTER — Encounter: Payer: Self-pay | Admitting: Internal Medicine

## 2022-01-05 ENCOUNTER — Ambulatory Visit (INDEPENDENT_AMBULATORY_CARE_PROVIDER_SITE_OTHER): Payer: Medicare Other | Admitting: Internal Medicine

## 2022-01-05 VITALS — BP 132/78 | HR 85 | Temp 98.0°F | Resp 16 | Ht 73.0 in | Wt 160.0 lb

## 2022-01-05 DIAGNOSIS — E1140 Type 2 diabetes mellitus with diabetic neuropathy, unspecified: Secondary | ICD-10-CM | POA: Diagnosis not present

## 2022-01-05 DIAGNOSIS — Z23 Encounter for immunization: Secondary | ICD-10-CM

## 2022-01-05 DIAGNOSIS — M159 Polyosteoarthritis, unspecified: Secondary | ICD-10-CM | POA: Diagnosis not present

## 2022-01-05 DIAGNOSIS — F332 Major depressive disorder, recurrent severe without psychotic features: Secondary | ICD-10-CM

## 2022-01-05 DIAGNOSIS — Z79891 Long term (current) use of opiate analgesic: Secondary | ICD-10-CM

## 2022-01-05 MED ORDER — HYDROCODONE-ACETAMINOPHEN 5-325 MG PO TABS: 1.0000 | ORAL_TABLET | Freq: Three times a day (TID) | ORAL | 0 refills | Status: DC | PRN

## 2022-01-05 MED ORDER — SHINGRIX 50 MCG/0.5ML IM SUSR: 0.5000 mL | Freq: Once | INTRAMUSCULAR | 1 refills | Status: AC

## 2022-01-05 MED ORDER — NALOXONE HCL 4 MG/0.1ML NA LIQD: 1.0000 | Freq: Once | NASAL | 2 refills | Status: AC

## 2022-01-05 NOTE — Patient Instructions (Signed)
www.diabetes.org [email or web address] [email or web address]://[email or web address].[email or web address]    Diabetic Neuropathy Diabetic neuropathy refers to nerve damage that is caused by diabetes. Over time, people with diabetes can develop nerve damage throughout the body. There are several types of diabetic neuropathy: Peripheral neuropathy. This is the most common type of diabetic neuropathy. It damages the nerves that carry signals between the spinal cord and other parts of the body (peripheral nerves). This usually affects nerves in the feet, legs, hands, and arms. Autonomic neuropathy. This type causes damage to nerves that control involuntary functions (autonomic nerves). Involuntary functions are functions of the body that you do not control. They include heartbeat, body temperature, blood pressure, urination, digestion, sweating, sexual function, or response to changes in blood glucose. Focal neuropathy. This type of nerve damage affects one area of the body, such as an arm, a leg, or the face. The injury may involve one nerve or a small group of nerves. Focal neuropathy can be painful and unpredictable. It occurs most often in older adults with diabetes. This often develops suddenly, but usually improves over time and does not cause long-term problems. Proximal neuropathy. This type of nerve damage affects the nerves of the thighs, hips, buttocks, or legs. It causes severe pain, weakness, and muscle death (atrophy), usually in the thigh muscles. It is more common among older men and people who have type 2 diabetes. The length of recovery time may vary. What are the causes? Peripheral, autonomic, and focal neuropathies are caused by diabetes that is not well controlled with treatment. The cause of proximal neuropathy is not known, but it may be caused by inflammation related to uncontrolled blood glucose  levels. What are the signs or symptoms? Peripheral neuropathy Peripheral neuropathy develops slowly over time. When the nerves of the feet and legs no longer work, you may experience: Burning, stabbing, or aching pain in the legs or feet. Pain or cramping in the legs or feet. Loss of feeling (numbness) and inability to feel pressure or pain in the feet. This can lead to: Thick calluses or sores on areas of constant pressure. Ulcers. Reduced ability to feel temperature changes. Foot deformities. Muscle weakness. Loss of balance or coordination. Autonomic neuropathy The symptoms of autonomic neuropathy vary depending on which nerves are affected. Symptoms may include: Problems with digestion, such as: Nausea or vomiting. Poor appetite. Bloating. Diarrhea or constipation. Trouble swallowing. Losing weight without trying to. Problems with the heart, blood, and lungs, such as: Dizziness, especially when standing up. Fainting. Shortness of breath. Irregular heartbeat. Bladder problems, such as: Trouble starting or stopping urination. Leaking urine. Trouble emptying the bladder. Urinary tract infections (UTIs). Problems with other body functions, such as: Sweat. You may sweat too much or too little. Temperature. You might get hot easily. Or, you might feel cold more than usual. Sexual function. Men may not be able to get or maintain an erection. Women may have vaginal dryness and difficulty with arousal. Focal neuropathy Symptoms affect only one area of the body. Common symptoms include: Numbness. Tingling. Burning pain. Prickling feeling. Very sensitive skin. Weakness. Inability to move (paralysis). Muscle twitching. Muscles getting smaller (wasting).  Poor coordination. Double or blurred vision. Proximal neuropathy Sudden, severe pain in the hip, thigh, or buttocks. Pain may spread from the back into the legs (sciatica). Pain and numbness in the arms and  legs. Tingling. Loss of bladder control or bowel control. Weakness and wasting of thigh muscles. Difficulty getting up from a seated position. Abdominal swelling. Unexplained weight loss. How is this diagnosed? Diagnosis varies depending on the type of neuropathy your health care provider suspects. Peripheral neuropathy Your health care provider will do a neurologic exam. This exam checks your reflexes, how you move, and what you can feel. You may have other tests, such as: Blood tests. Tests of the fluid that surrounds the spinal cord (lumbar puncture). CT scan. MRI. Checking the nerves that control muscles (electromyogram, or EMG). Checking how quickly signals pass through your nerves (nerve conduction study). Checking a small piece of a nerve using a microscope (biopsy). Autonomic neuropathy You may have tests, such as: Tests to measure your blood pressure and heart rate. You may be secured to an exam table that moves you from a lying position to an upright position (table tilt test). Breathing tests to check your lungs. Tests to check how food moves through the digestive system (gastric emptying tests). Blood, sweat, or urine tests. Ultrasound of your bladder. Spinal fluid tests. Focal neuropathy This condition may be diagnosed with: A neurologic exam. CT scan. MRI. EMG. Nerve conduction study. Proximal neuropathy There is no test to diagnose this type of neuropathy. You may have tests to rule out other possible causes of this type of neuropathy. Tests may include: X-rays of your spine and lumbar region. Lumbar puncture. MRI. How is this treated? The goal of treatment is to keep nerve damage from getting worse. Treatment may include: Following your diabetes management plan. This will help keep your blood glucose level and your A1C level within your target range. This is the most important treatment. Using prescription pain medicine. Follow these instructions at  home: Diabetes management Follow your diabetes management plan as told by your health care provider. Check your blood glucose levels. Keep your blood glucose in your target range. Have your A1C level checked at least two times a year, or as often as told. Take over the counter and prescription medicines only as told by your health care provider. This includes insulin and diabetes medicine.  Lifestyle  Do not use any products that contain nicotine or tobacco, such as cigarettes, e-cigarettes, and chewing tobacco. If you need help quitting, ask your health care provider. Be physically active every day. Include strength training and balance exercises. Follow a healthy meal plan. Work with your health care provider to manage your blood pressure. General instructions Ask your health care provider if the medicine prescribed to you requires you to avoid driving or using machinery. Check your skin and feet every day for cuts, bruises, redness, blisters, or sores. Keep all follow-up visits. This is important. Contact a health care provider if: You have burning, stabbing, or aching pain in your legs or feet. You are unable to feel pressure or pain in your feet. You develop problems with digestion, such as: Nausea. Vomiting. Bloating. Constipation. Diarrhea. Abdominal pain. You have difficulty with urination, such as: Inability to control when you urinate (incontinence). Inability to completely empty the bladder (retention). You feel as if your heart is racing (palpitations). You feel dizzy, weak, or faint when you stand up. Get help right away if: You cannot urinate. You have sudden weakness or  loss of coordination. You have trouble speaking. You have pain or pressure in your chest. You have an irregular heartbeat. You have sudden inability to move a part of your body. These symptoms may represent a serious problem that is an emergency. Do not wait to see if the symptoms will go away.  Get medical help right away. Call your local emergency services (911 in the [place] Do not drive yourself to the hospital. Summary Diabetic neuropathy is nerve damage that is caused by diabetes. It can cause numbness and pain in the arms, legs, digestive tract, heart, and other body systems. This condition is treated by keeping your blood glucose level and your A1C level within your target range. This can help prevent neuropathy from getting worse. Check your skin and feet every day for cuts, bruises, redness, blisters, or sores. Do not use any products that contain nicotine or tobacco, such as cigarettes, e-cigarettes, and chewing tobacco. This information is not intended to replace advice given to you by your health care provider. Make sure you discuss any questions you have with your health care provider. Document Revised: 08/16/2019 Document Reviewed: 08/16/2019 Elsevier Patient Education  2023 [employer]                                                                    [email or web address].org [email or web address] [email or web address]://[email or web address]

## 2022-01-05 NOTE — Progress Notes (Unsigned)
Subjective:  Patient ID: Lee Mitchell, male    DOB: November 20, 1950  Age: 71 y.o. MRN: [id number]  CC: Hypertension, Diabetes, and Osteoarthritis   HPI BOSCO PAPARELLA presents for f/up -  He has had no more syncopal episodes.  He denies dizziness, lightheadedness, chest pain, shortness of breath, or edema.  He walks about 3 to 4 miles several times a week.  He complains of pain in his large joints in his feet that interfere with his sleep and his daily activity.  He describes the pain in his feet as a sharp, stabbing sensation.  He wants to see a psychologist.  Outpatient Medications Prior to Visit  Medication Sig Dispense Refill   albuterol (VENTOLIN HFA) 108 (90 Base) MCG/ACT inhaler INHALE 2 PUFFS INTO THE LUNGS EVERY 6 HOURS AS NEEDED FOR WHEEZE 6.7 each 5   Dapagliflozin-metFORMIN HCl ER (XIGDUO XR) 10-500 MG TB24 Take 1 tablet by mouth daily. 90 tablet 1   diclofenac Sodium (VOLTAREN) 1 % GEL Apply 2 g topically 4 (four) times daily. Rub into affected area of foot 2 to 4 times daily 100 g 2   ezetimibe (ZETIA) 10 MG tablet TAKE 1 TABLET BY MOUTH EVERY DAY 90 tablet 1   Fluticasone-Umeclidin-Vilant (TRELEGY ELLIPTA) 100-62.5-25 MCG/ACT AEPB Inhale 1 puff into the lungs daily. 120 each 1   levothyroxine (SYNTHROID) 150 MCG tablet Take 1 tablet (150 mcg total) by mouth daily before breakfast. 90 tablet 0   mirtazapine (REMERON SOL-TAB) 30 MG disintegrating tablet DISSOLVE 1 TABLET UNDER TONGUE AT BEDTIME (Patient taking differently: Take 30 mg by mouth at bedtime. Dissolve 1 tablet ('30mg'$ ) under tongue at bedtime) 90 tablet 0   mirtazapine (REMERON) 30 MG tablet TAKE 1 TABLET BY MOUTH EVERY DAY IN THE EVENING 90 tablet 1   omeprazole (PRILOSEC) 40 MG capsule TAKE 1 CAPSULE (40 MG TOTAL) BY MOUTH DAILY. NEEDS APPT FOR ADDITIONAL REFILLS 90 capsule 0   pregabalin (LYRICA) 75 MG capsule Take 1 capsule (75 mg total) by mouth 2 (two) times daily. 60 capsule 1   rosuvastatin (CRESTOR) 40 MG  tablet Take 40 mg by mouth at bedtime.      levothyroxine (SYNTHROID) 175 MCG tablet TAKE 1 TABLET (175 MCG TOTAL) BY MOUTH DAILY BEFORE BREAKFAST. 90 tablet 1   No facility-administered medications prior to visit.    ROS Review of Systems  Constitutional: Negative.  Negative for chills, diaphoresis, fatigue and fever.  HENT: Negative.    Eyes: Negative.   Respiratory:  Negative for cough, chest tightness, shortness of breath and wheezing.   Cardiovascular:  Negative for chest pain, palpitations and leg swelling.  Gastrointestinal:  Negative for abdominal pain, constipation, diarrhea and nausea.  Endocrine: Negative.   Genitourinary: Negative.  Negative for difficulty urinating and dysuria.  Musculoskeletal:  Positive for arthralgias. Negative for joint swelling and myalgias.  Skin: Negative.   Neurological: Negative.  Negative for dizziness, syncope, weakness and light-headedness.  Hematological: Negative.   Psychiatric/Behavioral: Negative.      Objective:  BP 132/78 (BP Location: Right Arm, Patient Position: Sitting, Cuff Size: Large)   Pulse 85   Temp 98 F (36.7 C) (Oral)   Resp 16   Ht '6\' 1"'$  (1.854 m)   Wt 160 lb (72.6 kg)   SpO2 94%   BMI 21.11 kg/m   BP Readings from Last 3 Encounters:  01/05/22 132/78  12/29/21 102/69  11/23/21 120/68    Wt Readings from Last 3 Encounters:  01/05/22 160 lb (  72.6 kg)  12/29/21 160 lb (72.6 kg)  11/23/21 170 lb (77.1 kg)    Physical Exam Vitals reviewed.  HENT:     Nose: Nose normal.     Mouth/Throat:     Mouth: Mucous membranes are moist.  Eyes:     General: No scleral icterus.    Conjunctiva/sclera: Conjunctivae normal.  Cardiovascular:     Rate and Rhythm: Normal rate and regular rhythm.     Heart sounds: No murmur heard. Pulmonary:     Effort: Pulmonary effort is normal.     Breath sounds: No stridor. No wheezing, rhonchi or rales.  Abdominal:     General: Abdomen is flat.     Palpations: There is no mass.      Tenderness: There is no abdominal tenderness. There is no guarding.     Hernia: No hernia is present.  Musculoskeletal:        General: Deformity (djd) present. No swelling, tenderness or signs of injury.     Cervical back: Neck supple.     Right lower leg: No edema.     Left lower leg: No edema.  Lymphadenopathy:     Cervical: No cervical adenopathy.  Skin:    General: Skin is warm and dry.  Neurological:     General: No focal deficit present.     Mental Status: He is alert. Mental status is at baseline.  Psychiatric:        Mood and Affect: Mood normal.        Behavior: Behavior normal.     Lab Results  Component Value Date   WBC 18.4 (H) 12/29/2021   HGB 15.5 12/29/2021   HCT 46.0 12/29/2021   PLT 260 12/29/2021   GLUCOSE 105 (H) 12/29/2021   CHOL 107 11/23/2021   TRIG 76.0 11/23/2021   HDL 40.60 11/23/2021   LDLCALC 52 11/23/2021   ALT 36 12/29/2021   AST 32 12/29/2021   NA 140 12/29/2021   K 4.4 12/29/2021   CL 109 12/29/2021   CREATININE 1.43 (H) 12/29/2021   BUN 16 12/29/2021   CO2 20 (L) 12/29/2021   TSH 5.61 (H) 11/23/2021   PSA 6.20 (H) 11/23/2021   INR 0.9 06/24/2019   HGBA1C 7.3 (H) 11/23/2021   MICROALBUR 2.4 (H) 11/23/2021    CT Angio Chest PE W and/or Wo Contrast  Result Date: 12/29/2021 CLINICAL DATA:  Positive D-dimer.  Difficulty breathing. EXAM: CT ANGIOGRAPHY CHEST WITH CONTRAST TECHNIQUE: Multidetector CT imaging of the chest was performed using the standard protocol during bolus administration of intravenous contrast. Multiplanar CT image reconstructions and MIPs were obtained to evaluate the vascular anatomy. RADIATION DOSE REDUCTION: This exam was performed according to the departmental dose-optimization program which includes automated exposure control, adjustment of the mA and/or kV according to patient size and/or use of iterative reconstruction technique. CONTRAST:  26m OMNIPAQUE IOHEXOL 350 MG/ML SOLN COMPARISON:  Chest x-ray same day.  CT angiogram chest 02/21/2018. CT abdomen and pelvis 12/19/2020. FINDINGS: Cardiovascular: Satisfactory opacification of the pulmonary arteries to the segmental level. No evidence of pulmonary embolism. Normal heart size. No pericardial effusion. Mediastinum/Nodes: Thyroid gland is surgically absent. There are no enlarged mediastinal or hilar lymph nodes. Esophagus is within normal limits. Lungs/Pleura: Mild emphysematous changes are present. There is minimal linear scarring or atelectasis in the lung bases. The lungs are otherwise clear. No pleural effusion or pneumothorax. Upper Abdomen: Hypodense liver lesions measuring up to 3.5 cm are unchanged from prior and were previously characterized  as hemangiomas. Musculoskeletal: No chest wall abnormality. No acute or significant osseous findings. Review of the MIP images confirms the above findings. IMPRESSION: 1. No evidence for pulmonary embolism. 2. No acute cardiopulmonary process. 3.  Emphysema (ICD10-J43.9). Electronically Signed   By: Ronney Asters M.D.   On: 12/29/2021 18:28   DG Chest Portable 1 View  Result Date: 12/29/2021 CLINICAL DATA:  Difficulty breathing. EXAM: PORTABLE CHEST 1 VIEW COMPARISON:  11/23/2021 FINDINGS: The cardiac silhouette, mediastinal and hilar contours within normal limits. Mild hyperinflation and underlying emphysematous changes but no acute overlying pulmonary process. No pleural effusions or pulmonary lesions. No pneumothorax. The bony thorax is intact. Thyroid surgical changes are noted. IMPRESSION: Mild emphysematous changes but no acute pulmonary findings. Electronically Signed   By: Marijo Sanes M.D.   On: 12/29/2021 13:47    Assessment & Plan:   Satchel was seen today for hypertension, diabetes and osteoarthritis.  Diagnoses and all orders for this visit:  Flu vaccine need -     Flu Vaccine QUAD High Dose(Fluad)  Diabetic neuropathy, painful ([name]) -     HYDROcodone-acetaminophen (NORCO/VICODIN) 5-325 MG tablet;  Take 1 tablet by mouth every 8 (eight) hours as needed for moderate pain.  Primary osteoarthritis involving multiple joints -     HYDROcodone-acetaminophen (NORCO/VICODIN) 5-325 MG tablet; Take 1 tablet by mouth every 8 (eight) hours as needed for moderate pain.  Long-term current use of opiate analgesic -     naloxone (NARCAN) nasal spray 4 mg/0.1 mL; Place 1 spray into the nose once for 1 dose.  Need for pneumococcal vaccination -     Pneumococcal polysaccharide vaccine 23-valent greater than or equal to 2yo subcutaneous/IM  Severe episode of recurrent major depressive disorder, without psychotic features ([place]) -     Ambulatory referral to Psychology  Other orders -     Zoster Vaccine Adjuvanted [facility]) injection; Inject 0.5 mLs into the muscle once for 1 dose.   I am having Honor Loh. Rosenkranz start on HYDROcodone-acetaminophen, naloxone, and Shingrix. I am also having him maintain his mirtazapine, rosuvastatin, diclofenac Sodium, pregabalin, Trelegy Ellipta, Xigduo XR, levothyroxine, omeprazole, mirtazapine, ezetimibe, and albuterol.  Meds ordered this encounter  Medications   HYDROcodone-acetaminophen (NORCO/VICODIN) 5-325 MG tablet    Sig: Take 1 tablet by mouth every 8 (eight) hours as needed for moderate pain.    Dispense:  65 tablet    Refill:  0   naloxone (NARCAN) nasal spray 4 mg/0.1 mL    Sig: Place 1 spray into the nose once for 1 dose.    Dispense:  2 each    Refill:  2   Zoster Vaccine Adjuvanted [facility]) injection    Sig: Inject 0.5 mLs into the muscle once for 1 dose.    Dispense:  0.5 mL    Refill:  1     Follow-up: Return in about 3 months (around 04/06/2022).  Scarlette Calico, MD

## 2022-01-07 ENCOUNTER — Encounter: Payer: Self-pay | Admitting: Internal Medicine

## 2022-01-08 ENCOUNTER — Ambulatory Visit (INDEPENDENT_AMBULATORY_CARE_PROVIDER_SITE_OTHER): Payer: Medicare Other

## 2022-01-08 VITALS — Ht 74.0 in | Wt 160.0 lb

## 2022-01-08 DIAGNOSIS — Z0000 Encounter for general adult medical examination without abnormal findings: Secondary | ICD-10-CM | POA: Diagnosis not present

## 2022-01-08 NOTE — Progress Notes (Signed)
Subjective:   Lee Mitchell is a 70 y.o. male who presents for Medicare Annual/Subsequent preventive examination.   Virtual Visit via Telephone Note  I connected with  Lee Mitchell on 01/08/22 at 11:15 AM EDT by telephone and verified that I am speaking with the correct person using two identifiers.  Location: Patient: home  Provider: Paulla Fore  Persons participating in the virtual visit: patient/Nurse Health Advisor   I discussed the limitations, risks, security and privacy concerns of performing an evaluation and management service by telephone and the availability of in person appointments. The patient expressed understanding and agreed to proceed.  Interactive audio and video telecommunications were attempted between this nurse and patient, however failed, due to patient having technical difficulties OR patient did not have access to video capability.  We continued and completed visit with audio only.  Some vital signs may be absent or patient reported.   Daphane Shepherd, LPN  Review of Systems     Cardiac Risk Factors include: advanced age (>2mn, >>67women);male gender;hypertension     Objective:    Today's Vitals   01/08/22 1051  Weight: 160 lb (72.6 kg)  Height: '6\' 2"'$  (1.88 m)   Body mass index is 20.54 kg/m.     01/08/2022   11:00 AM 04/15/2021   12:32 PM 12/19/2020   10:08 AM 09/14/2019    6:27 AM 06/24/2019    3:23 PM 06/16/2019    6:12 PM 02/20/2018   10:06 PM  Advanced Directives  Does Patient Have a Medical Advance Directive? No No No No No No No  Would patient like information on creating a medical advance directive? No - Patient declined   Yes (ED - Information included in AVS) No - Patient declined  Yes (ED - Information included in AVS)    Current Medications (verified) Outpatient Encounter Medications as of 01/08/2022  Medication Sig   albuterol (VENTOLIN HFA) 108 (90 Base) MCG/ACT inhaler INHALE 2 PUFFS INTO THE LUNGS EVERY 6 HOURS AS  NEEDED FOR WHEEZE   Dapagliflozin-metFORMIN HCl ER (XIGDUO XR) 10-500 MG TB24 Take 1 tablet by mouth daily.   ezetimibe (ZETIA) 10 MG tablet TAKE 1 TABLET BY MOUTH EVERY DAY   Fluticasone-Umeclidin-Vilant (TRELEGY ELLIPTA) 100-62.5-25 MCG/ACT AEPB Inhale 1 puff into the lungs daily.   HYDROcodone-acetaminophen (NORCO/VICODIN) 5-325 MG tablet Take 1 tablet by mouth every 8 (eight) hours as needed for moderate pain.   levothyroxine (SYNTHROID) 150 MCG tablet Take 1 tablet (150 mcg total) by mouth daily before breakfast.   mirtazapine (REMERON SOL-TAB) 30 MG disintegrating tablet DISSOLVE 1 TABLET UNDER TONGUE AT BEDTIME (Patient taking differently: Take 30 mg by mouth at bedtime. Dissolve 1 tablet ('30mg'$ ) under tongue at bedtime)   mirtazapine (REMERON) 30 MG tablet TAKE 1 TABLET BY MOUTH EVERY DAY IN THE EVENING   omeprazole (PRILOSEC) 40 MG capsule TAKE 1 CAPSULE (40 MG TOTAL) BY MOUTH DAILY. NEEDS APPT FOR ADDITIONAL REFILLS   rosuvastatin (CRESTOR) 40 MG tablet Take 40 mg by mouth at bedtime.    diclofenac Sodium (VOLTAREN) 1 % GEL Apply 2 g topically 4 (four) times daily. Rub into affected area of foot 2 to 4 times daily (Patient not taking: Reported on 01/08/2022)   pregabalin (LYRICA) 75 MG capsule Take 1 capsule (75 mg total) by mouth 2 (two) times daily. (Patient not taking: Reported on 01/08/2022)   No facility-administered encounter medications on file as of 01/08/2022.    Allergies (verified) Patient has no known allergies.  History: Past Medical History:  Diagnosis Date   Chronic abdominal pain    Colon polyps    PNA (pneumonia)    Thyroid cancer ([place])    Past Surgical History:  Procedure Laterality Date   ANKLE ARTHROSCOPY WITH RECONSTRUCTION     BACK SURGERY     CHOLECYSTECTOMY     COLONOSCOPY     ELBOW ARTHROSCOPY WITH TENDON RECONSTRUCTION     SHOULDER ARTHROSCOPY WITH LABRAL REPAIR     THYROIDECTOMY     2004/2005   ULNAR NERVE TRANSPOSITION     Family History   Problem Relation Age of Onset   Hypertension Mother    Hyperthyroidism Mother    Cancer Mother 73       thyroid cancer   Kidney cancer Father    Hypertension Father    Thyroid cancer Maternal Uncle    Neuropathy Neg Hx    Social History   Socioeconomic History   Marital status: Single    Spouse name: Not on file   Number of children: 2   Years of education: Not on file   Highest education level: Not on file  Occupational History   Occupation: event planner   Tobacco Use   Smoking status: Every Day    Packs/day: 0.25    Years: 35.00    Total pack years: 8.75    Types: Cigarettes    Passive exposure: Current   Smokeless tobacco: Never  Vaping Use   Vaping Use: Never used  Substance and Sexual Activity   Alcohol use: Not Currently    Comment: occ   Drug use: Yes    Types: Marijuana   Sexual activity: Not Currently    Partners: Female  Other Topics Concern   Not on file  Social History Narrative   Not on file   Social Determinants of Health   Financial Resource Strain: Low Risk  (01/08/2022)   Overall Financial Resource Strain (CARDIA)    Difficulty of Paying Living Expenses: Not hard at all  Food Insecurity: No Food Insecurity (01/08/2022)   Hunger Vital Sign    Worried About Running Out of Food in the Last Year: Never true    Ran Out of Food in the Last Year: Never true  Transportation Needs: No Transportation Needs (01/08/2022)   PRAPARE - Hydrologist (Medical): No    Lack of Transportation (Non-Medical): No  Physical Activity: Inactive (01/08/2022)   Exercise Vital Sign    Days of Exercise per Week: 0 days    Minutes of Exercise per Session: 0 min  Stress: No Stress Concern Present (01/08/2022)   [place]    Feeling of Stress : Not at all  Social Connections: Socially Isolated (01/08/2022)   Social Connection and Isolation Panel [NHANES]    Frequency of  Communication with Friends and Family: More than three times a week    Frequency of Social Gatherings with Friends and Family: More than three times a week    Attends Religious Services: Never    Marine scientist or Organizations: No    Attends Music therapist: Never    Marital Status: Divorced    Tobacco Counseling Ready to quit: Not Answered Counseling given: Not Answered   Clinical Intake:                 Diabetic?yes Nutrition Risk Assessment:  Has the patient had any N/V/D within the last 2  months?  No  Does the patient have any non-healing wounds?  No  Has the patient had any unintentional weight loss or weight gain?  No   Diabetes:  Is the patient diabetic?  Yes  If diabetic, was a CBG obtained today?  No  Did the patient bring in their glucometer from home?  No  How often do you monitor your CBG's? Never .   Financial Strains and Diabetes Management:  Are you having any financial strains with the device, your supplies or your medication? No .  Does the patient want to be seen by Chronic Care Management for management of their diabetes?  No  Would the patient like to be referred to a Nutritionist or for Diabetic Management?  No   Diabetic Exams:  Diabetic Eye Exam: Completed 01/2021 Diabetic Foot Exam: Overdue, Pt has been advised about the importance in completing this exam. Pt is scheduled for diabetic foot exam on next office visit .          Activities of Daily Living    01/08/2022   11:01 AM  In your present state of health, do you have any difficulty performing the following activities:  Hearing? 0  Vision? 0  Difficulty concentrating or making decisions? 0  Walking or climbing stairs? 0  Dressing or bathing? 0  Doing errands, shopping? 0  Preparing Food and eating ? N  Using the Toilet? N  In the past six months, have you accidently leaked urine? N  Do you have problems with loss of bowel control? N  Managing your  Medications? N  Managing your Finances? N  Housekeeping or managing your Housekeeping? N    Patient Care Team: [name], MD as PCP - General (Internal Medicine)  Indicate any recent Medical Services you may have received from other than [facility] providers in the past year (date may be approximate).     Assessment:   This is a routine wellness examination for [facility].  Hearing/Vision screen Vision Screening - Comments:: Annual eye exams wear glasses/contacts   Dietary issues and exercise activities discussed: Current Exercise Habits: The patient does not participate in regular exercise at present, Exercise limited by: None identified   Goals Addressed             This Visit's Progress    Exercise 3x per week (30 min per time)         Depression Screen    01/08/2022   10:57 AM 11/23/2021   10:08 AM 12/14/2018    9:58 AM 04/26/2018    2:19 PM  PHQ 2/9 Scores  PHQ - 2 Score 0 '5 3 3  '$ PHQ- 9 Score 0 '12 9 11    '$ Fall Risk    01/08/2022   10:53 AM 11/23/2021   10:09 AM 12/14/2018    9:58 AM 04/29/2018   10:22 AM 04/26/2018    1:21 PM  [place] in the past year? 0 1 0 0 0  Number falls in past yr: 0 0 0  0  Injury with Fall? 0 1 0  0  Risk for fall due to : No Fall Risks Other (Comment)     Follow up Falls prevention discussed Falls evaluation completed Falls evaluation completed  Falls evaluation completed    [place]:  Any stairs in or around the home? No  If so, are there any without handrails? No  Home free of loose throw rugs in  walkways, pet beds, electrical cords, etc? Yes  Adequate lighting in your home to reduce risk of falls? Yes   ASSISTIVE DEVICES UTILIZED TO PREVENT FALLS:  Life alert? No  Use of a cane, walker or w/c? No  Grab bars in the bathroom? No  Shower chair or bench in shower? No  Elevated toilet seat or a handicapped toilet? Yes        01/08/2022   11:02 AM  6CIT Screen  What Year? 0 points   What month? 0 points  What time? 0 points  Count back from 20 0 points  Months in reverse 0 points  Repeat phrase 0 points  Total Score 0 points    Immunizations Immunization History  Administered Date(s) Administered   Fluad Quad(high Dose 65+) 12/14/2018, 01/05/2022   Influenza, High Dose Seasonal PF 04/26/2018, 11/18/2018, 12/14/2018, 02/18/2020   Influenza,inj,Quad PF,6+ Mos 02/16/2017   Influenza-Unspecified 12/18/2016, 02/16/2017   Moderna SARS-COV2 Booster Vaccination 01/30/2021   Moderna Sars-Covid-2 Vaccination 06/21/2019, 07/19/2019, 02/18/2020   Pneumococcal Conjugate-13 01/19/2018   Pneumococcal Polysaccharide-23 02/26/2019, 01/05/2022   Tdap 04/19/2014, 07/06/2015    TDAP status: Up to date  Flu Vaccine status: Due, Education has been provided regarding the importance of this vaccine. Advised may receive this vaccine at local pharmacy or Health Dept. Aware to provide a copy of the vaccination record if obtained from local pharmacy or Health Dept. Verbalized acceptance and understanding.  Pneumococcal vaccine status: Up to date  Covid-19 vaccine status: Completed vaccines  Qualifies for Shingles Vaccine? Yes   Zostavax completed No   Shingrix Completed?: Yes  Screening Tests Health Maintenance  Topic Date Due   OPHTHALMOLOGY EXAM  Never done   Zoster Vaccines- Shingrix (1 of 2) Never done   HEMOGLOBIN A1C  05/26/2022   Diabetic kidney evaluation - Urine ACR  11/24/2022   FOOT EXAM  11/24/2022   Diabetic kidney evaluation - GFR measurement  12/30/2022   Fecal DNA (Cologuard)  12/08/2024   TETANUS/TDAP  07/05/2025   Pneumonia Vaccine 6+ Years old  Completed   INFLUENZA VACCINE  Completed   Hepatitis C Screening  Completed   HPV VACCINES  Aged Out   COVID-19 Vaccine  Discontinued    Health Maintenance  Health Maintenance Due  Topic Date Due   OPHTHALMOLOGY EXAM  Never done   Zoster Vaccines- Shingrix (1 of 2) Never done    Colorectal cancer  screening: Type of screening: Cologuard. Completed 12/08/2021. Repeat every 3 years  Lung Cancer Screening: (Low Dose CT Chest recommended if Age 68-80 years, 30 pack-year currently smoking OR have quit w/in 15years.) does not qualify.   Lung Cancer Screening Referral: n/a  Additional Screening:  Hepatitis C Screening: does not qualify;   Vision Screening: Recommended annual ophthalmology exams for early detection of glaucoma and other disorders of the eye. Is the patient up to date with their annual eye exam?  Yes  Who is the provider or what is the name of the office in which the patient attends annual eye exams? Dr.Miller  If pt is not established with a provider, would they like to be referred to a provider to establish care? No .   Dental Screening: Recommended annual dental exams for proper oral hygiene  Community Resource Referral / Chronic Care Management: CRR required this visit?  No   CCM required this visit?  No      Plan:     I have personally reviewed and noted the following in the patient's chart:  Medical and social history Use of alcohol, tobacco or illicit drugs  Current medications and supplements including opioid prescriptions. Patient is not currently taking opioid prescriptions. Functional ability and status Nutritional status Physical activity Advanced directives List of other physicians Hospitalizations, surgeries, and ER visits in previous 12 months Vitals Screenings to include cognitive, depression, and falls Referrals and appointments  In addition, I have reviewed and discussed with patient certain preventive protocols, quality metrics, and best practice recommendations. A written personalized care plan for preventive services as well as general preventive health recommendations were provided to patient.     Daphane Shepherd, LPN   [id number]   Nurse Notes: none

## 2022-01-08 NOTE — Patient Instructions (Signed)
Mr. Lee Mitchell , Thank you for taking time to come for your Medicare Wellness Visit. I appreciate your ongoing commitment to your health goals. Please review the following plan we discussed and let me know if I can assist you in the future.   These are the goals we discussed:  Goals      Exercise 3x per week (30 min per time)        This is a list of the screening recommended for you and due dates:  Health Maintenance  Topic Date Due   Eye exam for diabetics  Never done   Zoster (Shingles) Vaccine (1 of 2) Never done   Hemoglobin A1C  05/26/2022   Yearly kidney health urinalysis for diabetes  11/24/2022   Complete foot exam   11/24/2022   Yearly kidney function blood test for diabetes  12/30/2022   Cologuard (Stool DNA test)  12/08/2024   Tetanus Vaccine  07/05/2025   Pneumonia Vaccine  Completed   Flu Shot  Completed   Hepatitis C Screening: USPSTF Recommendation to screen - Ages 18-79 yo.  Completed   HPV Vaccine  Aged Out   COVID-19 Vaccine  Discontinued    Advanced directives: Advance directive discussed with you today. I have provided a copy for you to complete at home and have notarized. Once this is complete please bring a copy in to our office so we can scan it into your chart.   Conditions/risks identified: Aim for 30 minutes of exercise or brisk walking, 6-8 glasses of water, and 5 servings of fruits and vegetables each day.   Next appointment: Follow up in one year for your annual wellness visit.   Preventive Care 69 Years and Older, Male  Preventive care refers to lifestyle choices and visits with your health care provider that can promote health and wellness. What does preventive care include? A yearly physical exam. This is also called an annual well check. Dental exams once or twice a year. Routine eye exams. Ask your health care provider how often you should have your eyes checked. Personal lifestyle choices, including: Daily care of your teeth and  gums. Regular physical activity. Eating a healthy diet. Avoiding tobacco and drug use. Limiting alcohol use. Practicing safe sex. Taking low doses of aspirin every day. Taking vitamin and mineral supplements as recommended by your health care provider. What happens during an annual well check? The services and screenings done by your health care provider during your annual well check will depend on your age, overall health, lifestyle risk factors, and family history of disease. Counseling  Your health care provider may ask you questions about your: Alcohol use. Tobacco use. Drug use. Emotional well-being. Home and relationship well-being. Sexual activity. Eating habits. History of falls. Memory and ability to understand (cognition). Work and work Statistician. Screening  You may have the following tests or measurements: Height, weight, and BMI. Blood pressure. Lipid and cholesterol levels. These may be checked every 5 years, or more frequently if you are over 11 years old. Skin check. Lung cancer screening. You may have this screening every year starting at age 57 if you have a 30-pack-year history of smoking and currently smoke or have quit within the past 15 years. Fecal occult blood test (FOBT) of the stool. You may have this test every year starting at age 48. Flexible sigmoidoscopy or colonoscopy. You may have a sigmoidoscopy every 5 years or a colonoscopy every 10 years starting at age 35. Prostate cancer screening. Recommendations will  vary depending on your family history and other risks. Hepatitis C blood test. Hepatitis B blood test. Sexually transmitted disease (STD) testing. Diabetes screening. This is done by checking your blood sugar (glucose) after you have not eaten for a while (fasting). You may have this done every 1-3 years. Abdominal aortic aneurysm (AAA) screening. You may need this if you are a current or former smoker. Osteoporosis. You may be screened  starting at age 52 if you are at high risk. Talk with your health care provider about your test results, treatment options, and if necessary, the need for more tests. Vaccines  Your health care provider may recommend certain vaccines, such as: Influenza vaccine. This is recommended every year. Tetanus, diphtheria, and acellular pertussis (Tdap, Td) vaccine. You may need a Td booster every 10 years. Zoster vaccine. You may need this after age 27. Pneumococcal 13-valent conjugate (PCV13) vaccine. One dose is recommended after age 49. Pneumococcal polysaccharide (PPSV23) vaccine. One dose is recommended after age 36. Talk to your health care provider about which screenings and vaccines you need and how often you need them. This information is not intended to replace advice given to you by your health care provider. Make sure you discuss any questions you have with your health care provider. Document Released: 05/02/2015 Document Revised: 12/24/2015 Document Reviewed: 02/04/2015 Elsevier Interactive Patient Education  2017 [place] Prevention in the Home Falls can cause injuries. They can happen to people of all ages. There are many things you can do to make your home safe and to help prevent falls. What can I do on the outside of my home? Regularly fix the edges of walkways and driveways and fix any cracks. Remove anything that might make you trip as you walk through a door, such as a raised step or threshold. Trim any bushes or trees on the path to your home. Use bright outdoor lighting. Clear any walking paths of anything that might make someone trip, such as rocks or tools. Regularly check to see if handrails are loose or broken. Make sure that both sides of any steps have handrails. Any raised decks and porches should have guardrails on the edges. Have any leaves, snow, or ice cleared regularly. Use sand or salt on walking paths during winter. Clean up any spills in your garage  right away. This includes oil or grease spills. What can I do in the bathroom? Use night lights. Install grab bars by the toilet and in the tub and shower. Do not use towel bars as grab bars. Use non-skid mats or decals in the tub or shower. If you need to sit down in the shower, use a plastic, non-slip stool. Keep the floor dry. Clean up any water that spills on the floor as soon as it happens. Remove soap buildup in the tub or shower regularly. Attach bath mats securely with double-sided non-slip rug tape. Do not have throw rugs and other things on the floor that can make you trip. What can I do in the bedroom? Use night lights. Make sure that you have a light by your bed that is easy to reach. Do not use any sheets or blankets that are too big for your bed. They should not hang down onto the floor. Have a firm chair that has side arms. You can use this for support while you get dressed. Do not have throw rugs and other things on the floor that can make you trip. What can I do in  the kitchen? Clean up any spills right away. Avoid walking on wet floors. Keep items that you use a lot in easy-to-reach places. If you need to reach something above you, use a strong step stool that has a grab bar. Keep electrical cords out of the way. Do not use floor polish or wax that makes floors slippery. If you must use wax, use non-skid floor wax. Do not have throw rugs and other things on the floor that can make you trip. What can I do with my stairs? Do not leave any items on the stairs. Make sure that there are handrails on both sides of the stairs and use them. Fix handrails that are broken or loose. Make sure that handrails are as long as the stairways. Check any carpeting to make sure that it is firmly attached to the stairs. Fix any carpet that is loose or worn. Avoid having throw rugs at the top or bottom of the stairs. If you do have throw rugs, attach them to the floor with carpet tape. Make  sure that you have a light switch at the top of the stairs and the bottom of the stairs. If you do not have them, ask someone to add them for you. What else can I do to help prevent falls? Wear shoes that: Do not have high heels. Have rubber bottoms. Are comfortable and fit you well. Are closed at the toe. Do not wear sandals. If you use a stepladder: Make sure that it is fully opened. Do not climb a closed stepladder. Make sure that both sides of the stepladder are locked into place. Ask someone to hold it for you, if possible. Clearly mark and make sure that you can see: Any grab bars or handrails. First and last steps. Where the edge of each step is. Use tools that help you move around (mobility aids) if they are needed. These include: Canes. Walkers. Scooters. Crutches. Turn on the lights when you go into a dark area. Replace any light bulbs as soon as they burn out. Set up your furniture so you have a clear path. Avoid moving your furniture around. If any of your floors are uneven, fix them. If there are any pets around you, be aware of where they are. Review your medicines with your doctor. Some medicines can make you feel dizzy. This can increase your chance of falling. Ask your doctor what other things that you can do to help prevent falls. This information is not intended to replace advice given to you by your health care provider. Make sure you discuss any questions you have with your health care provider. Document Released: 01/30/2009 Document Revised: 09/11/2015 Document Reviewed: 05/10/2014 Elsevier Interactive Patient Education  2017 [employer].

## 2022-01-13 DIAGNOSIS — R3911 Hesitancy of micturition: Secondary | ICD-10-CM | POA: Diagnosis not present

## 2022-02-01 ENCOUNTER — Ambulatory Visit: Payer: Medicare Other | Admitting: Podiatry

## 2022-02-03 ENCOUNTER — Encounter: Payer: Self-pay | Admitting: Internal Medicine

## 2022-02-05 ENCOUNTER — Ambulatory Visit: Payer: Medicare Other | Admitting: Family Medicine

## 2022-02-09 ENCOUNTER — Ambulatory Visit (INDEPENDENT_AMBULATORY_CARE_PROVIDER_SITE_OTHER): Payer: Medicare Other | Admitting: Internal Medicine

## 2022-02-09 ENCOUNTER — Encounter: Payer: Self-pay | Admitting: Internal Medicine

## 2022-02-09 ENCOUNTER — Ambulatory Visit (INDEPENDENT_AMBULATORY_CARE_PROVIDER_SITE_OTHER): Payer: Medicare Other | Admitting: Psychologist

## 2022-02-09 ENCOUNTER — Ambulatory Visit (INDEPENDENT_AMBULATORY_CARE_PROVIDER_SITE_OTHER): Payer: Medicare Other

## 2022-02-09 VITALS — BP 126/70 | HR 84 | Temp 98.6°F | Ht 74.0 in | Wt 166.0 lb

## 2022-02-09 DIAGNOSIS — F331 Major depressive disorder, recurrent, moderate: Secondary | ICD-10-CM | POA: Diagnosis not present

## 2022-02-09 DIAGNOSIS — M5030 Other cervical disc degeneration, unspecified cervical region: Secondary | ICD-10-CM

## 2022-02-09 DIAGNOSIS — M542 Cervicalgia: Secondary | ICD-10-CM | POA: Diagnosis not present

## 2022-02-09 DIAGNOSIS — M159 Polyosteoarthritis, unspecified: Secondary | ICD-10-CM

## 2022-02-09 DIAGNOSIS — E1140 Type 2 diabetes mellitus with diabetic neuropathy, unspecified: Secondary | ICD-10-CM | POA: Diagnosis not present

## 2022-02-09 MED ORDER — HYDROCODONE-ACETAMINOPHEN 5-325 MG PO TABS: 1.0000 | ORAL_TABLET | Freq: Three times a day (TID) | ORAL | 0 refills | Status: DC | PRN

## 2022-02-09 MED ORDER — TIZANIDINE HCL 2 MG PO TABS: 2.0000 mg | ORAL_TABLET | Freq: Three times a day (TID) | ORAL | 0 refills | Status: DC | PRN

## 2022-02-09 NOTE — Progress Notes (Signed)
Subjective:  Patient ID: Lee Mitchell, male    DOB: [birth year]  Age: 71 y.o. MRN: [id number]  CC: No chief complaint on file.   HPI Lee Mitchell presents for f/up -  He complains of a 2 week history of right neck pain with MSK spasms.  He denies trauma or injury.  He tells me the pain does not radiate towards his extremities and he denies paresthesias.  Outpatient Medications Prior to Visit  Medication Sig Dispense Refill   albuterol (VENTOLIN HFA) 108 (90 Base) MCG/ACT inhaler INHALE 2 PUFFS INTO THE LUNGS EVERY 6 HOURS AS NEEDED FOR WHEEZE 6.7 each 5   Dapagliflozin-metFORMIN HCl ER (XIGDUO XR) 10-500 MG TB24 Take 1 tablet by mouth daily. 90 tablet 1   diclofenac Sodium (VOLTAREN) 1 % GEL Apply 2 g topically 4 (four) times daily. Rub into affected area of foot 2 to 4 times daily 100 g 2   ezetimibe (ZETIA) 10 MG tablet TAKE 1 TABLET BY MOUTH EVERY DAY 90 tablet 1   Fluticasone-Umeclidin-Vilant (TRELEGY ELLIPTA) 100-62.5-25 MCG/ACT AEPB Inhale 1 puff into the lungs daily. 120 each 1   levothyroxine (SYNTHROID) 150 MCG tablet Take 1 tablet (150 mcg total) by mouth daily before breakfast. 90 tablet 0   mirtazapine (REMERON SOL-TAB) 30 MG disintegrating tablet DISSOLVE 1 TABLET UNDER TONGUE AT BEDTIME (Patient taking differently: Take 30 mg by mouth at bedtime. Dissolve 1 tablet ('30mg'$ ) under tongue at bedtime) 90 tablet 0   mirtazapine (REMERON) 30 MG tablet TAKE 1 TABLET BY MOUTH EVERY DAY IN THE EVENING 90 tablet 1   omeprazole (PRILOSEC) 40 MG capsule TAKE 1 CAPSULE (40 MG TOTAL) BY MOUTH DAILY. NEEDS APPT FOR ADDITIONAL REFILLS 90 capsule 0   pregabalin (LYRICA) 75 MG capsule Take 1 capsule (75 mg total) by mouth 2 (two) times daily. 60 capsule 1   rosuvastatin (CRESTOR) 40 MG tablet Take 40 mg by mouth at bedtime.      HYDROcodone-acetaminophen (NORCO/VICODIN) 5-325 MG tablet Take 1 tablet by mouth every 8 (eight) hours as needed for moderate pain. 65 tablet 0   No  facility-administered medications prior to visit.    ROS Review of Systems  Constitutional: Negative.  Negative for diaphoresis and fatigue.  HENT: Negative.    Eyes: Negative.   Respiratory: Negative.  Negative for cough, chest tightness, shortness of breath and wheezing.   Cardiovascular:  Negative for chest pain, palpitations and leg swelling.  Gastrointestinal:  Negative for abdominal pain and diarrhea.  Endocrine: Negative.   Genitourinary: Negative.   Musculoskeletal:  Positive for arthralgias and neck pain. Negative for myalgias.  Skin: Negative.   Neurological:  Negative for dizziness, weakness, light-headedness and numbness.  Hematological:  Negative for adenopathy. Does not bruise/bleed easily.  Psychiatric/Behavioral: Negative.      Objective:  BP 126/70 (BP Location: Left Arm, Patient Position: Sitting, Cuff Size: Large)   Pulse 84   Temp 98.6 F (37 C) (Oral)   Ht '6\' 2"'$  (1.88 m)   Wt 166 lb (75.3 kg)   SpO2 95%   BMI 21.31 kg/m   BP Readings from Last 3 Encounters:  02/09/22 126/70  01/05/22 132/78  12/29/21 102/69    Wt Readings from Last 3 Encounters:  02/09/22 166 lb (75.3 kg)  01/08/22 160 lb (72.6 kg)  01/05/22 160 lb (72.6 kg)    Physical Exam Vitals reviewed.  HENT:     Nose: Nose normal.     Mouth/Throat:     Mouth:  Mucous membranes are moist.  Eyes:     General: No scleral icterus.    Conjunctiva/sclera: Conjunctivae normal.  Cardiovascular:     Rate and Rhythm: Normal rate and regular rhythm.     Heart sounds: No murmur heard. Pulmonary:     Effort: Pulmonary effort is normal.     Breath sounds: No stridor. No wheezing, rhonchi or rales.  Abdominal:     General: Abdomen is flat.     Palpations: There is no mass.     Tenderness: There is no abdominal tenderness. There is no guarding.     Hernia: No hernia is present.  Musculoskeletal:     Cervical back: Neck supple. No signs of trauma. Pain with movement present. No muscular  tenderness. Decreased range of motion.     Right lower leg: No edema.     Left lower leg: No edema.  Skin:    General: Skin is warm.  Neurological:     General: No focal deficit present.     Mental Status: He is alert. Mental status is at baseline.  Psychiatric:        Mood and Affect: Mood normal.        Behavior: Behavior normal.     Lab Results  Component Value Date   WBC 18.4 (H) 12/29/2021   HGB 15.5 12/29/2021   HCT 46.0 12/29/2021   PLT 260 12/29/2021   GLUCOSE 105 (H) 12/29/2021   CHOL 107 11/23/2021   TRIG 76.0 11/23/2021   HDL 40.60 11/23/2021   LDLCALC 52 11/23/2021   ALT 36 12/29/2021   AST 32 12/29/2021   NA 140 12/29/2021   K 4.4 12/29/2021   CL 109 12/29/2021   CREATININE 1.43 (H) 12/29/2021   BUN 16 12/29/2021   CO2 20 (L) 12/29/2021   TSH 5.61 (H) 11/23/2021   PSA 6.20 (H) 11/23/2021   INR 0.9 06/24/2019   HGBA1C 7.3 (H) 11/23/2021   MICROALBUR 2.4 (H) 11/23/2021    CT Angio Chest PE W and/or Wo Contrast  Result Date: 12/29/2021 CLINICAL DATA:  Positive D-dimer.  Difficulty breathing. EXAM: CT ANGIOGRAPHY CHEST WITH CONTRAST TECHNIQUE: Multidetector CT imaging of the chest was performed using the standard protocol during bolus administration of intravenous contrast. Multiplanar CT image reconstructions and MIPs were obtained to evaluate the vascular anatomy. RADIATION DOSE REDUCTION: This exam was performed according to the departmental dose-optimization program which includes automated exposure control, adjustment of the mA and/or kV according to patient size and/or use of iterative reconstruction technique. CONTRAST:  32m OMNIPAQUE IOHEXOL 350 MG/ML SOLN COMPARISON:  Chest x-ray same day. CT angiogram chest 02/21/2018. CT abdomen and pelvis 12/19/2020. FINDINGS: Cardiovascular: Satisfactory opacification of the pulmonary arteries to the segmental level. No evidence of pulmonary embolism. Normal heart size. No pericardial effusion. Mediastinum/Nodes:  Thyroid gland is surgically absent. There are no enlarged mediastinal or hilar lymph nodes. Esophagus is within normal limits. Lungs/Pleura: Mild emphysematous changes are present. There is minimal linear scarring or atelectasis in the lung bases. The lungs are otherwise clear. No pleural effusion or pneumothorax. Upper Abdomen: Hypodense liver lesions measuring up to 3.5 cm are unchanged from prior and were previously characterized as hemangiomas. Musculoskeletal: No chest wall abnormality. No acute or significant osseous findings. Review of the MIP images confirms the above findings. IMPRESSION: 1. No evidence for pulmonary embolism. 2. No acute cardiopulmonary process. 3.  Emphysema (ICD10-J43.9). Electronically Signed   By: ARonney AstersM.D.   On: 12/29/2021 18:28   DG  Chest Portable 1 View  Result Date: 12/29/2021 CLINICAL DATA:  Difficulty breathing. EXAM: PORTABLE CHEST 1 VIEW COMPARISON:  11/23/2021 FINDINGS: The cardiac silhouette, mediastinal and hilar contours within normal limits. Mild hyperinflation and underlying emphysematous changes but no acute overlying pulmonary process. No pleural effusions or pulmonary lesions. No pneumothorax. The bony thorax is intact. Thyroid surgical changes are noted. IMPRESSION: Mild emphysematous changes but no acute pulmonary findings. Electronically Signed   By: Marijo Sanes M.D.   On: 12/29/2021 13:47   DG Cervical Spine Complete  Result Date: 02/09/2022 CLINICAL DATA:  Chronic neck pain.  No known injury. EXAM: CERVICAL SPINE - COMPLETE 4+ VIEW COMPARISON:  Cervical CT, 06/16/2019. FINDINGS: No fracture.  No bone lesion.  No spondylolisthesis. Mild loss of disc height at C3-C4. Moderate loss of disc height at C5-C6 and C6-C7. Endplate spurring noted from C3 through C7. Moderate neural foraminal narrowing on the right at C3-C4, C4-C5 C5-C6 and C6-C7 due predominantly to uncovertebral spurring. Facet and uncovertebral spurring causes moderate neural foraminal  narrowing on the left at C3-C4, C4-C5 and C5-C6 with mild neural foraminal narrowing on the left at C6-C7. Soft tissues are unremarkable. IMPRESSION: 1. No fracture.  No malalignment. 2. Disc and facet degenerative changes as detailed without convincing change from the previous cervical spine CT. Electronically Signed   By: Lajean Manes M.D.   On: 02/09/2022 15:11      Assessment & Plan:   Diagnoses and all orders for this visit:  Neck pain on right side -     DG Cervical Spine Complete; Future  Diabetic neuropathy, painful ([name]) -     Discontinue: HYDROcodone-acetaminophen (NORCO/VICODIN) 5-325 MG tablet; Take 1 tablet by mouth every 8 (eight) hours as needed for moderate pain. -     Discontinue: HYDROcodone-acetaminophen (NORCO/VICODIN) 5-325 MG tablet; Take 1 tablet by mouth every 8 (eight) hours as needed for moderate pain.  Primary osteoarthritis involving multiple joints -     Discontinue: HYDROcodone-acetaminophen (NORCO/VICODIN) 5-325 MG tablet; Take 1 tablet by mouth every 8 (eight) hours as needed for moderate pain. -     Discontinue: HYDROcodone-acetaminophen (NORCO/VICODIN) 5-325 MG tablet; Take 1 tablet by mouth every 8 (eight) hours as needed for moderate pain.  DDD (degenerative disc disease), cervical- He is neurologically intact.  Will treat with a MR, hydrocodone, and acetaminophen. -     tiZANidine (ZANAFLEX) 2 MG tablet; Take 1 tablet (2 mg total) by mouth every 8 (eight) hours as needed for muscle spasms. -     Discontinue: HYDROcodone-acetaminophen (NORCO/VICODIN) 5-325 MG tablet; Take 1 tablet by mouth every 8 (eight) hours as needed for moderate pain.   I have discontinued Honor Loh. Malizia's HYDROcodone-acetaminophen and HYDROcodone-acetaminophen. I am also having him start on tiZANidine. Additionally, I am having him maintain his mirtazapine, rosuvastatin, diclofenac Sodium, pregabalin, Trelegy Ellipta, Xigduo XR, levothyroxine, omeprazole, mirtazapine, ezetimibe,  and albuterol.  Meds ordered this encounter  Medications   DISCONTD: HYDROcodone-acetaminophen (NORCO/VICODIN) 5-325 MG tablet    Sig: Take 1 tablet by mouth every 8 (eight) hours as needed for moderate pain.    Dispense:  65 tablet    Refill:  0   tiZANidine (ZANAFLEX) 2 MG tablet    Sig: Take 1 tablet (2 mg total) by mouth every 8 (eight) hours as needed for muscle spasms.    Dispense:  270 tablet    Refill:  0   DISCONTD: HYDROcodone-acetaminophen (NORCO/VICODIN) 5-325 MG tablet    Sig: Take 1 tablet by mouth every  8 (eight) hours as needed for moderate pain.    Dispense:  65 tablet    Refill:  0     Follow-up: Return in about 3 months (around 05/12/2022).  Scarlette Calico, MD

## 2022-02-09 NOTE — Plan of Care (Signed)
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
## 2022-02-09 NOTE — Progress Notes (Signed)
                Koralee Wedeking, PsyD 

## 2022-02-09 NOTE — Patient Instructions (Signed)

## 2022-02-10 ENCOUNTER — Telehealth: Payer: Self-pay | Admitting: Internal Medicine

## 2022-02-10 NOTE — Telephone Encounter (Signed)
Pt [facility]. The pharmacy informed pt the location at [facility] has it in stock.  [facility] [address]

## 2022-02-12 ENCOUNTER — Other Ambulatory Visit: Payer: Self-pay | Admitting: Internal Medicine

## 2022-02-12 DIAGNOSIS — M5030 Other cervical disc degeneration, unspecified cervical region: Secondary | ICD-10-CM

## 2022-02-12 DIAGNOSIS — E1140 Type 2 diabetes mellitus with diabetic neuropathy, unspecified: Secondary | ICD-10-CM

## 2022-02-12 DIAGNOSIS — M159 Polyosteoarthritis, unspecified: Secondary | ICD-10-CM

## 2022-02-12 MED ORDER — HYDROCODONE-ACETAMINOPHEN 5-325 MG PO TABS: 1.0000 | ORAL_TABLET | Freq: Three times a day (TID) | ORAL | 0 refills | Status: DC | PRN

## 2022-02-24 ENCOUNTER — Ambulatory Visit (INDEPENDENT_AMBULATORY_CARE_PROVIDER_SITE_OTHER): Payer: Medicare Other | Admitting: Psychologist

## 2022-02-24 ENCOUNTER — Other Ambulatory Visit: Payer: Self-pay | Admitting: Internal Medicine

## 2022-02-24 DIAGNOSIS — K219 Gastro-esophageal reflux disease without esophagitis: Secondary | ICD-10-CM

## 2022-02-24 DIAGNOSIS — F331 Major depressive disorder, recurrent, moderate: Secondary | ICD-10-CM

## 2022-02-24 NOTE — Progress Notes (Signed)
[place] Counselor/Therapist Progress Note  Patient ID: Lee Mitchell, MRN: [id number],    Date: 02/24/2022  Time Spent: 09:02 am to 09:44 am; total time: 42 minutes   Treatment Type: Individual Therapy  Reported Symptoms: Distress related to family dynamics  Mental Status Exam: Appearance:  Well Groomed     Behavior: Appropriate  Motor: Normal  Speech/Language:  Clear and Coherent  Affect: Appropriate  Mood: normal  Thought process: normal  Thought content:   WNL  Sensory/Perceptual disturbances:   WNL  Orientation: oriented to person, place, and time/date  Attention: Good  Concentration: Good  Memory: WNL  Fund of knowledge:  Good  Insight:   Poor  Judgment:  Fair  Impulse Control: Good   Risk Assessment: Danger to Self:  No Self-injurious Behavior: No Danger to Others: No Duty to Warn:no Physical Aggression / Violence:No  Access to Firearms a concern: No  Gang Involvement:No   Subjective: Beginning the session, patient voiced that nothing had really changed since the intake. After reviewing the treatment plan, patient spent time doing a life review of difficult relationships he maintains with his siblings and children. He reflected on those challenging dynamics, but had a difficult time understanding some of the choices that his siblings made. He processed thoughts and emotions. He was agreeable to following up. He denied suicidal and homicidal ideation.    Interventions:  Worked on developing a therapeutic relationship with the patient using active listening and reflective statements. Provided emotional support using empathy and validation. Reviewed the treatment plan with the patient. Spent time exploring goals for the session. Worked on developing a therapeutic relationship with the patient processing emotions related to challenging dynamics. Used empathy to assist the patient trust the clinician. Assisted the patient in doing a life review  related to the challenging dynamics he maintains with his siblings and children. Used socratic questions to assist the patient. Attempted to assist with problem solving. Began to explore some of the etiology behind the behaviors of others. Provided empathic statements. Assessed for suicidal and homicidal ideation.   Homework: NA  Next Session: Coping skills  Diagnosis: F33.1 major depressive affective disorder, recurrent, moderate  Plan:   Goals Alleviate depressive symptoms Recognize, accept, and cope with depressive feelings Develop healthy thinking patterns Develop healthy interpersonal relationships  Objectives target date for all objectives is 02/10/2023 Cooperate with a medication evaluation by a physician Verbalize an accurate understanding of depression Verbalize an understanding of the treatment Identify and replace thoughts that support depression Learn and implement behavioral strategies Verbalize an understanding and resolution of current interpersonal problems Learn and implement problem solving and decision making skills Learn and implement conflict resolution skills to resolve interpersonal problems Verbalize an understanding of healthy and unhealthy emotions verbalize insight into how past relationships may be influence current experiences with depression Use mindfulness and acceptance strategies and increase value based behavior  Increase hopeful statements about the future.  Interventions Consistent with treatment model, discuss how change in cognitive, behavioral, and interpersonal can help client alleviate depression CBT Behavioral activation help the client explore the relationship, nature of the dispute,  Help the client develop new interpersonal skills and relationships Conduct Problem so living therapy Teach conflict resolution skills Use a process-experiential approach Conduct TLDP Conduct ACT Evaluate need for psychotropic medication Monitor adherence  to medication   The patient and clinician reviewed the treatment plan on 02/24/2022. The patient approved of the treatment plan.   Conception Chancy, PsyD

## 2022-02-24 NOTE — Progress Notes (Signed)
                Macy Polio, PsyD 

## 2022-03-02 DIAGNOSIS — R3911 Hesitancy of micturition: Secondary | ICD-10-CM | POA: Diagnosis not present

## 2022-03-02 DIAGNOSIS — R3912 Poor urinary stream: Secondary | ICD-10-CM | POA: Diagnosis not present

## 2022-03-05 ENCOUNTER — Other Ambulatory Visit: Payer: Self-pay | Admitting: Internal Medicine

## 2022-03-05 DIAGNOSIS — E890 Postprocedural hypothyroidism: Secondary | ICD-10-CM

## 2022-03-10 ENCOUNTER — Other Ambulatory Visit: Payer: Self-pay | Admitting: Urology

## 2022-03-10 DIAGNOSIS — R972 Elevated prostate specific antigen [PSA]: Secondary | ICD-10-CM

## 2022-03-17 ENCOUNTER — Ambulatory Visit (INDEPENDENT_AMBULATORY_CARE_PROVIDER_SITE_OTHER): Payer: Medicare Other | Admitting: Internal Medicine

## 2022-03-17 ENCOUNTER — Encounter: Payer: Self-pay | Admitting: Internal Medicine

## 2022-03-17 VITALS — BP 132/76 | HR 82 | Temp 98.0°F | Resp 16 | Ht 74.0 in | Wt 170.0 lb

## 2022-03-17 DIAGNOSIS — R195 Other fecal abnormalities: Secondary | ICD-10-CM

## 2022-03-17 DIAGNOSIS — Z720 Tobacco use: Secondary | ICD-10-CM

## 2022-03-17 DIAGNOSIS — E1140 Type 2 diabetes mellitus with diabetic neuropathy, unspecified: Secondary | ICD-10-CM

## 2022-03-17 DIAGNOSIS — I10 Essential (primary) hypertension: Secondary | ICD-10-CM

## 2022-03-17 DIAGNOSIS — E118 Type 2 diabetes mellitus with unspecified complications: Secondary | ICD-10-CM

## 2022-03-17 NOTE — Patient Instructions (Signed)

## 2022-03-17 NOTE — Progress Notes (Addendum)
Subjective:  Patient ID: Lee Mitchell, male    DOB: [birth year]  Age: 71 y.o. MRN: [id number]  CC: Diabetes   HPI Lee Mitchell presents for f/up -  He walks about 3 miles/day and denies chest pain, shortness of breath, diaphoresis, or edema.  His pain is adequately well-controlled with hydrocodone.  Outpatient Medications Prior to Visit  Medication Sig Dispense Refill   albuterol (VENTOLIN HFA) 108 (90 Base) MCG/ACT inhaler INHALE 2 PUFFS INTO THE LUNGS EVERY 6 HOURS AS NEEDED FOR WHEEZE 6.7 each 5   Dapagliflozin-metFORMIN HCl ER (XIGDUO XR) 10-500 MG TB24 Take 1 tablet by mouth daily. 90 tablet 1   diclofenac Sodium (VOLTAREN) 1 % GEL Apply 2 g topically 4 (four) times daily. Rub into affected area of foot 2 to 4 times daily 100 g 2   ezetimibe (ZETIA) 10 MG tablet TAKE 1 TABLET BY MOUTH EVERY DAY 90 tablet 1   Fluticasone-Umeclidin-Vilant (TRELEGY ELLIPTA) 100-62.5-25 MCG/ACT AEPB Inhale 1 puff into the lungs daily. 120 each 1   HYDROcodone-acetaminophen (NORCO/VICODIN) 5-325 MG tablet Take 1 tablet by mouth every 8 (eight) hours as needed for moderate pain. 65 tablet 0   levothyroxine (SYNTHROID) 150 MCG tablet TAKE 1 TABLET BY MOUTH DAILY BEFORE BREAKFAST. 90 tablet 0   mirtazapine (REMERON SOL-TAB) 30 MG disintegrating tablet DISSOLVE 1 TABLET UNDER TONGUE AT BEDTIME (Patient taking differently: Take 30 mg by mouth at bedtime. Dissolve 1 tablet ('30mg'$ ) under tongue at bedtime) 90 tablet 0   mirtazapine (REMERON) 30 MG tablet TAKE 1 TABLET BY MOUTH EVERY DAY IN THE EVENING 90 tablet 1   omeprazole (PRILOSEC) 40 MG capsule TAKE 1 CAPSULE (40 MG TOTAL) BY MOUTH DAILY. NEEDS APPT FOR ADDITIONAL REFILLS 90 capsule 0   pregabalin (LYRICA) 75 MG capsule Take 1 capsule (75 mg total) by mouth 2 (two) times daily. 60 capsule 1   rosuvastatin (CRESTOR) 40 MG tablet Take 40 mg by mouth at bedtime.      tiZANidine (ZANAFLEX) 2 MG tablet Take 1 tablet (2 mg total) by mouth every 8  (eight) hours as needed for muscle spasms. 270 tablet 0   No facility-administered medications prior to visit.    ROS Review of Systems  Constitutional: Negative.  Negative for diaphoresis and fatigue.  HENT: Negative.    Eyes: Negative.   Respiratory:  Negative for cough, chest tightness, shortness of breath and wheezing.   Cardiovascular:  Negative for chest pain, palpitations and leg swelling.  Gastrointestinal:  Negative for abdominal pain, diarrhea, nausea and vomiting.  Endocrine: Negative.   Genitourinary:  Negative for difficulty urinating.  Musculoskeletal:  Positive for arthralgias. Negative for myalgias.  Skin: Negative.   Allergic/Immunologic: Negative.   Neurological: Negative.  Negative for dizziness and weakness.  Hematological:  Negative for adenopathy. Does not bruise/bleed easily.  Psychiatric/Behavioral: Negative.  Negative for decreased concentration, dysphoric mood and sleep disturbance. The patient is not nervous/anxious.     Objective:  BP 132/76 (BP Location: Left Arm, Patient Position: Sitting, Cuff Size: Large)   Pulse 82   Temp 98 F (36.7 C) (Oral)   Resp 16   Ht '6\' 2"'$  (1.88 m)   Wt 170 lb (77.1 kg)   SpO2 93%   BMI 21.83 kg/m   BP Readings from Last 3 Encounters:  03/17/22 132/76  02/09/22 126/70  01/05/22 132/78    Wt Readings from Last 3 Encounters:  03/17/22 170 lb (77.1 kg)  02/09/22 166 lb (75.3 kg)  01/08/22 160  lb (72.6 kg)    Physical Exam Vitals reviewed.  HENT:     Nose: Nose normal.     Mouth/Throat:     Mouth: Mucous membranes are moist.  Eyes:     General: No scleral icterus.    Conjunctiva/sclera: Conjunctivae normal.  Cardiovascular:     Rate and Rhythm: Normal rate and regular rhythm.     Heart sounds: No murmur heard. Pulmonary:     Effort: Pulmonary effort is normal.     Breath sounds: No stridor. No wheezing, rhonchi or rales.  Abdominal:     General: Abdomen is flat.     Palpations: There is no mass.      Tenderness: There is no abdominal tenderness. There is no guarding.     Hernia: No hernia is present.  Musculoskeletal:        General: Normal range of motion.     Cervical back: Neck supple.     Right lower leg: No edema.     Left lower leg: No edema.  Lymphadenopathy:     Cervical: No cervical adenopathy.  Skin:    General: Skin is warm and dry.  Neurological:     General: No focal deficit present.     Mental Status: He is alert.  Psychiatric:        Mood and Affect: Mood normal.        Behavior: Behavior normal.     Lab Results  Component Value Date   WBC 14.8 (H) 03/17/2022   HGB 13.4 03/17/2022   HCT 39.5 03/17/2022   PLT 320.0 03/17/2022   GLUCOSE 105 (H) 12/29/2021   CHOL 107 11/23/2021   TRIG 76.0 11/23/2021   HDL 40.60 11/23/2021   LDLCALC 52 11/23/2021   ALT 36 12/29/2021   AST 32 12/29/2021   NA 140 12/29/2021   K 4.4 12/29/2021   CL 109 12/29/2021   CREATININE 1.43 (H) 12/29/2021   BUN 16 12/29/2021   CO2 20 (L) 12/29/2021   TSH 5.61 (H) 11/23/2021   PSA 6.20 (H) 11/23/2021   INR 0.9 06/24/2019   HGBA1C 7.3 (H) 11/23/2021   MICROALBUR 2.4 (H) 11/23/2021    CT Angio Chest PE W and/or Wo Contrast  Result Date: 12/29/2021 CLINICAL DATA:  Positive D-dimer.  Difficulty breathing. EXAM: CT ANGIOGRAPHY CHEST WITH CONTRAST TECHNIQUE: Multidetector CT imaging of the chest was performed using the standard protocol during bolus administration of intravenous contrast. Multiplanar CT image reconstructions and MIPs were obtained to evaluate the vascular anatomy. RADIATION DOSE REDUCTION: This exam was performed according to the departmental dose-optimization program which includes automated exposure control, adjustment of the mA and/or kV according to patient size and/or use of iterative reconstruction technique. CONTRAST:  77m OMNIPAQUE IOHEXOL 350 MG/ML SOLN COMPARISON:  Chest x-ray same day. CT angiogram chest 02/21/2018. CT abdomen and pelvis 12/19/2020. FINDINGS:  Cardiovascular: Satisfactory opacification of the pulmonary arteries to the segmental level. No evidence of pulmonary embolism. Normal heart size. No pericardial effusion. Mediastinum/Nodes: Thyroid gland is surgically absent. There are no enlarged mediastinal or hilar lymph nodes. Esophagus is within normal limits. Lungs/Pleura: Mild emphysematous changes are present. There is minimal linear scarring or atelectasis in the lung bases. The lungs are otherwise clear. No pleural effusion or pneumothorax. Upper Abdomen: Hypodense liver lesions measuring up to 3.5 cm are unchanged from prior and were previously characterized as hemangiomas. Musculoskeletal: No chest wall abnormality. No acute or significant osseous findings. Review of the MIP images confirms the above findings.  IMPRESSION: 1. No evidence for pulmonary embolism. 2. No acute cardiopulmonary process. 3.  Emphysema (ICD10-J43.9). Electronically Signed   By: Ronney Asters M.D.   On: 12/29/2021 18:28   DG Chest Portable 1 View  Result Date: 12/29/2021 CLINICAL DATA:  Difficulty breathing. EXAM: PORTABLE CHEST 1 VIEW COMPARISON:  11/23/2021 FINDINGS: The cardiac silhouette, mediastinal and hilar contours within normal limits. Mild hyperinflation and underlying emphysematous changes but no acute overlying pulmonary process. No pleural effusions or pulmonary lesions. No pneumothorax. The bony thorax is intact. Thyroid surgical changes are noted. IMPRESSION: Mild emphysematous changes but no acute pulmonary findings. Electronically Signed   By: Marijo Sanes M.D.   On: 12/29/2021 13:47    Assessment & Plan:   Tupac was seen today for diabetes.  Diagnoses and all orders for this visit:  Type II diabetes mellitus with manifestations ([place])- His blood sugar has been well-controlled.  Hypertension, unspecified type- His blood pressure is adequately well-controlled.  His white cell count is elevated due to tobacco abuse. -     CBC with  Differential/Platelet; Future -     CBC with Differential/Platelet  Tobacco abuse  Positive colorectal cancer screening using Cologuard test -     Ambulatory referral to Gastroenterology  Diabetic neuropathy, painful ([place])- Will continue hydrocodone as needed.   I am having Honor Loh. Thieme maintain his mirtazapine, rosuvastatin, diclofenac Sodium, pregabalin, Trelegy Ellipta, Xigduo XR, mirtazapine, ezetimibe, albuterol, tiZANidine, HYDROcodone-acetaminophen, omeprazole, and levothyroxine.  No orders of the defined types were placed in this encounter.    Follow-up: Return in about 4 months (around 07/16/2022).  Scarlette Calico, MD

## 2022-03-18 LAB — CBC WITH DIFFERENTIAL/PLATELET
Basophils Absolute: 0.1 10*3/uL (ref 0.0–0.1)
Basophils Relative: 0.7 % (ref 0.0–3.0)
Eosinophils Absolute: 0.3 10*3/uL (ref 0.0–0.7)
Eosinophils Relative: 1.8 % (ref 0.0–5.0)
HCT: 39.5 % (ref 39.0–52.0)
Hemoglobin: 13.4 g/dL (ref 13.0–17.0)
Lymphocytes Relative: 22.6 % (ref 12.0–46.0)
Lymphs Abs: 3.3 10*3/uL (ref 0.7–4.0)
MCHC: 34 g/dL (ref 30.0–36.0)
MCV: 87.6 fl (ref 78.0–100.0)
Monocytes Absolute: 1.4 10*3/uL — ABNORMAL HIGH (ref 0.1–1.0)
Monocytes Relative: 9.2 % (ref 3.0–12.0)
Neutro Abs: 9.7 10*3/uL — ABNORMAL HIGH (ref 1.4–7.7)
Neutrophils Relative %: 65.7 % (ref 43.0–77.0)
Platelets: 320 10*3/uL (ref 150.0–400.0)
RBC: 4.51 Mil/uL (ref 4.22–5.81)
RDW: 14.1 % (ref 11.5–15.5)
WBC: 14.8 10*3/uL — ABNORMAL HIGH (ref 4.0–10.5)

## 2022-03-19 ENCOUNTER — Ambulatory Visit (INDEPENDENT_AMBULATORY_CARE_PROVIDER_SITE_OTHER): Payer: Medicare Other | Admitting: Psychologist

## 2022-03-19 DIAGNOSIS — F331 Major depressive disorder, recurrent, moderate: Secondary | ICD-10-CM

## 2022-03-19 NOTE — Progress Notes (Signed)
[facility] Counselor/Therapist Progress Note  Patient ID: Lee Mitchell, MRN: [id number],    Date: 03/19/2022  Time Spent: 01:01 pm to 01:40 pm; total time: 39 minutes  Treatment Type: Individual Therapy  Reported Symptoms: Distress related to family dynamics  Mental Status Exam: Appearance:  Well Groomed     Behavior: Appropriate  Motor: Normal  Speech/Language:  Clear and Coherent  Affect: Appropriate  Mood: normal  Thought process: normal  Thought content:   WNL  Sensory/Perceptual disturbances:   WNL  Orientation: oriented to person, place, and time/date  Attention: Good  Concentration: Good  Memory: WNL  Fund of knowledge:  Good  Insight:   Poor  Judgment:  Fair  Impulse Control: Good   Risk Assessment: Danger to Self:  No Self-injurious Behavior: No Danger to Others: No Duty to Warn:no Physical Aggression / Violence:No  Access to Firearms a concern: No  Gang Involvement:No   Subjective: Beginning the session, patient spent the session reflecting on challenges with letting go of the past and learning to move forward. Patient processed thoughts and emotions. Patient was open to the idea of EMDR. He was agreeable to seeing a therapist who provides EMDR.  He denied suicidal and homicidal ideation.    Interventions:  Worked on developing a therapeutic relationship with the patient using active listening and reflective statements. Provided emotional support using empathy and validation. Reviewed the treatment plan with the patient. Processed emotions related to Thanksgiving. Identified goals for the session. Process the idea of moving forward and what that looks like to the patient. Used socratic questions to assist the patient gain insight into self. Challenged some of the thoughts expressed. Attempted to assist the patient. Explored the trauma patient endorsed. Provided psychoeducation about EMDR. Answered questions related to AHD versus trauma versus  bipolar disorder. Processed thoughts and emotions. Discussed next steps. Assessed for suicidal and homicidal ideation.   Homework: NA  Next Session: NA. Patient will look into option for EMDR services   Diagnosis: F33.1 major depressive affective disorder, recurrent, moderate  Plan:   Goals Alleviate depressive symptoms Recognize, accept, and cope with depressive feelings Develop healthy thinking patterns Develop healthy interpersonal relationships  Objectives target date for all objectives is 02/10/2023 Cooperate with a medication evaluation by a physician Verbalize an accurate understanding of depression Verbalize an understanding of the treatment Identify and replace thoughts that support depression Learn and implement behavioral strategies Verbalize an understanding and resolution of current interpersonal problems Learn and implement problem solving and decision making skills Learn and implement conflict resolution skills to resolve interpersonal problems Verbalize an understanding of healthy and unhealthy emotions verbalize insight into how past relationships may be influence current experiences with depression Use mindfulness and acceptance strategies and increase value based behavior  Increase hopeful statements about the future.  Interventions Consistent with treatment model, discuss how change in cognitive, behavioral, and interpersonal can help client alleviate depression CBT Behavioral activation help the client explore the relationship, nature of the dispute,  Help the client develop new interpersonal skills and relationships Conduct Problem so living therapy Teach conflict resolution skills Use a process-experiential approach Conduct TLDP Conduct ACT Evaluate need for psychotropic medication Monitor adherence to medication   The patient and clinician reviewed the treatment plan on 02/24/2022. The patient approved of the treatment plan.   Conception Chancy,  PsyD

## 2022-03-24 ENCOUNTER — Telehealth: Payer: Self-pay | Admitting: Internal Medicine

## 2022-03-24 ENCOUNTER — Other Ambulatory Visit: Payer: Self-pay | Admitting: Internal Medicine

## 2022-03-24 DIAGNOSIS — M159 Polyosteoarthritis, unspecified: Secondary | ICD-10-CM

## 2022-03-24 DIAGNOSIS — M5030 Other cervical disc degeneration, unspecified cervical region: Secondary | ICD-10-CM

## 2022-03-24 DIAGNOSIS — E1140 Type 2 diabetes mellitus with diabetic neuropathy, unspecified: Secondary | ICD-10-CM

## 2022-03-24 MED ORDER — HYDROCODONE-ACETAMINOPHEN 5-325 MG PO TABS: 1.0000 | ORAL_TABLET | Freq: Three times a day (TID) | ORAL | 0 refills | Status: DC | PRN

## 2022-03-24 NOTE — Telephone Encounter (Signed)
Patient would like a refill on his hydrocodone - [facility] [address]  Last visit:  03/17/2022  Phone:  [phone]

## 2022-04-10 ENCOUNTER — Ambulatory Visit
Admission: RE | Admit: 2022-04-10 | Discharge: 2022-04-10 | Disposition: A | Discharge status: Home / Self Care | Payer: Medicare Other | Source: Ambulatory Visit | Attending: Urology | Admitting: Urology

## 2022-04-10 DIAGNOSIS — R972 Elevated prostate specific antigen [PSA]: Secondary | ICD-10-CM

## 2022-04-10 MED ORDER — GADOPICLENOL 0.5 MMOL/ML IV SOLN
7.5000 mL | Freq: Once | INTRAVENOUS | Status: AC | PRN
  Administered 2022-04-10: 7.5 mL via INTRAVENOUS

## 2022-04-14 ENCOUNTER — Ambulatory Visit (INDEPENDENT_AMBULATORY_CARE_PROVIDER_SITE_OTHER): Payer: Medicare Other | Admitting: Internal Medicine

## 2022-04-14 ENCOUNTER — Ambulatory Visit (INDEPENDENT_AMBULATORY_CARE_PROVIDER_SITE_OTHER): Payer: Medicare Other

## 2022-04-14 ENCOUNTER — Encounter: Payer: Self-pay | Admitting: Internal Medicine

## 2022-04-14 VITALS — BP 132/74 | HR 92 | Temp 98.1°F | Resp 16 | Ht 74.0 in | Wt 170.0 lb

## 2022-04-14 DIAGNOSIS — E890 Postprocedural hypothyroidism: Secondary | ICD-10-CM

## 2022-04-14 DIAGNOSIS — M5030 Other cervical disc degeneration, unspecified cervical region: Secondary | ICD-10-CM

## 2022-04-14 DIAGNOSIS — R052 Subacute cough: Secondary | ICD-10-CM

## 2022-04-14 DIAGNOSIS — M159 Polyosteoarthritis, unspecified: Secondary | ICD-10-CM | POA: Diagnosis not present

## 2022-04-14 DIAGNOSIS — E1140 Type 2 diabetes mellitus with diabetic neuropathy, unspecified: Secondary | ICD-10-CM

## 2022-04-14 DIAGNOSIS — R059 Cough, unspecified: Secondary | ICD-10-CM | POA: Diagnosis not present

## 2022-04-14 DIAGNOSIS — R6883 Chills (without fever): Secondary | ICD-10-CM | POA: Insufficient documentation

## 2022-04-14 DIAGNOSIS — F1210 Cannabis abuse, uncomplicated: Secondary | ICD-10-CM

## 2022-04-14 DIAGNOSIS — J439 Emphysema, unspecified: Secondary | ICD-10-CM | POA: Diagnosis not present

## 2022-04-14 DIAGNOSIS — E118 Type 2 diabetes mellitus with unspecified complications: Secondary | ICD-10-CM | POA: Diagnosis not present

## 2022-04-14 DIAGNOSIS — I10 Essential (primary) hypertension: Secondary | ICD-10-CM

## 2022-04-14 DIAGNOSIS — Z79891 Long term (current) use of opiate analgesic: Secondary | ICD-10-CM

## 2022-04-14 DIAGNOSIS — R0602 Shortness of breath: Secondary | ICD-10-CM | POA: Diagnosis not present

## 2022-04-14 NOTE — Progress Notes (Signed)
Subjective:  Patient ID: Lee Mitchell, male    DOB: October 18, 1950  Age: 71 y.o. MRN: [id number]  CC: Cough and Diabetes   HPI MARVELL TAMER presents for f/up -  He complains of a 1 week history of nonproductive cough and chills with no fever.  He is active and denies chest pain, shortness of breath, diaphoresis, or edema.  Outpatient Medications Prior to Visit  Medication Sig Dispense Refill   albuterol (VENTOLIN HFA) 108 (90 Base) MCG/ACT inhaler INHALE 2 PUFFS INTO THE LUNGS EVERY 6 HOURS AS NEEDED FOR WHEEZE 6.7 each 5   Dapagliflozin-metFORMIN HCl ER (XIGDUO XR) 10-500 MG TB24 Take 1 tablet by mouth daily. 90 tablet 1   diclofenac Sodium (VOLTAREN) 1 % GEL Apply 2 g topically 4 (four) times daily. Rub into affected area of foot 2 to 4 times daily 100 g 2   ezetimibe (ZETIA) 10 MG tablet TAKE 1 TABLET BY MOUTH EVERY DAY 90 tablet 1   Fluticasone-Umeclidin-Vilant (TRELEGY ELLIPTA) 100-62.5-25 MCG/ACT AEPB Inhale 1 puff into the lungs daily. 120 each 1   levothyroxine (SYNTHROID) 150 MCG tablet TAKE 1 TABLET BY MOUTH DAILY BEFORE BREAKFAST. 90 tablet 0   mirtazapine (REMERON) 30 MG tablet TAKE 1 TABLET BY MOUTH EVERY DAY IN THE EVENING 90 tablet 1   omeprazole (PRILOSEC) 40 MG capsule TAKE 1 CAPSULE (40 MG TOTAL) BY MOUTH DAILY. NEEDS APPT FOR ADDITIONAL REFILLS 90 capsule 0   rosuvastatin (CRESTOR) 40 MG tablet Take 40 mg by mouth at bedtime.      tamsulosin (FLOMAX) 0.4 MG CAPS capsule Take 0.4 mg by mouth daily.     tiZANidine (ZANAFLEX) 2 MG tablet Take 1 tablet (2 mg total) by mouth every 8 (eight) hours as needed for muscle spasms. 270 tablet 0   HYDROcodone-acetaminophen (NORCO/VICODIN) 5-325 MG tablet Take 1 tablet by mouth every 8 (eight) hours as needed for moderate pain. 65 tablet 0   mirtazapine (REMERON SOL-TAB) 30 MG disintegrating tablet DISSOLVE 1 TABLET UNDER TONGUE AT BEDTIME (Patient taking differently: Take 30 mg by mouth at bedtime. Dissolve 1 tablet ('30mg'$ )  under tongue at bedtime) 90 tablet 0   pregabalin (LYRICA) 75 MG capsule Take 1 capsule (75 mg total) by mouth 2 (two) times daily. 60 capsule 1   No facility-administered medications prior to visit.    ROS Review of Systems  Constitutional:  Positive for chills. Negative for fatigue and fever.  HENT: Negative.    Eyes: Negative.   Respiratory:  Positive for cough. Negative for chest tightness, shortness of breath and wheezing.   Cardiovascular:  Negative for chest pain, palpitations and leg swelling.  Gastrointestinal:  Negative for abdominal pain, constipation, diarrhea, nausea and vomiting.  Genitourinary: Negative.  Negative for difficulty urinating.  Musculoskeletal:  Positive for arthralgias and neck pain. Negative for myalgias.  Skin: Negative.  Negative for color change and pallor.  Neurological: Negative.  Negative for dizziness, weakness and light-headedness.  Hematological:  Negative for adenopathy. Does not bruise/bleed easily.  Psychiatric/Behavioral: Negative.      Objective:  BP 132/74 (BP Location: Left Arm, Patient Position: Sitting, Cuff Size: Large)   Pulse 92   Temp 98.1 F (36.7 C) (Oral)   Resp 16   Ht '6\' 2"'$  (1.88 m)   Wt 170 lb (77.1 kg)   SpO2 93%   BMI 21.83 kg/m   BP Readings from Last 3 Encounters:  04/14/22 132/74  03/17/22 132/76  02/09/22 126/70    Wt Readings from  Last 3 Encounters:  04/14/22 170 lb (77.1 kg)  03/17/22 170 lb (77.1 kg)  02/09/22 166 lb (75.3 kg)    Physical Exam Vitals reviewed.  Constitutional:      General: He is not in acute distress.    Appearance: He is not ill-appearing, toxic-appearing or diaphoretic.  HENT:     Nose: Nose normal.  Eyes:     General: No scleral icterus.    Conjunctiva/sclera: Conjunctivae normal.  Cardiovascular:     Rate and Rhythm: Normal rate and regular rhythm.     Heart sounds: No murmur heard. Pulmonary:     Effort: Pulmonary effort is normal.     Breath sounds: No stridor. No  wheezing, rhonchi or rales.  Abdominal:     General: Abdomen is flat.     Palpations: There is no mass.     Tenderness: There is no abdominal tenderness. There is no guarding.     Hernia: No hernia is present.  Musculoskeletal:        General: Normal range of motion.     Cervical back: Neck supple.     Right lower leg: No edema.     Left lower leg: No edema.  Lymphadenopathy:     Cervical: No cervical adenopathy.  Skin:    General: Skin is warm and dry.  Neurological:     General: No focal deficit present.     Mental Status: He is alert. Mental status is at baseline.  Psychiatric:        Mood and Affect: Mood normal.        Behavior: Behavior normal.     Lab Results  Component Value Date   WBC 14.8 (H) 03/17/2022   HGB 13.4 03/17/2022   HCT 39.5 03/17/2022   PLT 320.0 03/17/2022   GLUCOSE 211 (H) 04/15/2022   CHOL 107 11/23/2021   TRIG 76.0 11/23/2021   HDL 40.60 11/23/2021   LDLCALC 52 11/23/2021   ALT 36 12/29/2021   AST 32 12/29/2021   NA 138 04/15/2022   K 4.1 04/15/2022   CL 106 04/15/2022   CREATININE 1.05 04/15/2022   BUN 20 04/15/2022   CO2 25 04/15/2022   TSH 0.65 04/15/2022   PSA 6.20 (H) 11/23/2021   INR 0.9 06/24/2019   HGBA1C 7.2 (H) 04/15/2022   MICROALBUR 2.4 (H) 11/23/2021    MR PROSTATE W WO CONTRAST  Result Date: 04/13/2022 CLINICAL DATA:  Elevated PSA. EXAM: MR PROSTATE WITHOUT AND WITH CONTRAST TECHNIQUE: Multiplanar multisequence MRI images were obtained of the pelvis centered about the prostate. Pre and post contrast images were obtained. CONTRAST:  8 mL Vueway COMPARISON:  None Available. FINDINGS: Prostate: -- Peripheral Zone: No focal lesion seen on ADC or high b-value DWI sequences. -- Transition/Central Zone: Mildly enlarged with diffuse involvement by BPH nodules. No non-circumscribed or otherwise suspicious appearing nodules identified. -- Measurements/Volume:  5.1 by 3.7 x 4.9 cm (volume = 48 cm^3) Transcapsular spread:  Absent  Seminal vesicle involvement:  Absent Neurovascular bundle involvement:  Absent Pelvic adenopathy: None visualized Bone metastasis: None visualized. Artifact from lumbosacral spine fusion hardware noted. Other: Diffuse bladder wall thickening, consistent with chronic bladder outlet obstruction. Sigmoid diverticulosis, without evidence of diverticulitis. IMPRESSION: No radiographic evidence of high-grade prostate carcinoma. PI-RADS 1 (v2.1): Very Low (clinically significant cancer highly unlikely) Electronically Signed   By: Marlaine Hind M.D.   On: 04/13/2022 15:18   DG Chest 2 View  Result Date: 04/14/2022 CLINICAL DATA:  Cough, chills, congestion, and  shortness of breath. EXAM: CHEST - 2 VIEW COMPARISON:  Chest radiographs and CTA 12/29/2021 FINDINGS: The cardiomediastinal silhouette is unchanged with normal heart size. The lungs are hyperinflated with mild underlying emphysema. Minimal lung scarring is noted. No acute airspace consolidation, edema, pleural effusion, or pneumothorax is identified. No acute osseous abnormality is seen. IMPRESSION: No active cardiopulmonary disease. Electronically Signed   By: Logan Bores M.D.   On: 04/14/2022 09:56      Assessment & Plan:   Jerome was seen today for cough and diabetes.  Diagnoses and all orders for this visit:  Subacute cough- Chest x-ray is negative for mass or infiltrate.  This is consistent with a viral URI. -     DG Chest 2 View; Future  Chills without fever -     DG Chest 2 View; Future  Long-term current use of opiate analgesic-we will monitor for compliance. -     Urine drugs of abuse scrn w alc, routine (Ref Lab); Future -     Urine drugs of abuse scrn w alc, routine (Ref Lab)  Cannabis abuse -     Urine drugs of abuse scrn w alc, routine (Ref Lab); Future -     Urine drugs of abuse scrn w alc, routine (Ref Lab)  Postoperative hypothyroidism- He is euthyroid. -     TSH; Future  Type II diabetes mellitus with manifestations  ([facility])- His blood sugar is well-controlled. -     Basic metabolic panel; Future -     Hemoglobin A1c; Future  Hypertension, unspecified type- His blood pressure is well-controlled. -     Basic metabolic panel; Future  Diabetic neuropathy, painful ([name]) -     HYDROcodone-acetaminophen (NORCO/VICODIN) 5-325 MG tablet; Take 1 tablet by mouth every 8 (eight) hours as needed for moderate pain.  DDD (degenerative disc disease), cervical -     HYDROcodone-acetaminophen (NORCO/VICODIN) 5-325 MG tablet; Take 1 tablet by mouth every 8 (eight) hours as needed for moderate pain.  Primary osteoarthritis involving multiple joints -     HYDROcodone-acetaminophen (NORCO/VICODIN) 5-325 MG tablet; Take 1 tablet by mouth every 8 (eight) hours as needed for moderate pain.   I have discontinued Honor Loh. Valbuena's pregabalin. I am also having him maintain his rosuvastatin, diclofenac Sodium, Trelegy Ellipta, Xigduo XR, mirtazapine, ezetimibe, albuterol, tiZANidine, omeprazole, levothyroxine, tamsulosin, and HYDROcodone-acetaminophen.  Meds ordered this encounter  Medications   HYDROcodone-acetaminophen (NORCO/VICODIN) 5-325 MG tablet    Sig: Take 1 tablet by mouth every 8 (eight) hours as needed for moderate pain.    Dispense:  65 tablet    Refill:  0     Follow-up: No follow-ups on file.  Scarlette Calico, MD

## 2022-04-15 ENCOUNTER — Telehealth: Payer: Self-pay | Admitting: Internal Medicine

## 2022-04-15 ENCOUNTER — Other Ambulatory Visit (INDEPENDENT_AMBULATORY_CARE_PROVIDER_SITE_OTHER): Payer: Medicare Other

## 2022-04-15 DIAGNOSIS — I10 Essential (primary) hypertension: Secondary | ICD-10-CM | POA: Diagnosis not present

## 2022-04-15 DIAGNOSIS — E118 Type 2 diabetes mellitus with unspecified complications: Secondary | ICD-10-CM

## 2022-04-15 DIAGNOSIS — E890 Postprocedural hypothyroidism: Secondary | ICD-10-CM

## 2022-04-15 LAB — BASIC METABOLIC PANEL
BUN: 20 mg/dL (ref 6–23)
CO2: 25 mEq/L (ref 19–32)
Calcium: 8.1 mg/dL — ABNORMAL LOW (ref 8.4–10.5)
Chloride: 106 mEq/L (ref 96–112)
Creatinine, Ser: 1.05 mg/dL (ref 0.40–1.50)
GFR: 72.56 mL/min (ref >60.00)
Glucose, Bld: 211 mg/dL — ABNORMAL HIGH (ref 70–99)
Potassium: 4.1 mEq/L (ref 3.5–5.1)
Sodium: 138 mEq/L (ref 135–145)

## 2022-04-15 LAB — HEMOGLOBIN A1C: Hgb A1c MFr Bld: 7.2 % — ABNORMAL HIGH (ref 4.6–6.5)

## 2022-04-15 NOTE — Telephone Encounter (Signed)
Caller & Relationship to patient: Self  Call back number: [phone]   Date of last office visit: 12.27.23  Date of next office visit: N/A  Medication(s) to be refilled:  HYDROcodone-acetaminophen (NORCO/VICODIN) 5-325 MG tablet   [facility] Pharmacy:   [id number]  Phone: [phone] Fax: [phone]

## 2022-04-16 LAB — TSH: TSH: 0.65 u[IU]/mL (ref 0.35–5.50)

## 2022-04-16 MED ORDER — HYDROCODONE-ACETAMINOPHEN 5-325 MG PO TABS: 1.0000 | ORAL_TABLET | Freq: Three times a day (TID) | ORAL | 0 refills | Status: DC | PRN

## 2022-04-17 ENCOUNTER — Encounter: Payer: Self-pay | Admitting: Internal Medicine

## 2022-04-18 LAB — URINE DRUGS OF ABUSE SCREEN W ALC, ROUTINE (REF LAB)
Amphetamines, Urine: NEGATIVE ng/mL
Barbiturate Quant, Ur: NEGATIVE ng/mL
Cocaine (Metab.): NEGATIVE ng/mL
Ethanol, Urine: NEGATIVE %
Methadone Screen, Urine: NEGATIVE ng/mL
Opiate Quant, Ur: NEGATIVE ng/mL
PCP Quant, Ur: NEGATIVE ng/mL
Propoxyphene: NEGATIVE ng/mL

## 2022-04-18 LAB — PANEL 799049
CARBOXY THC GC/MS CONF: >750 ng/mL
Cannabinoid GC/MS, Ur: POSITIVE — AB

## 2022-04-18 LAB — DRUG PROFILE 799031: BENZODIAZEPINES: NEGATIVE

## 2022-04-30 ENCOUNTER — Other Ambulatory Visit: Payer: Self-pay | Admitting: Internal Medicine

## 2022-05-18 ENCOUNTER — Encounter: Payer: Self-pay | Admitting: Internal Medicine

## 2022-05-19 ENCOUNTER — Other Ambulatory Visit: Payer: Self-pay | Admitting: Internal Medicine

## 2022-05-19 DIAGNOSIS — E785 Hyperlipidemia, unspecified: Secondary | ICD-10-CM

## 2022-05-19 MED ORDER — ROSUVASTATIN CALCIUM 40 MG PO TABS: 40.0000 mg | ORAL_TABLET | Freq: Every day | ORAL | 0 refills | Status: DC

## 2022-05-21 ENCOUNTER — Other Ambulatory Visit: Payer: Self-pay | Admitting: Internal Medicine

## 2022-05-21 DIAGNOSIS — M159 Polyosteoarthritis, unspecified: Secondary | ICD-10-CM

## 2022-05-21 DIAGNOSIS — E1140 Type 2 diabetes mellitus with diabetic neuropathy, unspecified: Secondary | ICD-10-CM

## 2022-05-21 DIAGNOSIS — M5030 Other cervical disc degeneration, unspecified cervical region: Secondary | ICD-10-CM

## 2022-05-21 MED ORDER — HYDROCODONE-ACETAMINOPHEN 5-325 MG PO TABS: 1.0000 | ORAL_TABLET | Freq: Three times a day (TID) | ORAL | 0 refills | Status: DC | PRN

## 2022-05-26 ENCOUNTER — Other Ambulatory Visit: Payer: Self-pay | Admitting: Internal Medicine

## 2022-05-26 DIAGNOSIS — K219 Gastro-esophageal reflux disease without esophagitis: Secondary | ICD-10-CM

## 2022-05-30 ENCOUNTER — Other Ambulatory Visit: Payer: Self-pay | Admitting: Internal Medicine

## 2022-06-11 ENCOUNTER — Other Ambulatory Visit: Payer: Self-pay | Admitting: Internal Medicine

## 2022-06-11 DIAGNOSIS — E890 Postprocedural hypothyroidism: Secondary | ICD-10-CM

## 2022-06-14 ENCOUNTER — Telehealth: Payer: Self-pay | Admitting: Internal Medicine

## 2022-06-14 NOTE — Telephone Encounter (Signed)
MEDICATION:HYDROcodone-acetaminophen (NORCO/VICODIN) 5-325 MG tablet   [id number] [place] [facility] [address]   Comments:   **Let patient know to contact pharmacy at the end of the day to make sure medication is ready. **  ** Please notify patient to allow 48-72 hours to process**  **Encourage patient to contact the pharmacy for refills or [facility]

## 2022-06-16 ENCOUNTER — Other Ambulatory Visit: Payer: Self-pay | Admitting: Internal Medicine

## 2022-06-16 DIAGNOSIS — E1140 Type 2 diabetes mellitus with diabetic neuropathy, unspecified: Secondary | ICD-10-CM

## 2022-06-16 DIAGNOSIS — M150 Primary generalized (osteo)arthritis: Secondary | ICD-10-CM

## 2022-06-16 DIAGNOSIS — M159 Polyosteoarthritis, unspecified: Secondary | ICD-10-CM

## 2022-06-16 DIAGNOSIS — M5030 Other cervical disc degeneration, unspecified cervical region: Secondary | ICD-10-CM

## 2022-06-16 MED ORDER — HYDROCODONE-ACETAMINOPHEN 5-325 MG PO TABS: 1.0000 | ORAL_TABLET | Freq: Three times a day (TID) | ORAL | 0 refills | Status: DC | PRN

## 2022-06-16 NOTE — Telephone Encounter (Signed)
Patient called back and is requesting this refill be done today if at all possible

## 2022-06-17 DIAGNOSIS — R3912 Poor urinary stream: Secondary | ICD-10-CM | POA: Diagnosis not present

## 2022-07-13 ENCOUNTER — Telehealth: Payer: Self-pay | Admitting: Internal Medicine

## 2022-07-13 NOTE — Telephone Encounter (Signed)
Prescription Request  07/13/2022  LOV: 04/14/2022  What is the name of the medication or equipment? HYDROcodone-acetaminophen (NORCO/VICODIN) 5-325 MG tablet   Have you contacted your pharmacy to request a refill? No   Which pharmacy would you like this sent to?  [id number] [place], [address] [place] [address] Phone: [phone] Fax: [phone]   Patient notified that their request is being sent to the clinical staff for review and that they should receive a response within 2 business days.   Please advise at Mobile [phone] (mobile)

## 2022-07-14 ENCOUNTER — Other Ambulatory Visit: Payer: Self-pay | Admitting: Internal Medicine

## 2022-07-14 DIAGNOSIS — J432 Centrilobular emphysema: Secondary | ICD-10-CM

## 2022-07-14 DIAGNOSIS — J4489 Other specified chronic obstructive pulmonary disease: Secondary | ICD-10-CM

## 2022-07-14 DIAGNOSIS — M5030 Other cervical disc degeneration, unspecified cervical region: Secondary | ICD-10-CM

## 2022-07-14 DIAGNOSIS — E1140 Type 2 diabetes mellitus with diabetic neuropathy, unspecified: Secondary | ICD-10-CM

## 2022-07-14 DIAGNOSIS — M159 Polyosteoarthritis, unspecified: Secondary | ICD-10-CM

## 2022-07-14 MED ORDER — HYDROCODONE-ACETAMINOPHEN 5-325 MG PO TABS: 1.0000 | ORAL_TABLET | Freq: Three times a day (TID) | ORAL | 0 refills | Status: DC | PRN

## 2022-08-09 ENCOUNTER — Telehealth: Payer: Self-pay | Admitting: Internal Medicine

## 2022-08-09 NOTE — Telephone Encounter (Signed)
Called pt he states he has made appt for Thursday 4/25 @ 2:30.Marland KitchenRaechel Chute

## 2022-08-09 NOTE — Telephone Encounter (Signed)
Prescription Request  08/09/2022  LOV: 04/14/2022  What is the name of the medication or equipment? hydrocodone  Have you contacted your pharmacy to request a refill? No   Which pharmacy would you like this sent to?  [id number] [place], [place] - [address] [place] [address] Phone: [phone] Fax: [phone]   Patient notified that their request is being sent to the clinical staff for review and that they should receive a response within 2 business days.   Please advise at Mobile [phone] (mobile)

## 2022-08-09 NOTE — Telephone Encounter (Signed)
He is due for an appt

## 2022-08-12 ENCOUNTER — Ambulatory Visit (INDEPENDENT_AMBULATORY_CARE_PROVIDER_SITE_OTHER): Payer: Medicare Other

## 2022-08-12 ENCOUNTER — Encounter: Payer: Self-pay | Admitting: Internal Medicine

## 2022-08-12 ENCOUNTER — Ambulatory Visit (INDEPENDENT_AMBULATORY_CARE_PROVIDER_SITE_OTHER): Payer: Medicare Other | Admitting: Internal Medicine

## 2022-08-12 VITALS — BP 110/68 | HR 90 | Temp 98.6°F | Ht 74.0 in | Wt 167.0 lb

## 2022-08-12 DIAGNOSIS — E118 Type 2 diabetes mellitus with unspecified complications: Secondary | ICD-10-CM | POA: Diagnosis not present

## 2022-08-12 DIAGNOSIS — R059 Cough, unspecified: Secondary | ICD-10-CM | POA: Diagnosis not present

## 2022-08-12 DIAGNOSIS — I10 Essential (primary) hypertension: Secondary | ICD-10-CM | POA: Diagnosis not present

## 2022-08-12 DIAGNOSIS — B029 Zoster without complications: Secondary | ICD-10-CM | POA: Insufficient documentation

## 2022-08-12 DIAGNOSIS — J439 Emphysema, unspecified: Secondary | ICD-10-CM | POA: Diagnosis not present

## 2022-08-12 DIAGNOSIS — R051 Acute cough: Secondary | ICD-10-CM | POA: Diagnosis not present

## 2022-08-12 DIAGNOSIS — B028 Zoster with other complications: Secondary | ICD-10-CM | POA: Diagnosis not present

## 2022-08-12 DIAGNOSIS — R972 Elevated prostate specific antigen [PSA]: Secondary | ICD-10-CM | POA: Diagnosis not present

## 2022-08-12 DIAGNOSIS — R0602 Shortness of breath: Secondary | ICD-10-CM | POA: Diagnosis not present

## 2022-08-12 DIAGNOSIS — E785 Hyperlipidemia, unspecified: Secondary | ICD-10-CM

## 2022-08-12 DIAGNOSIS — R079 Chest pain, unspecified: Secondary | ICD-10-CM | POA: Diagnosis not present

## 2022-08-12 LAB — BASIC METABOLIC PANEL
BUN: 23 mg/dL (ref 6–23)
CO2: 27 mEq/L (ref 19–32)
Calcium: 9.3 mg/dL (ref 8.4–10.5)
Chloride: 103 mEq/L (ref 96–112)
Creatinine, Ser: 1.07 mg/dL (ref 0.40–1.50)
GFR: 70.77 mL/min (ref >60.00)
Glucose, Bld: 110 mg/dL — ABNORMAL HIGH (ref 70–99)
Potassium: 4.8 mEq/L (ref 3.5–5.1)
Sodium: 140 mEq/L (ref 135–145)

## 2022-08-12 LAB — CBC WITH DIFFERENTIAL/PLATELET
Basophils Absolute: 0.1 10*3/uL (ref 0.0–0.1)
Basophils Relative: 1.2 % (ref 0.0–3.0)
Eosinophils Absolute: 0.1 10*3/uL (ref 0.0–0.7)
Eosinophils Relative: 1 % (ref 0.0–5.0)
HCT: 43.3 % (ref 39.0–52.0)
Hemoglobin: 14.4 g/dL (ref 13.0–17.0)
Lymphocytes Relative: 20.3 % (ref 12.0–46.0)
Lymphs Abs: 2.5 10*3/uL (ref 0.7–4.0)
MCHC: 33.2 g/dL (ref 30.0–36.0)
MCV: 85.9 fl (ref 78.0–100.0)
Monocytes Absolute: 0.8 10*3/uL (ref 0.1–1.0)
Monocytes Relative: 6.2 % (ref 3.0–12.0)
Neutro Abs: 8.8 10*3/uL — ABNORMAL HIGH (ref 1.4–7.7)
Neutrophils Relative %: 71.3 % (ref 43.0–77.0)
Platelets: 406 10*3/uL — ABNORMAL HIGH (ref 150.0–400.0)
RBC: 5.04 Mil/uL (ref 4.22–5.81)
RDW: 16 % — ABNORMAL HIGH (ref 11.5–15.5)
WBC: 12.3 10*3/uL — ABNORMAL HIGH (ref 4.0–10.5)

## 2022-08-12 LAB — PSA: PSA: 7.63 ng/mL — ABNORMAL HIGH (ref 0.10–4.00)

## 2022-08-12 LAB — HEMOGLOBIN A1C: Hgb A1c MFr Bld: 7.2 % — ABNORMAL HIGH (ref 4.6–6.5)

## 2022-08-12 MED ORDER — VALACYCLOVIR HCL 1 G PO TABS
1000.0000 mg | ORAL_TABLET | Freq: Three times a day (TID) | ORAL | 0 refills | Status: AC
Start: 2022-08-12 — End: 2022-08-19

## 2022-08-12 MED ORDER — PREDNISONE 5 MG PO TABS
15.0000 mg | ORAL_TABLET | Freq: Two times a day (BID) | ORAL | 0 refills | Status: AC
Start: 2022-08-12 — End: 2022-08-15

## 2022-08-12 MED ORDER — PREDNISONE 2.5 MG PO TABS
7.5000 mg | ORAL_TABLET | Freq: Two times a day (BID) | ORAL | 0 refills | Status: AC
Start: 2022-08-12 — End: 2022-08-15

## 2022-08-12 MED ORDER — PREDNISONE 10 MG PO TABS
30.0000 mg | ORAL_TABLET | Freq: Two times a day (BID) | ORAL | 0 refills | Status: AC
Start: 2022-08-12 — End: 2022-08-15

## 2022-08-12 MED ORDER — OXYCODONE HCL 5 MG PO TABS
5.0000 mg | ORAL_TABLET | ORAL | 0 refills | Status: DC | PRN
Start: 2022-08-12 — End: 2022-08-18

## 2022-08-12 NOTE — Patient Instructions (Signed)

## 2022-08-12 NOTE — Progress Notes (Signed)
Subjective:  Patient ID: Lee Mitchell, male    DOB: [birth year]  Age: 72 y.o. MRN: [id number]  CC: Cough, Diabetes, Rash, and Hypertension   HPI [name] presents for f/up -   He complains of a 3-day history of painful rash over his left supraclavicular region.  It itches, stings, and burns.  He also complains of a recent increase in cough that is nonproductive.  He denies chest pain, hemoptysis, fever, chills, or night sweats.  Outpatient Medications Prior to Visit  Medication Sig Dispense Refill   albuterol (VENTOLIN HFA) 108 (90 Base) MCG/ACT inhaler INHALE 2 PUFFS INTO THE LUNGS EVERY 6 HOURS AS NEEDED FOR WHEEZE 6.7 each 5   diclofenac Sodium (VOLTAREN) 1 % GEL Apply 2 g topically 4 (four) times daily. Rub into affected area of foot 2 to 4 times daily 100 g 2   ezetimibe (ZETIA) 10 MG tablet TAKE 1 TABLET BY MOUTH EVERY DAY 90 tablet 1   Fluticasone-Umeclidin-Vilant (TRELEGY ELLIPTA) 100-62.5-25 MCG/ACT AEPB TAKE 1 PUFF BY MOUTH EVERY DAY 60 each 1   levothyroxine (SYNTHROID) 150 MCG tablet TAKE 1 TABLET BY MOUTH EVERY DAY BEFORE BREAKFAST 90 tablet 1   mirtazapine (REMERON) 30 MG tablet TAKE 1 TABLET BY MOUTH EVERY DAY IN THE EVENING 90 tablet 1   omeprazole (PRILOSEC) 40 MG capsule TAKE 1 CAPSULE (40 MG TOTAL) BY MOUTH DAILY. NEEDS APPT FOR ADDITIONAL REFILLS 90 capsule 0   rosuvastatin (CRESTOR) 40 MG tablet Take 1 tablet (40 mg total) by mouth at bedtime. 90 tablet 0   tamsulosin (FLOMAX) 0.4 MG CAPS capsule Take 0.4 mg by mouth daily.     tiZANidine (ZANAFLEX) 2 MG tablet Take 1 tablet (2 mg total) by mouth every 8 (eight) hours as needed for muscle spasms. 270 tablet 0   Dapagliflozin-metFORMIN HCl ER (XIGDUO XR) 10-500 MG TB24 Take 1 tablet by mouth daily. 90 tablet 1   HYDROcodone-acetaminophen (NORCO/VICODIN) 5-325 MG tablet Take 1 tablet by mouth every 8 (eight) hours as needed for moderate pain. 65 tablet 0   No facility-administered medications prior to  visit.    ROS Review of Systems  Constitutional: Negative.  Negative for chills, diaphoresis, fatigue and fever.  HENT: Negative.  Negative for sore throat and trouble swallowing.   Respiratory:  Positive for cough. Negative for choking, chest tightness, shortness of breath and wheezing.   Cardiovascular:  Negative for chest pain, palpitations and leg swelling.  Gastrointestinal:  Negative for abdominal pain.  Genitourinary: Negative.  Negative for dysuria.  Musculoskeletal:  Positive for arthralgias and neck pain. Negative for myalgias.  Neurological: Negative.  Negative for dizziness and weakness.  Hematological:  Negative for adenopathy. Does not bruise/bleed easily.  Psychiatric/Behavioral: Negative.      Objective:  BP 110/68 (BP Location: Right Arm, Patient Position: Sitting, Cuff Size: Normal)   Pulse 90   Temp 98.6 F (37 C) (Oral)   Ht 6\' 2"  (1.88 m)   Wt 167 lb (75.8 kg)   SpO2 98%   BMI 21.44 kg/m   BP Readings from Last 3 Encounters:  08/12/22 110/68  04/14/22 132/74  03/17/22 132/76    Wt Readings from Last 3 Encounters:  08/12/22 167 lb (75.8 kg)  04/14/22 170 lb (77.1 kg)  03/17/22 170 lb (77.1 kg)    Physical Exam Vitals reviewed.  Constitutional:      General: He is not in acute distress.    Appearance: He is not ill-appearing, toxic-appearing or diaphoretic.  HENT:     Nose: Nose normal.     Mouth/Throat:     Mouth: Mucous membranes are moist.  Eyes:     General: No scleral icterus.    Conjunctiva/sclera: Conjunctivae normal.  Cardiovascular:     Rate and Rhythm: Normal rate and regular rhythm.     Heart sounds: No murmur heard.    No friction rub. No gallop.  Pulmonary:     Effort: Pulmonary effort is normal.     Breath sounds: No stridor. No wheezing, rhonchi or rales.  Abdominal:     General: Abdomen is flat.     Palpations: There is no mass.     Tenderness: There is no abdominal tenderness. There is no guarding.     Hernia: No  hernia is present.  Musculoskeletal:        General: Normal range of motion.     Cervical back: Neck supple.     Right lower leg: No edema.     Left lower leg: No edema.  Lymphadenopathy:     Cervical: No cervical adenopathy.  Skin:    General: Skin is warm.     Findings: Erythema and rash present. Rash is macular. Rash is not nodular, purpuric, pustular or vesicular.     Comments: There are a group of erythematous macules clustered in the left C4 dermatome.  There is no associated lymphadenopathy.  Neurological:     General: No focal deficit present.     Mental Status: He is alert. Mental status is at baseline.  Psychiatric:        Mood and Affect: Mood normal.        Behavior: Behavior normal.     Lab Results  Component Value Date   WBC 12.3 (H) 08/12/2022   HGB 14.4 08/12/2022   HCT 43.3 08/12/2022   PLT 406.0 (H) 08/12/2022   GLUCOSE 110 (H) 08/12/2022   CHOL 107 11/23/2021   TRIG 76.0 11/23/2021   HDL 40.60 11/23/2021   LDLCALC 52 11/23/2021   ALT 36 12/29/2021   AST 32 12/29/2021   NA 140 08/12/2022   K 4.8 08/12/2022   CL 103 08/12/2022   CREATININE 1.07 08/12/2022   BUN 23 08/12/2022   CO2 27 08/12/2022   TSH 0.65 04/15/2022   PSA 7.63 (H) 08/12/2022   INR 0.9 06/24/2019   HGBA1C 7.2 (H) 08/12/2022   MICROALBUR 2.4 (H) 11/23/2021    MR PROSTATE W WO CONTRAST  Result Date: 04/13/2022 CLINICAL DATA:  Elevated PSA. EXAM: MR PROSTATE WITHOUT AND WITH CONTRAST TECHNIQUE: Multiplanar multisequence MRI images were obtained of the pelvis centered about the prostate. Pre and post contrast images were obtained. CONTRAST:  8 mL Vueway COMPARISON:  None Available. FINDINGS: Prostate: -- Peripheral Zone: No focal lesion seen on ADC or high b-value DWI sequences. -- Transition/Central Zone: Mildly enlarged with diffuse involvement by BPH nodules. No non-circumscribed or otherwise suspicious appearing nodules identified. -- Measurements/Volume:  5.1 by 3.7 x 4.9 cm (volume  = 48 cm^3) Transcapsular spread:  Absent Seminal vesicle involvement:  Absent Neurovascular bundle involvement:  Absent Pelvic adenopathy: None visualized Bone metastasis: None visualized. Artifact from lumbosacral spine fusion hardware noted. Other: Diffuse bladder wall thickening, consistent with chronic bladder outlet obstruction. Sigmoid diverticulosis, without evidence of diverticulitis. IMPRESSION: No radiographic evidence of high-grade prostate carcinoma. PI-RADS 1 (v2.1): Very Low (clinically significant cancer highly unlikely) Electronically Signed   By: Danae Orleans M.D.   On: 04/13/2022 15:18   DG Chest 2  View  Result Date: 08/12/2022 CLINICAL DATA:  Worsening cough, shortness of breath, and some chest pain for months EXAM: CHEST - 2 VIEW COMPARISON:  04/14/2022 FINDINGS: Normal heart size, mediastinal contours, and pulmonary vascularity. RIGHT nipple shadow unchanged since 11/23/2021. Lungs emphysematous but clear. No pleural effusion or pneumothorax. Osseous structures unremarkable. IMPRESSION: Emphysematous changes without acute abnormalities. Emphysema (ICD10-J43.9). Electronically Signed   By: Ulyses Southward M.D.   On: 08/12/2022 15:06     Assessment & Plan:   Acute cough- Chest x-ray is negative for mass or infiltrate. -     DG Chest 2 View; Future  Hyperlipidemia with target LDL less than 100  Hypertension, unspecified type- His blood pressure is well-controlled. -     Basic metabolic panel; Future -     CBC with Differential/Platelet; Future  PSA elevation -     PSA; Future  Type II diabetes mellitus with manifestations ([name]) -     Hemoglobin A1c; Future  Herpes zoster with other complication- Will control the pain with oxycodone.  Will reduce the associated symptoms with prednisone.  Will treat with a 7-day course of valacyclovir. -     valACYclovir HCl; Take 1 tablet (1,000 mg total) by mouth 3 (three) times daily for 7 days.  Dispense: 21 tablet; Refill: 0 -     oxyCODONE  HCl; Take 1 tablet (5 mg total) by mouth every 4 (four) hours as needed for up to 7 days for severe pain.  Dispense: 30 tablet; Refill: 0 -     predniSONE; Take 3 tablets (30 mg total) by mouth 2 (two) times daily with a meal for 3 days.  Dispense: 18 tablet; Refill: 0 -     predniSONE; Take 3 tablets (7.5 mg total) by mouth 2 (two) times daily with a meal for 3 days.  Dispense: 18 tablet; Refill: 0 -     predniSONE; Take 3 tablets (15 mg total) by mouth 2 (two) times daily with a meal for 3 days.  Dispense: 18 tablet; Refill: 0     Follow-up: Return in about 3 weeks (around 09/02/2022).  Sanda Linger, MD

## 2022-08-14 ENCOUNTER — Other Ambulatory Visit: Payer: Self-pay | Admitting: Internal Medicine

## 2022-08-14 DIAGNOSIS — B028 Zoster with other complications: Secondary | ICD-10-CM

## 2022-08-14 DIAGNOSIS — E1140 Type 2 diabetes mellitus with diabetic neuropathy, unspecified: Secondary | ICD-10-CM

## 2022-08-14 MED ORDER — CAPSAICIN 0.1 % EX CREA
1.0000 | TOPICAL_CREAM | Freq: Three times a day (TID) | CUTANEOUS | 2 refills | Status: DC | PRN
Start: 2022-08-14 — End: 2023-08-17

## 2022-08-15 ENCOUNTER — Other Ambulatory Visit: Payer: Self-pay | Admitting: Internal Medicine

## 2022-08-15 DIAGNOSIS — E785 Hyperlipidemia, unspecified: Secondary | ICD-10-CM

## 2022-08-16 ENCOUNTER — Telehealth: Payer: Self-pay

## 2022-08-16 NOTE — Progress Notes (Signed)
   08/16/2022  Patient ID: Lee Mitchell, male   DOB: 1951/03/09, 72 y.o.   MRN: [id number]  Outreach attempt to discuss medication problem routed to me by Dr. Yetta Barre unsuccessful.  Appears patient was not able to obtain Xigduo due to cost.  Left message with my direct number for patient to call me at his convenience.  Lenna Gilford, PharmD, DPLA

## 2022-08-18 ENCOUNTER — Telehealth: Payer: Self-pay | Admitting: Internal Medicine

## 2022-08-18 ENCOUNTER — Other Ambulatory Visit: Payer: Self-pay | Admitting: Internal Medicine

## 2022-08-18 ENCOUNTER — Telehealth: Payer: Self-pay

## 2022-08-18 DIAGNOSIS — B028 Zoster with other complications: Secondary | ICD-10-CM

## 2022-08-18 MED ORDER — OXYCODONE HCL 5 MG PO TABS
5.0000 mg | ORAL_TABLET | ORAL | 0 refills | Status: AC | PRN
Start: 2022-08-18 — End: 2022-08-25

## 2022-08-18 NOTE — Telephone Encounter (Signed)
PAP application for (XIGDUO XR  and AZ&ME)  has been mailed to pt home. I will fax PCP pages once I receive pt pages.  Georg[facility] [phone]   PLEASED BE ADVISED

## 2022-08-18 NOTE — Telephone Encounter (Signed)
Prescription Request  08/18/2022  LOV: 08/12/2022  What is the name of the medication or equipment? Oxycodone 5 mg.  Have you contacted your pharmacy to request a refill? No   Which pharmacy would you like this sent to?  [phone], [place] - [address] [place] [address] Phone: [phone] Fax: [phone]   Patient notified that their request is being sent to the clinical staff for review and that they should receive a response within 2 business days.   Please advise at Mobile [phone] (mobile)

## 2022-08-18 NOTE — Progress Notes (Signed)
   08/18/2022  Patient ID: Lee Mitchell, male   DOB: Nov 07, 1950, 72 y.o.   MRN: [id number]  Subjective/Objective: Telephone visit to discuss medication access after patient's PCP, Dr. Yetta Barre, routed request to me to assist with affordability of Xigduo.  Medication Access -Patient has been on Xigduo 10/500mg  daily, but the prescription is $50/monthly, which is not affordable every month -States he is/almost out of Xigduo and will take on hand metformin in place of once out (instructed to per Dr. Yetta Barre)  Assessment/Plan:   Medication Access -Based on household income, patient would qualify for PAP through AZ & Me.  Routing chart to medication assistance team to initiated application -Patient has my direct number if any questions/concerns arise around application process  Follow-up:  As needed per PCP  Lenna Gilford, PharmD, DPLA

## 2022-08-18 NOTE — Telephone Encounter (Signed)
-----   Message from Lenna Gilford, [facility] sent at 08/18/2022  1:39 PM EDT ----- Good afternoon!  Would you all mind to initiate AZ & Me PAP application for this patient for Xigduo XR, please?  Thank you!

## 2022-08-30 ENCOUNTER — Telehealth: Payer: Self-pay | Admitting: Internal Medicine

## 2022-08-30 NOTE — Telephone Encounter (Signed)
Patient was taken off of Hydrocodone and would like to know if it can be prescribed again. If so, he would like [id number] [place],  - [address] . Otherwise, please call back at [phone].  Last OV was 08/12/2022.

## 2022-09-01 ENCOUNTER — Other Ambulatory Visit: Payer: Self-pay | Admitting: Internal Medicine

## 2022-09-01 DIAGNOSIS — M5030 Other cervical disc degeneration, unspecified cervical region: Secondary | ICD-10-CM

## 2022-09-01 DIAGNOSIS — E1140 Type 2 diabetes mellitus with diabetic neuropathy, unspecified: Secondary | ICD-10-CM

## 2022-09-01 DIAGNOSIS — M159 Polyosteoarthritis, unspecified: Secondary | ICD-10-CM

## 2022-09-01 MED ORDER — HYDROCODONE-ACETAMINOPHEN 5-325 MG PO TABS
1.0000 | ORAL_TABLET | Freq: Four times a day (QID) | ORAL | 0 refills | Status: DC | PRN
Start: 2022-09-01 — End: 2022-10-07

## 2022-09-01 NOTE — Telephone Encounter (Signed)
Patient called to check on the status of his request. Best callback is [phone].

## 2022-09-15 ENCOUNTER — Encounter: Payer: Self-pay | Admitting: Internal Medicine

## 2022-09-20 ENCOUNTER — Ambulatory Visit (INDEPENDENT_AMBULATORY_CARE_PROVIDER_SITE_OTHER): Payer: Medicare Other | Admitting: Family Medicine

## 2022-09-20 ENCOUNTER — Other Ambulatory Visit: Payer: Self-pay | Admitting: Internal Medicine

## 2022-09-20 ENCOUNTER — Encounter: Payer: Self-pay | Admitting: Family Medicine

## 2022-09-20 VITALS — BP 116/64 | HR 88 | Temp 98.2°F | Resp 20 | Ht 74.0 in | Wt 169.0 lb

## 2022-09-20 DIAGNOSIS — M545 Low back pain, unspecified: Secondary | ICD-10-CM

## 2022-09-20 DIAGNOSIS — R21 Rash and other nonspecific skin eruption: Secondary | ICD-10-CM | POA: Diagnosis not present

## 2022-09-20 DIAGNOSIS — R195 Other fecal abnormalities: Secondary | ICD-10-CM | POA: Diagnosis not present

## 2022-09-20 DIAGNOSIS — K219 Gastro-esophageal reflux disease without esophagitis: Secondary | ICD-10-CM

## 2022-09-20 DIAGNOSIS — T402X5A Adverse effect of other opioids, initial encounter: Secondary | ICD-10-CM

## 2022-09-20 DIAGNOSIS — K5903 Drug induced constipation: Secondary | ICD-10-CM

## 2022-09-20 DIAGNOSIS — R972 Elevated prostate specific antigen [PSA]: Secondary | ICD-10-CM

## 2022-09-20 MED ORDER — SULFAMETHOXAZOLE-TRIMETHOPRIM 800-160 MG PO TABS
1.0000 | ORAL_TABLET | Freq: Two times a day (BID) | ORAL | 0 refills | Status: AC
Start: 2022-09-20 — End: 2022-09-27

## 2022-09-20 MED ORDER — LUBIPROSTONE 24 MCG PO CAPS
24.0000 ug | ORAL_CAPSULE | Freq: Two times a day (BID) | ORAL | 2 refills | Status: DC
Start: 2022-09-20 — End: 2023-09-06

## 2022-09-20 MED ORDER — BETAMETHASONE DIPROPIONATE 0.05 % EX CREA
TOPICAL_CREAM | Freq: Two times a day (BID) | CUTANEOUS | 0 refills | Status: DC
Start: 2022-09-20 — End: 2022-09-29

## 2022-09-20 NOTE — Progress Notes (Signed)
Assessment & Plan:  1. Rash I do not believe the rash is shingles as it is unchanged from a month and a half ago. - betamethasone dipropionate 0.05 % cream; Apply topically 2 (two) times daily.  Dispense: 30 g; Refill: 0 - sulfamethoxazole-trimethoprim (BACTRIM DS) 800-160 MG tablet; Take 1 tablet by mouth 2 (two) times daily for 7 days.  Dispense: 14 tablet; Refill: 0  2. Acute right-sided low back pain without sciatica Discussed this could be due to constipation and even possibly his prostate.  3. Constipation due to opioid therapy Uncontrolled.  Started Amitiza twice daily. - lubiprostone (AMITIZA) 24 MCG capsule; Take 1 capsule (24 mcg total) by mouth 2 (two) times daily with a meal.  Dispense: 60 capsule; Refill: 2  4. Positive colorectal cancer screening using Cologuard test Message sent to referral department regarding the referral to [facility] gastroenterology that was placed last year and not scheduled.  The referral has comments that it is waiting on records from [facility] and [place], however the patient does not think he had a colonoscopy completed in either of these locations.  He did have an EGD completed and both of these locations and these results are located in his chart in care everywhere.  5. PSA elevation Patient is agreeable to a prostate biopsy as previously recommended by his urologist.  Per the patient's request I have sent his urologist, Dr. Alvester Morin, a message letting him know.   Follow up plan: Return if symptoms worsen or fail to improve.  Deliah Boston, MSN, APRN, FNP-C  Subjective:  HPI: Lee Mitchell is a 72 y.o. male presenting on 09/20/2022 for Rash (Same area as recent shingles rash (4/25- OV for this, took meds) it got better and then got worse again. )  Patient was diagnosed with shingles on 08/12/2022 at which time he was treated with Valtrex, prednisone, and oxycodone. He reports the rash stopped itching and stinging, but now feels like it is  starting over.  The appearance of the rash never changed.  He also reports worsening constipation and back pain that starts on the right side and wraps around to his abdomen and down into his right testicle.  He is aware that taking pain medication makes his constipation worse and takes MiraLAX when he takes pain medication.  He has not taken any pain medication in 2 days because of the constipation.  He had his last bowel movement yesterday, and states that he responded out at that time.  He does have a urologist to whom he mentioned his right testicle pain.  He states they want to do a prostate biopsy due to his elevated PSA, but he previously did not agree to the procedure due to cost.  He does also have a history of a positive Cologuard last year.  He has not had a follow-up colonoscopy.  He does have a history of colon polyps.  A referral was placed to [facility] last November.  The referral comments say they are waiting on records from [facility] and [place].  Patient is unsure if he has had a colonoscopy in either place in the past.  He did have an EGD in [place] and [place], both of which are in his chart and care everywhere.    ROS: Negative unless specifically indicated above in HPI.   Relevant past medical history reviewed and updated as indicated.   Allergies and medications reviewed and updated.   Current Outpatient Medications:    albuterol (VENTOLIN HFA) 108 (90  Base) MCG/ACT inhaler, INHALE 2 PUFFS INTO THE LUNGS EVERY 6 HOURS AS NEEDED FOR WHEEZE, Disp: 6.7 each, Rfl: 5   Capsaicin 0.1 % CREA, Apply 1 Act topically 3 (three) times daily as needed., Disp: 60 g, Rfl: 2   diclofenac Sodium (VOLTAREN) 1 % GEL, Apply 2 g topically 4 (four) times daily. Rub into affected area of foot 2 to 4 times daily, Disp: 100 g, Rfl: 2   ezetimibe (ZETIA) 10 MG tablet, TAKE 1 TABLET BY MOUTH EVERY DAY, Disp: 90 tablet, Rfl: 1   Fluticasone-Umeclidin-Vilant (TRELEGY ELLIPTA) 100-62.5-25  MCG/ACT AEPB, TAKE 1 PUFF BY MOUTH EVERY DAY, Disp: 60 each, Rfl: 1   HYDROcodone-acetaminophen (NORCO/VICODIN) 5-325 MG tablet, Take 1 tablet by mouth every 6 (six) hours as needed for moderate pain., Disp: 90 tablet, Rfl: 0   levothyroxine (SYNTHROID) 150 MCG tablet, TAKE 1 TABLET BY MOUTH EVERY DAY BEFORE BREAKFAST, Disp: 90 tablet, Rfl: 1   mirtazapine (REMERON) 30 MG tablet, TAKE 1 TABLET BY MOUTH EVERY DAY IN THE EVENING, Disp: 90 tablet, Rfl: 1   omeprazole (PRILOSEC) 40 MG capsule, TAKE 1 CAPSULE (40 MG TOTAL) BY MOUTH DAILY. NEEDS APPT FOR ADDITIONAL REFILLS, Disp: 90 capsule, Rfl: 0   rosuvastatin (CRESTOR) 40 MG tablet, TAKE 1 TABLET BY MOUTH EVERYDAY AT BEDTIME, Disp: 90 tablet, Rfl: 0   tamsulosin (FLOMAX) 0.4 MG CAPS capsule, Take 0.4 mg by mouth daily., Disp: , Rfl:   No Known Allergies  Objective:   BP 116/64   Pulse 88   Temp 98.2 F (36.8 C)   Resp 20   Ht 6\' 2"  (1.88 m)   Wt 169 lb (76.7 kg)   BMI 21.70 kg/m    Physical Exam Vitals reviewed.  Constitutional:      General: He is not in acute distress.    Appearance: Normal appearance. He is not ill-appearing, toxic-appearing or diaphoretic.  HENT:     Head: Normocephalic and atraumatic.  Eyes:     General: No scleral icterus.       Right eye: No discharge.        Left eye: No discharge.     Conjunctiva/sclera: Conjunctivae normal.  Cardiovascular:     Rate and Rhythm: Normal rate.  Pulmonary:     Effort: Pulmonary effort is normal. No respiratory distress.  Abdominal:     General: Bowel sounds are normal. There is no distension or abdominal bruit.     Palpations: Abdomen is soft. There is no hepatomegaly, splenomegaly, mass or pulsatile mass.     Tenderness: There is abdominal tenderness in the right lower quadrant.  Musculoskeletal:        General: Normal range of motion.     Cervical back: Normal range of motion.     Lumbar back: Tenderness (right side) present. No bony tenderness.  Skin:    General:  Skin is warm and dry.     Findings: Rash (red raised left side of neck unchanged from image below taken in April 2024) present.  Neurological:     Mental Status: He is alert and oriented to person, place, and time. Mental status is at baseline.  Psychiatric:        Mood and Affect: Mood normal.        Behavior: Behavior normal.        Thought Content: Thought content normal.        Judgment: Judgment normal.

## 2022-09-20 NOTE — Patient Instructions (Signed)
Constipation if needed: Milk of Magnesia Dulcolax suppository

## 2022-09-23 ENCOUNTER — Encounter: Payer: Self-pay | Admitting: Family Medicine

## 2022-09-29 ENCOUNTER — Ambulatory Visit (AMBULATORY_SURGERY_CENTER): Payer: Medicare Other | Admitting: *Deleted

## 2022-09-29 ENCOUNTER — Encounter: Payer: Self-pay | Admitting: Gastroenterology

## 2022-09-29 VITALS — Ht 74.0 in | Wt 167.0 lb

## 2022-09-29 DIAGNOSIS — R195 Other fecal abnormalities: Secondary | ICD-10-CM

## 2022-09-29 MED ORDER — NA SULFATE-K SULFATE-MG SULF 17.5-3.13-1.6 GM/177ML PO SOLN
1.0000 | Freq: Once | ORAL | 0 refills | Status: AC
Start: 2022-09-29 — End: 2022-09-29

## 2022-09-29 NOTE — Progress Notes (Signed)
Pt's name and DOB verified at the beginning of the pre-visit.  Pt denies any difficulty with ambulating,sitting, laying down or rolling side to side Gave both [facility] main # and MD on call # prior to instructions.  No egg or soy allergy known to patient  No issues known to pt with past sedation with any surgeries or procedures Pt denies having issues being intubated Pt has no issues moving head neck or swallowing No FH of Malignant Hyperthermia Pt is not on diet pills Pt is not on home 02  Pt is not on blood thinners  Pt has frequent issues with constipation RN instructed pt to use Miralax per bottles instructions a week before prep days. Pt states they will Pt is not on dialysis Pt denise any abnormal heart rhythms  Pt denies any upcoming cardiac testing Pt encouraged to use to use Singlecare or Goodrx to reduce cost  Patient's chart reviewed by Lee Mitchell CNRA prior to pre-visit and patient appropriate for the [facility].  Pre-visit completed and red dot placed by patient's name on their procedure day (on provider's schedule).  . Visit by phone Pt states weight is 167 lb Instructed pt why it is important to and  to call if they have any changes in health or new medications. Directed them to the # given and on instructions.   Pt states they will.  Instructions reviewed with pt and pt states understanding. Instructed to review again prior to procedure. Pt states they will.  Instructions sent by mail with coupon and by my chart Instructed not to smoke Marijuana or take Narcotic day day before or day of procedure

## 2022-10-04 ENCOUNTER — Other Ambulatory Visit: Payer: Self-pay | Admitting: Internal Medicine

## 2022-10-05 ENCOUNTER — Telehealth: Payer: Self-pay | Admitting: Internal Medicine

## 2022-10-05 NOTE — Telephone Encounter (Signed)
Prescription Request  10/05/2022  LOV: 08/12/2022  What is the name of the medication or equipment? hydrocodone  Have you contacted your pharmacy to request a refill? Yes   Which pharmacy would you like this sent to?  [phone], [place] - [address] [place] [address] Phone: [phone] Fax: [phone]     Patient notified that their request is being sent to the clinical staff for review and that they should receive a response within 2 business days.   Please advise at Mobile [phone] (mobile)

## 2022-10-07 ENCOUNTER — Other Ambulatory Visit: Payer: Self-pay | Admitting: Internal Medicine

## 2022-10-07 ENCOUNTER — Encounter: Payer: Self-pay | Admitting: Certified Registered Nurse Anesthetist

## 2022-10-07 DIAGNOSIS — E1140 Type 2 diabetes mellitus with diabetic neuropathy, unspecified: Secondary | ICD-10-CM

## 2022-10-07 DIAGNOSIS — M5030 Other cervical disc degeneration, unspecified cervical region: Secondary | ICD-10-CM

## 2022-10-07 DIAGNOSIS — M159 Polyosteoarthritis, unspecified: Secondary | ICD-10-CM

## 2022-10-07 MED ORDER — HYDROCODONE-ACETAMINOPHEN 5-325 MG PO TABS
1.0000 | ORAL_TABLET | Freq: Four times a day (QID) | ORAL | 0 refills | Status: DC | PRN
Start: 2022-10-07 — End: 2022-11-10

## 2022-10-07 NOTE — Telephone Encounter (Signed)
Patient requesting update of this request. [phone]

## 2022-10-12 ENCOUNTER — Ambulatory Visit (AMBULATORY_SURGERY_CENTER): Payer: Medicare Other | Admitting: Gastroenterology

## 2022-10-12 ENCOUNTER — Encounter: Payer: Self-pay | Admitting: Gastroenterology

## 2022-10-12 VITALS — BP 137/81 | HR 75 | Temp 97.5°F | Resp 15 | Ht 74.0 in | Wt 167.0 lb

## 2022-10-12 DIAGNOSIS — Z1211 Encounter for screening for malignant neoplasm of colon: Secondary | ICD-10-CM

## 2022-10-12 DIAGNOSIS — D125 Benign neoplasm of sigmoid colon: Secondary | ICD-10-CM

## 2022-10-12 DIAGNOSIS — D120 Benign neoplasm of cecum: Secondary | ICD-10-CM | POA: Diagnosis not present

## 2022-10-12 DIAGNOSIS — K635 Polyp of colon: Secondary | ICD-10-CM | POA: Diagnosis not present

## 2022-10-12 DIAGNOSIS — E119 Type 2 diabetes mellitus without complications: Secondary | ICD-10-CM | POA: Diagnosis not present

## 2022-10-12 DIAGNOSIS — R195 Other fecal abnormalities: Secondary | ICD-10-CM

## 2022-10-12 MED ORDER — SODIUM CHLORIDE 0.9 % IV SOLN
500.0000 mL | Freq: Once | INTRAVENOUS | Status: DC
Start: 2022-10-12 — End: 2022-10-12

## 2022-10-12 NOTE — Progress Notes (Signed)
Called to room to assist during endoscopic procedure.  Patient ID and intended procedure confirmed with present staff. Received instructions for my participation in the procedure from the performing physician.  

## 2022-10-12 NOTE — Progress Notes (Signed)
Pt's states no medical or surgical changes since previsit or office visit. 

## 2022-10-12 NOTE — Patient Instructions (Signed)
Resume previous diet and medications. Awaiting pathology results. Repeat Colonoscopy date to be determined based on pathology results. Handouts provided on colon polyps, Diverticulosis and Hemorrhoids   YOU HAD AN ENDOSCOPIC PROCEDURE TODAY AT THE [facility]:   Refer to the procedure report that was given to you for any specific questions about what was found during the examination.  If the procedure report does not answer your questions, please call your gastroenterologist to clarify.  If you requested that your care partner not be given the details of your procedure findings, then the procedure report has been included in a sealed envelope for you to review at your convenience later.  YOU SHOULD EXPECT: Some feelings of bloating in the abdomen. Passage of more gas than usual.  Walking can help get rid of the air that was put into your GI tract during the procedure and reduce the bloating. If you had a lower endoscopy (such as a colonoscopy or flexible sigmoidoscopy) you may notice spotting of blood in your stool or on the toilet paper. If you underwent a bowel prep for your procedure, you may not have a normal bowel movement for a few days.  Please Note:  You might notice some irritation and congestion in your nose or some drainage.  This is from the oxygen used during your procedure.  There is no need for concern and it should clear up in a day or so.  SYMPTOMS TO REPORT IMMEDIATELY:  Following lower endoscopy (colonoscopy or flexible sigmoidoscopy):  Excessive amounts of blood in the stool  Significant tenderness or worsening of abdominal pains  Swelling of the abdomen that is new, acute  Fever of 100F or higher  For urgent or emergent issues, a gastroenterologist can be reached at any hour by calling [phone]. Do not use MyChart messaging for urgent concerns.    DIET:  We do recommend a small meal at first, but then you may proceed to your regular diet.  Drink plenty  of fluids but you should avoid alcoholic beverages for 24 hours.  ACTIVITY:  You should plan to take it easy for the rest of today and you should NOT DRIVE or use heavy machinery until tomorrow (because of the sedation medicines used during the test).    FOLLOW UP: Our staff will call the number listed on your records the next business day following your procedure.  We will call around 7:15- 8:00 am to check on you and address any questions or concerns that you may have regarding the information given to you following your procedure. If we do not reach you, we will leave a message.     If any biopsies were taken you will be contacted by phone or by letter within the next 1-3 weeks.  Please call us at [phone] if you have not heard about the biopsies in 3 weeks.    SIGNATURES/CONFIDENTIALITY: You and/or your care partner have signed paperwork which will be entered into your electronic medical record.  These signatures attest to the fact that that the information above on your After Visit Summary has been reviewed and is understood.  Full responsibility of the confidentiality of this discharge information lies with you and/or your care-partner.

## 2022-10-12 NOTE — Progress Notes (Signed)
See 09/20/2022 H&P, no changes 

## 2022-10-12 NOTE — Op Note (Signed)
[facility] Patient Name: Lee Mitchell Procedure Date: 10/12/2022 4:34 PM MRN: [id number] Endoscopist: Meryl Dare , MD, [id number] Age: 72 Referring MD:  Date of Birth: 1950-05-27 Gender: Male Account #: [id number] Procedure:                Colonoscopy Indications:              Positive Cologuard test Medicines:                Monitored Anesthesia Care Procedure:                Pre-Anesthesia Assessment:                           - Prior to the procedure, a History and Physical                            was performed, and patient medications and                            allergies were reviewed. The patient's tolerance of                            previous anesthesia was also reviewed. The risks                            and benefits of the procedure and the sedation                            options and risks were discussed with the patient.                            All questions were answered, and informed consent                            was obtained. Prior Anticoagulants: The patient has                            taken no anticoagulant or antiplatelet agents. ASA                            Grade Assessment: II - A patient with mild systemic                            disease. After reviewing the risks and benefits,                            the patient was deemed in satisfactory condition to                            undergo the procedure.                           After obtaining informed consent, the colonoscope  was passed under direct vision. Throughout the                            procedure, the patient's blood pressure, pulse, and                            oxygen saturations were monitored continuously. The                            CF [id number] #[id number] was introduced through the anus                            and advanced to the the cecum, identified by                            appendiceal orifice and ileocecal  valve. The                            ileocecal valve, appendiceal orifice, and rectum                            were photographed. The quality of the bowel                            preparation was good. The colonoscopy was performed                            without difficulty. The patient tolerated the                            procedure well. Technical problem with server so                            Provation not available during the procedure: no                            photos, times. Scope In: 2:33:54 PM Scope Out: 2:52:43 PM Scope Withdrawal Time: 0 hours 16 minutes 16 seconds  Total Procedure Duration: 0 hours 18 minutes 49 seconds  Findings:                 The perianal and digital rectal examinations were                            normal.                           An 8 mm polyp was found in the cecum. The polyp was                            semi-pedunculated. The polyp was removed with a                            cold snare. Resection and retrieval were complete.  Three sessile polyps were found in the sigmoid                            colon. The polyps were 5 to 7 mm in size. These                            polyps were removed with a cold snare. Resection                            and retrieval were complete.                           Multiple medium-mouthed and small-mouthed                            diverticula were found in the left colon. There was                            narrowing of the colon in association with the                            diverticular opening. There was evidence of                            diverticular spasm. Peri-diverticular erythema was                            seen. There was no evidence of diverticular                            bleeding.                           Internal hemorrhoids were found during                            retroflexion. The hemorrhoids were small and Grade                             I (internal hemorrhoids that do not prolapse).                           The exam was otherwise without abnormality on                            direct and retroflexion views. Complications:            No immediate complications. Estimated blood loss:                            None. Estimated Blood Loss:     Estimated blood loss: none. Impression:               - One 8 mm polyp in the cecum, removed with a cold  snare. Resected and retrieved.                           - Three 5 to 7 mm polyps in the sigmoid colon,                            removed with a cold snare. Resected and retrieved.                           - Moderate diverticulosis in the left colon.                           - Internal hemorrhoids.                           - The examination was otherwise normal on direct                            and retroflexion views. Recommendation:           - Repeat colonoscopy after studies are complete for                            surveillance based on pathology results with Dr.                            Rhea Belton.                           - Patient has a contact number available for                            emergencies. The signs and symptoms of potential                            delayed complications were discussed with the                            patient. Return to normal activities tomorrow.                            Written discharge instructions were provided to the                            patient.                           - High fiber diet.                           - Continue present medications.                           - Await pathology results. Meryl Dare, MD 10/12/2022 4:39:14 PM This report has been signed electronically.

## 2022-10-12 NOTE — Progress Notes (Signed)
Report given to PACU, vss 

## 2022-10-13 ENCOUNTER — Telehealth: Payer: Self-pay | Admitting: *Deleted

## 2022-10-13 NOTE — Telephone Encounter (Signed)
Follow up call attempt.  Lvm to call if any questions or concerns.

## 2022-10-19 NOTE — Telephone Encounter (Signed)
RECEIVED PT PAGES for XIGDUO XR  to AZ&ME AND FAXED TO PROVIDER Sanda Linger TO REVIEW, FILLOUT AND FAX BACK TO [phone]  Melanee Spry CPhT [facility] [phone]

## 2022-10-24 ENCOUNTER — Encounter: Payer: Self-pay | Admitting: Gastroenterology

## 2022-10-29 NOTE — Telephone Encounter (Signed)
The office faxed me back for XIGDUO XR AND SAID HE DOES NOT TAKE THIS...    PLEASE BE ADVISED!!

## 2022-11-08 ENCOUNTER — Other Ambulatory Visit: Payer: Self-pay | Admitting: Internal Medicine

## 2022-11-08 ENCOUNTER — Telehealth: Payer: Self-pay | Admitting: Internal Medicine

## 2022-11-08 DIAGNOSIS — E785 Hyperlipidemia, unspecified: Secondary | ICD-10-CM

## 2022-11-08 NOTE — Telephone Encounter (Signed)
Prescription Request  11/08/2022  LOV: 08/12/2022  What is the name of the medication or equipment?  HYDROcodone-acetaminophen (NORCO/VICODIN) 5-325 MG tablet    Have you contacted your pharmacy to request a refill? No   [id number],  - [address] [place] [address] Phone: [phone] Fax: [phone]  pharmacy would you like this sent to?     Patient notified that their request is being sent to the clinical staff for review and that they should receive a response within 2 business days.   Please advise at Mobile [phone] (mobile)

## 2022-11-08 NOTE — Telephone Encounter (Signed)
Prescription Request  11/08/2022  LOV: 08/12/2022  What is the name of the medication or equipment?  HYDROcodone-acetaminophen (NORCO/VICODIN) 5-325 MG tablet   Have you contacted your pharmacy to request a refill? No   Which pharmacy would you like this sent to?  [phone] [place] - [address] [place] [address] Phone: [phone] Fax: [phone]    Patient notified that their request is being sent to the clinical staff for review and that they should receive a response within 2 business days.   Please advise at Mobile [phone] (mobile)

## 2022-11-08 NOTE — Telephone Encounter (Signed)
Duplicate request first msg on MD desktop. Closing encounter.Marland KitchenRaechel Chute

## 2022-11-10 ENCOUNTER — Other Ambulatory Visit: Payer: Self-pay | Admitting: Internal Medicine

## 2022-11-10 DIAGNOSIS — M5030 Other cervical disc degeneration, unspecified cervical region: Secondary | ICD-10-CM

## 2022-11-10 DIAGNOSIS — E1140 Type 2 diabetes mellitus with diabetic neuropathy, unspecified: Secondary | ICD-10-CM

## 2022-11-10 DIAGNOSIS — M150 Primary generalized (osteo)arthritis: Secondary | ICD-10-CM

## 2022-11-10 DIAGNOSIS — M159 Polyosteoarthritis, unspecified: Secondary | ICD-10-CM

## 2022-11-10 MED ORDER — HYDROCODONE-ACETAMINOPHEN 5-325 MG PO TABS
1.0000 | ORAL_TABLET | Freq: Four times a day (QID) | ORAL | 0 refills | Status: DC | PRN
Start: 2022-11-10 — End: 2022-12-29

## 2022-11-10 NOTE — Telephone Encounter (Signed)
Patient called to check on the status of his refill. Best callback is [phone].

## 2022-12-02 ENCOUNTER — Encounter (INDEPENDENT_AMBULATORY_CARE_PROVIDER_SITE_OTHER): Payer: Self-pay

## 2022-12-03 ENCOUNTER — Telehealth: Payer: Self-pay

## 2022-12-03 NOTE — Progress Notes (Unsigned)
   12/03/2022  Patient ID: Lee Mitchell, male   DOB: 16-Feb-1951, 72 y.o.   MRN: [id number]  wanted to find out about his application for XIGDUO XR did the doctor every get back with you he want to go back on it. he is taking Metformin right now but he prefers the Xigduo can you please follow up with him  Dr. Yetta Barre wanted him to continue metformin until next A1c done  Patient calling to schedule f/u w/ Dr. Yetta Barre  Also recently prescribed Trelegy- has a sample- would qualify for Breztri PAP Amitiza- there is also a PAP program for this he may qualify for- states it is around $50/month Consulting Dr. Yetta Barre

## 2022-12-10 ENCOUNTER — Telehealth: Payer: Self-pay | Admitting: Internal Medicine

## 2022-12-10 NOTE — Telephone Encounter (Signed)
Prescription Request  12/10/2022  LOV: 08/12/2022  What is the name of the medication or equipment? Hycrocodone and metformin  Have you contacted your pharmacy to request a refill? Yes   Which pharmacy would you like this sent to?  [facility] [phone] [place], [place] - [address] [place] [address] Phone: [phone] Fax: [phone]   Patient notified that their request is being sent to the clinical staff for review and that they should receive a response within 2 business days.   Please advise at Mobile [phone] (mobile)

## 2022-12-10 NOTE — Telephone Encounter (Signed)
Pt must keep appt due to control substance been issued.Marland KitchenRaechel Mitchell

## 2022-12-13 ENCOUNTER — Ambulatory Visit (INDEPENDENT_AMBULATORY_CARE_PROVIDER_SITE_OTHER): Payer: Medicare Other | Admitting: Internal Medicine

## 2022-12-13 ENCOUNTER — Encounter: Payer: Self-pay | Admitting: Internal Medicine

## 2022-12-13 ENCOUNTER — Other Ambulatory Visit: Payer: Self-pay | Admitting: Internal Medicine

## 2022-12-13 VITALS — BP 110/78 | HR 88 | Temp 98.2°F | Ht 74.0 in | Wt 170.6 lb

## 2022-12-13 DIAGNOSIS — B029 Zoster without complications: Secondary | ICD-10-CM | POA: Diagnosis not present

## 2022-12-13 DIAGNOSIS — F17200 Nicotine dependence, unspecified, uncomplicated: Secondary | ICD-10-CM | POA: Diagnosis not present

## 2022-12-13 DIAGNOSIS — Z7984 Long term (current) use of oral hypoglycemic drugs: Secondary | ICD-10-CM | POA: Diagnosis not present

## 2022-12-13 DIAGNOSIS — I10 Essential (primary) hypertension: Secondary | ICD-10-CM | POA: Diagnosis not present

## 2022-12-13 DIAGNOSIS — E118 Type 2 diabetes mellitus with unspecified complications: Secondary | ICD-10-CM | POA: Diagnosis not present

## 2022-12-13 MED ORDER — VALACYCLOVIR HCL 1 G PO TABS: 1000.0000 mg | ORAL_TABLET | Freq: Three times a day (TID) | ORAL | 1 refills | Status: AC

## 2022-12-13 MED ORDER — OXYCODONE HCL 5 MG PO TABS: 5.0000 mg | ORAL_TABLET | Freq: Four times a day (QID) | ORAL | 0 refills | Status: DC | PRN

## 2022-12-13 MED ORDER — LIDOCAINE 5 % EX PTCH: 1.0000 | MEDICATED_PATCH | CUTANEOUS | 0 refills | Status: DC

## 2022-12-13 MED ORDER — METFORMIN HCL ER (MOD) 500 MG PO TB24: 500.0000 mg | ORAL_TABLET | Freq: Every day | ORAL | 1 refills | Status: DC

## 2022-12-13 NOTE — Assessment & Plan Note (Signed)
Pt counsled to quit, pt not ready °

## 2022-12-13 NOTE — Assessment & Plan Note (Signed)
BP Readings from Last 3 Encounters:  12/13/22 110/78  10/12/22 137/81  09/20/22 116/64   Stable, pt to cont diet and wt control, to consider low dose arb

## 2022-12-13 NOTE — Assessment & Plan Note (Signed)
Lab Results  Component Value Date   HGBA1C 7.2 (H) 08/12/2022   uncontrolled, pt to continue current medical treatment metformin ER 500 mg- 1 every day, delicnes other change for now, for DM diet

## 2022-12-13 NOTE — Assessment & Plan Note (Signed)
Large painful outbreak, for valtrx 1000 tid x 10 days, oxycodone 5 mg qid #30 only, no refill, topical lidioderm prn

## 2022-12-13 NOTE — Patient Instructions (Signed)
Please take all new medication as prescribed 

## 2022-12-13 NOTE — Progress Notes (Signed)
Patient ID: Lee Mitchell, male   DOB: [birth year], 72 y.o.   MRN: [id number]        Chief Complaint: follow up shingles outbreak, dm, htn. smoking       HPI:  Lee Mitchell is a 72 y.o. male here with c/o 2 days onset mod to severe shingles outbreak similar to one several months ago, this time also with severe pain and very sensitive to touch even with the shirt.  First episode at 72 yo.  No known underlying immune d/o except for hx of dm with neuropathy.   Pt denies polydipsia, polyuria, or new focal neuro s/s.   Pt denies other chest pain, increased sob or doe, wheezing, orthopnea, PND, increased LE swelling, palpitations, dizziness or syncope. Still smoking, not ready to quit.  Has hydrocodone for chronic pain, required oxycodone last episode       Wt Readings from Last 3 Encounters:  12/13/22 170 lb 9.6 oz (77.4 kg)  10/12/22 167 lb (75.8 kg)  09/29/22 167 lb (75.8 kg)   BP Readings from Last 3 Encounters:  12/13/22 110/78  10/12/22 137/81  09/20/22 116/64         Past Medical History:  Diagnosis Date   Anxiety    Chronic abdominal pain    Colon polyps    Depression    Diabetes mellitus without complication ([name])    GERD (gastroesophageal reflux disease)    Hyperlipidemia    Kidney stones    PNA (pneumonia)    Thyroid cancer ([name])    Past Surgical History:  Procedure Laterality Date   ANKLE ARTHROSCOPY WITH RECONSTRUCTION     BACK SURGERY     CHOLECYSTECTOMY     COLONOSCOPY     ELBOW ARTHROSCOPY WITH TENDON RECONSTRUCTION     SHOULDER ARTHROSCOPY WITH LABRAL REPAIR     THYROIDECTOMY     2004/2005   ULNAR NERVE TRANSPOSITION      reports that he has been smoking cigarettes. He has a 8.8 pack-year smoking history. He has been exposed to tobacco smoke. He has never used smokeless tobacco. He reports that he does not currently use alcohol. He reports current drug use. Frequency: 3.00 times per week. Drug: Marijuana. family history includes Cancer (age of onset:  81) in his mother; Colon polyps in his father; Hypertension in his father and mother; Hyperthyroidism in his mother; Kidney cancer in his father; Kidney disease in his father; Thyroid cancer in his maternal uncle. No Known Allergies Current Outpatient Medications on File Prior to Visit  Medication Sig Dispense Refill   albuterol (VENTOLIN HFA) 108 (90 Base) MCG/ACT inhaler INHALE 2 PUFFS INTO THE LUNGS EVERY 6 HOURS AS NEEDED FOR WHEEZE 6.7 each 5   ezetimibe (ZETIA) 10 MG tablet TAKE 1 TABLET BY MOUTH EVERY DAY 90 tablet 1   Fluticasone-Umeclidin-Vilant (TRELEGY ELLIPTA) 100-62.5-25 MCG/ACT AEPB TAKE 1 PUFF BY MOUTH EVERY DAY 60 each 1   HYDROcodone-acetaminophen (NORCO/VICODIN) 5-325 MG tablet Take 1 tablet by mouth every 6 (six) hours as needed for moderate pain. 90 tablet 0   levothyroxine (SYNTHROID) 150 MCG tablet TAKE 1 TABLET BY MOUTH EVERY DAY BEFORE BREAKFAST 90 tablet 1   lubiprostone (AMITIZA) 24 MCG capsule Take 1 capsule (24 mcg total) by mouth 2 (two) times daily with a meal. 60 capsule 2   mirtazapine (REMERON) 30 MG tablet TAKE 1 TABLET BY MOUTH EVERY DAY IN THE EVENING 90 tablet 1   omeprazole (PRILOSEC) 40 MG capsule TAKE 1 CAPSULE (40  MG TOTAL) BY MOUTH DAILY. NEEDS APPT FOR ADDITIONAL REFILLS 90 capsule 0   polyethylene glycol powder (GLYCOLAX/MIRALAX) 17 GM/SCOOP powder Take 1 Container by mouth once. Ass needed     rosuvastatin (CRESTOR) 40 MG tablet TAKE 1 TABLET BY MOUTH EVERYDAY AT BEDTIME 90 tablet 0   tamsulosin (FLOMAX) 0.4 MG CAPS capsule Take 0.4 mg by mouth daily.     Capsaicin 0.1 % CREA Apply 1 Act topically 3 (three) times daily as needed. (Patient not taking: Reported on 09/29/2022) 60 g 2   diclofenac Sodium (VOLTAREN) 1 % GEL Apply 2 g topically 4 (four) times daily. Rub into affected area of foot 2 to 4 times daily (Patient not taking: Reported on 09/29/2022) 100 g 2   No current facility-administered medications on file prior to visit.        ROS:  All others  reviewed and negative.  Objective        PE:  BP 110/78 (BP Location: Left Arm, Patient Position: Sitting, Cuff Size: Normal)   Pulse 88   Temp 98.2 F (36.8 C) (Oral)   Ht 6\' 2"  (1.88 m)   Wt 170 lb 9.6 oz (77.4 kg)   SpO2 97%   BMI 21.90 kg/m                 Constitutional: Pt appears in NAD               HENT: Head: NCAT.                Right Ear: External ear normal.                 Left Ear: External ear normal.                Eyes: . Pupils are equal, round, and reactive to light. Conjunctivae and EOM are normal               Nose: without d/c or deformity               Neck: Neck supple. Gross normal ROM               Cardiovascular: Normal rate and regular rhythm.                 Pulmonary/Chest: Effort normal and breath sounds without rales or wheezing.                Abd:  Soft, NT, ND, + BS, no organomegaly               Neurological: Pt is alert. At baseline orientation, motor grossly intact               Skin: Skin is warm. Large typical shingle rashes outbreak to left base of neck and left upper chest wall, no other new lesions, LE edema - none               Psychiatric: Pt behavior is normal without agitation   Micro: none  Cardiac tracings I have personally interpreted today:  none  Pertinent Radiological findings (summarize): none   Lab Results  Component Value Date   WBC 12.3 (H) 08/12/2022   HGB 14.4 08/12/2022   HCT 43.3 08/12/2022   PLT 406.0 (H) 08/12/2022   GLUCOSE 110 (H) 08/12/2022   CHOL 107 11/23/2021   TRIG 76.0 11/23/2021   HDL 40.60 11/23/2021   LDLCALC 52 11/23/2021   ALT 36 12/29/2021  AST 32 12/29/2021   NA 140 08/12/2022   K 4.8 08/12/2022   CL 103 08/12/2022   CREATININE 1.07 08/12/2022   BUN 23 08/12/2022   CO2 27 08/12/2022   TSH 0.65 04/15/2022   PSA 7.63 (H) 08/12/2022   INR 0.9 06/24/2019   HGBA1C 7.2 (H) 08/12/2022   MICROALBUR 2.4 (H) 11/23/2021   Assessment/Plan:  Lee Mitchell is a 72 y.o. White or Caucasian  [1] male with  has a past medical history of Anxiety, Chronic abdominal pain, Colon polyps, Depression, Diabetes mellitus without complication ([name]), GERD (gastroesophageal reflux disease), Hyperlipidemia, Kidney stones, PNA (pneumonia), and Thyroid cancer ([name]).  Shingles Large painful outbreak, for valtrx 1000 tid x 10 days, oxycodone 5 mg qid #30 only, no refill, topical lidioderm prn  Type II diabetes mellitus with manifestations ([name]) Lab Results  Component Value Date   HGBA1C 7.2 (H) 08/12/2022   uncontrolled, pt to continue current medical treatment metformin ER 500 mg- 1 every day, delicnes other change for now, for DM diet    Tobacco use disorder, moderate, dependence Pt counsled to quit, pt not ready  Hypertension BP Readings from Last 3 Encounters:  12/13/22 110/78  10/12/22 137/81  09/20/22 116/64   Stable, pt to cont diet and wt control, to consider low dose arb  Followup: Return if symptoms worsen or fail to improve.  Oliver Barre, MD 12/13/2022 12:47 PM  [facility]

## 2022-12-21 ENCOUNTER — Telehealth: Payer: Self-pay | Admitting: Internal Medicine

## 2022-12-21 MED ORDER — OXYCODONE HCL 5 MG PO TABS: 5.0000 mg | ORAL_TABLET | Freq: Four times a day (QID) | ORAL | 0 refills | Status: DC | PRN

## 2022-12-21 NOTE — Telephone Encounter (Signed)
Pt has been informed that Rx refill was sent.

## 2022-12-21 NOTE — Telephone Encounter (Signed)
Ok done erx 

## 2022-12-21 NOTE — Telephone Encounter (Signed)
Patient saw Dr. Jonny Ruiz on 12/13/22 for shingles and was prescribed oxyCODONE (ROXICODONE) 5 MG immediate release tablet. He said he is still miserable and in pain and would like to know if he can get a refill of the oxycodone. His PCP is out of the office until 12/22/22. [id number] - [place], [place] - [address] Best callback is [phone].

## 2022-12-23 ENCOUNTER — Encounter: Payer: Self-pay | Admitting: Internal Medicine

## 2022-12-24 NOTE — Telephone Encounter (Signed)
Spoke to pt.   He wanted to schedule an OV.  Offered him first available on  9/11 @ 9.20am or 9/12 @ 9.20 or 10.20am. He stated he would check his schedule and call back as he declined to be seen by another provider.

## 2022-12-24 NOTE — Telephone Encounter (Signed)
Patient called back and needs you to call him.

## 2022-12-29 ENCOUNTER — Encounter: Payer: Self-pay | Admitting: Internal Medicine

## 2022-12-29 ENCOUNTER — Telehealth: Payer: Self-pay | Admitting: Internal Medicine

## 2022-12-29 ENCOUNTER — Ambulatory Visit (INDEPENDENT_AMBULATORY_CARE_PROVIDER_SITE_OTHER): Payer: Medicare Other | Admitting: Internal Medicine

## 2022-12-29 ENCOUNTER — Other Ambulatory Visit: Payer: Self-pay | Admitting: Internal Medicine

## 2022-12-29 VITALS — BP 124/62 | HR 90 | Temp 98.0°F | Resp 16 | Ht 74.0 in | Wt 168.0 lb

## 2022-12-29 DIAGNOSIS — K219 Gastro-esophageal reflux disease without esophagitis: Secondary | ICD-10-CM | POA: Diagnosis not present

## 2022-12-29 DIAGNOSIS — M4726 Other spondylosis with radiculopathy, lumbar region: Secondary | ICD-10-CM

## 2022-12-29 DIAGNOSIS — R972 Elevated prostate specific antigen [PSA]: Secondary | ICD-10-CM

## 2022-12-29 DIAGNOSIS — E1140 Type 2 diabetes mellitus with diabetic neuropathy, unspecified: Secondary | ICD-10-CM

## 2022-12-29 DIAGNOSIS — I10 Essential (primary) hypertension: Secondary | ICD-10-CM | POA: Diagnosis not present

## 2022-12-29 DIAGNOSIS — L233 Allergic contact dermatitis due to drugs in contact with skin: Secondary | ICD-10-CM | POA: Diagnosis not present

## 2022-12-29 DIAGNOSIS — M159 Polyosteoarthritis, unspecified: Secondary | ICD-10-CM

## 2022-12-29 DIAGNOSIS — E785 Hyperlipidemia, unspecified: Secondary | ICD-10-CM | POA: Diagnosis not present

## 2022-12-29 DIAGNOSIS — Z79891 Long term (current) use of opiate analgesic: Secondary | ICD-10-CM

## 2022-12-29 DIAGNOSIS — F1210 Cannabis abuse, uncomplicated: Secondary | ICD-10-CM

## 2022-12-29 DIAGNOSIS — E118 Type 2 diabetes mellitus with unspecified complications: Secondary | ICD-10-CM | POA: Diagnosis not present

## 2022-12-29 DIAGNOSIS — M5030 Other cervical disc degeneration, unspecified cervical region: Secondary | ICD-10-CM

## 2022-12-29 DIAGNOSIS — Z23 Encounter for immunization: Secondary | ICD-10-CM

## 2022-12-29 DIAGNOSIS — M5136 Other intervertebral disc degeneration, lumbar region: Secondary | ICD-10-CM

## 2022-12-29 DIAGNOSIS — E890 Postprocedural hypothyroidism: Secondary | ICD-10-CM

## 2022-12-29 LAB — PSA: PSA: 4.66 ng/mL — ABNORMAL HIGH (ref 0.10–4.00)

## 2022-12-29 LAB — HEPATIC FUNCTION PANEL
ALT: 16 U/L (ref 0–53)
AST: 14 U/L (ref 0–37)
Albumin: 3.7 g/dL (ref 3.5–5.2)
Alkaline Phosphatase: 59 U/L (ref 39–117)
Bilirubin, Direct: 0.1 mg/dL (ref 0.0–0.3)
Total Bilirubin: 0.4 mg/dL (ref 0.2–1.2)
Total Protein: 5.6 g/dL — ABNORMAL LOW (ref 6.0–8.3)

## 2022-12-29 LAB — MICROALBUMIN / CREATININE URINE RATIO
Creatinine,U: 55.6 mg/dL
Microalb Creat Ratio: 1.3 mg/g (ref 0.0–30.0)
Microalb, Ur: <0.7 mg/dL (ref 0.0–1.9)

## 2022-12-29 LAB — BASIC METABOLIC PANEL
BUN: 17 mg/dL (ref 6–23)
CO2: 27 meq/L (ref 19–32)
Calcium: 8.5 mg/dL (ref 8.4–10.5)
Chloride: 107 meq/L (ref 96–112)
Creatinine, Ser: 1.04 mg/dL (ref 0.40–1.50)
GFR: 73.03 mL/min (ref >60.00)
Glucose, Bld: 110 mg/dL — ABNORMAL HIGH (ref 70–99)
Potassium: 4.2 meq/L (ref 3.5–5.1)
Sodium: 139 meq/L (ref 135–145)

## 2022-12-29 LAB — TSH: TSH: 0.41 u[IU]/mL (ref 0.35–5.50)

## 2022-12-29 LAB — HEMOGLOBIN A1C: Hgb A1c MFr Bld: 7.1 % — ABNORMAL HIGH (ref 4.6–6.5)

## 2022-12-29 LAB — CBC WITH DIFFERENTIAL/PLATELET
Basophils Absolute: 0.1 10*3/uL (ref 0.0–0.1)
Basophils Relative: 0.6 % (ref 0.0–3.0)
Eosinophils Absolute: 0.1 10*3/uL (ref 0.0–0.7)
Eosinophils Relative: 0.9 % (ref 0.0–5.0)
HCT: 39.1 % (ref 39.0–52.0)
Hemoglobin: 12.6 g/dL — ABNORMAL LOW (ref 13.0–17.0)
Lymphocytes Relative: 19.9 % (ref 12.0–46.0)
Lymphs Abs: 2.2 10*3/uL (ref 0.7–4.0)
MCHC: 32.1 g/dL (ref 30.0–36.0)
MCV: 86.6 fl (ref 78.0–100.0)
Monocytes Absolute: 0.8 10*3/uL (ref 0.1–1.0)
Monocytes Relative: 7.6 % (ref 3.0–12.0)
Neutro Abs: 7.8 10*3/uL — ABNORMAL HIGH (ref 1.4–7.7)
Neutrophils Relative %: 71 % (ref 43.0–77.0)
Platelets: 324 10*3/uL (ref 150.0–400.0)
RBC: 4.51 Mil/uL (ref 4.22–5.81)
RDW: 15.7 % — ABNORMAL HIGH (ref 11.5–15.5)
WBC: 10.9 10*3/uL — ABNORMAL HIGH (ref 4.0–10.5)

## 2022-12-29 LAB — LIPID PANEL
Cholesterol: 102 mg/dL (ref 0–200)
HDL: 38.7 mg/dL — ABNORMAL LOW (ref >39.00)
LDL Cholesterol: 33 mg/dL (ref 0–99)
NonHDL: 63.08
Total CHOL/HDL Ratio: 3
Triglycerides: 150 mg/dL — ABNORMAL HIGH (ref 0.0–149.0)
VLDL: 30 mg/dL (ref 0.0–40.0)

## 2022-12-29 MED ORDER — OXYCODONE HCL 5 MG PO TABS: 5.0000 mg | ORAL_TABLET | Freq: Four times a day (QID) | ORAL | 0 refills | Status: DC | PRN

## 2022-12-29 MED ORDER — OMEPRAZOLE 40 MG PO CPDR
40.0000 mg | DELAYED_RELEASE_CAPSULE | Freq: Every day | ORAL | 0 refills | Status: DC
Start: 2022-12-29 — End: 2023-04-11

## 2022-12-29 MED ORDER — FLUOCINONIDE EMULSIFIED BASE 0.05 % EX CREA
1.0000 | TOPICAL_CREAM | Freq: Two times a day (BID) | CUTANEOUS | 1 refills | Status: DC
Start: 2022-12-29 — End: 2023-08-17

## 2022-12-29 MED ORDER — SHINGRIX 50 MCG/0.5ML IM SUSR
0.5000 mL | Freq: Once | INTRAMUSCULAR | 0 refills | Status: AC
Start: 2022-12-29 — End: 2022-12-29

## 2022-12-29 NOTE — Telephone Encounter (Signed)
Pt is requesting a refill of his oxyCODONE (ROXICODONE) 5 MG immediate release tablet RX.   Told pt we sent a refill on 9.3.24, and he claims he has already used it up due to px from his shingles.   Please send RX to pharmacy if available:  [id number]   Phone: [phone]  Fax: [phone]

## 2022-12-29 NOTE — Progress Notes (Signed)
Subjective:  Patient ID: Lee Mitchell, male    DOB: 1950/12/07  Age: 72 y.o. MRN: [id number]  CC: Rash, Hyperlipidemia, Hypothyroidism, Diabetes, and COPD   HPI Lee Mitchell presents for f/up -----  Discussed the use of AI scribe software for clinical note transcription with the patient, who gave verbal consent to proceed.  History of Present Illness   The patient, with a history of possible shingles and respiratory issues, presents with a recurrence of an itchy/painful rash and worsening shortness of breath. The patient has been managing the pain with oxycodone, which they report as effective. He has been applying several topical agents to the area with no improvement.  The patient also reports increasing shortness of breath, particularly when walking uphill and after showering. They describe a productive cough with white, sometimes tan, foamy sputum. Despite a reduction in smoking to two cigarettes a day and the use of inhalers, the shortness of breath persists. The patient denies any chest pain but does report some dizziness and lightheadedness associated with the breathlessness.  The patient also mentions a prior cardiac workup, including a cardiac catheterization, which reportedly showed no evidence of heart disease. They also mention low back pain.       Outpatient Medications Prior to Visit  Medication Sig Dispense Refill   albuterol (VENTOLIN HFA) 108 (90 Base) MCG/ACT inhaler INHALE 2 PUFFS INTO THE LUNGS EVERY 6 HOURS AS NEEDED FOR WHEEZE 6.7 each 5   Capsaicin 0.1 % CREA Apply 1 Act topically 3 (three) times daily as needed. 60 g 2   diclofenac Sodium (VOLTAREN) 1 % GEL Apply 2 g topically 4 (four) times daily. Rub into affected area of foot 2 to 4 times daily 100 g 2   ezetimibe (ZETIA) 10 MG tablet TAKE 1 TABLET BY MOUTH EVERY DAY 90 tablet 1   Fluticasone-Umeclidin-Vilant (TRELEGY ELLIPTA) 100-62.5-25 MCG/ACT AEPB TAKE 1 PUFF BY MOUTH EVERY DAY 60  each 1   levothyroxine (SYNTHROID) 150 MCG tablet TAKE 1 TABLET BY MOUTH EVERY DAY BEFORE BREAKFAST 90 tablet 1   lubiprostone (AMITIZA) 24 MCG capsule Take 1 capsule (24 mcg total) by mouth 2 (two) times daily with a meal. 60 capsule 2   metFORMIN (GLUCOPHAGE-XR) 500 MG 24 hr tablet Take 1 tablet (500 mg total) by mouth daily with breakfast. 90 tablet 1   mirtazapine (REMERON) 30 MG tablet TAKE 1 TABLET BY MOUTH EVERY DAY IN THE EVENING 90 tablet 1   polyethylene glycol powder (GLYCOLAX/MIRALAX) 17 GM/SCOOP powder Take 1 Container by mouth once. Ass needed     rosuvastatin (CRESTOR) 40 MG tablet TAKE 1 TABLET BY MOUTH EVERYDAY AT BEDTIME 90 tablet 0   tamsulosin (FLOMAX) 0.4 MG CAPS capsule Take 0.4 mg by mouth daily.     HYDROcodone-acetaminophen (NORCO/VICODIN) 5-325 MG tablet Take 1 tablet by mouth every 6 (six) hours as needed for moderate pain. 90 tablet 0   lidocaine (LIDODERM) 5 % Place 1 patch onto the skin daily. Remove & Discard patch within 12 hours or as directed by MD 30 patch 0   omeprazole (PRILOSEC) 40 MG capsule TAKE 1 CAPSULE (40 MG TOTAL) BY MOUTH DAILY. NEEDS APPT FOR ADDITIONAL REFILLS 90 capsule 0   oxyCODONE (ROXICODONE) 5 MG immediate release tablet Take 1 tablet (5 mg total) by mouth every 6 (six) hours as needed for severe pain. 30 tablet 0   No facility-administered medications prior to visit.    ROS Review  of Systems  Constitutional: Negative.  Negative for diaphoresis and fatigue.  HENT: Negative.  Negative for sore throat.   Eyes:  Negative for visual disturbance.  Respiratory:  Positive for cough and shortness of breath. Negative for chest tightness and wheezing.   Cardiovascular:  Negative for chest pain, palpitations and leg swelling.  Gastrointestinal:  Negative for abdominal pain, diarrhea and nausea.  Genitourinary: Negative.   Musculoskeletal:  Positive for back pain and neck pain. Negative for arthralgias and myalgias.       Burning/stinging pain in  his feet  Skin:  Positive for rash. Negative for color change.  Neurological: Negative.  Negative for dizziness and weakness.  Hematological:  Negative for adenopathy. Does not bruise/bleed easily.  Psychiatric/Behavioral: Negative.      Objective:  BP 124/62 (BP Location: Left Arm, Patient Position: Sitting, Cuff Size: Large)   Pulse 90   Temp 98 F (36.7 C) (Oral)   Resp 16   Ht 6\' 2"  (1.88 m)   Wt 168 lb (76.2 kg)   SpO2 94%   BMI 21.57 kg/m   BP Readings from Last 3 Encounters:  12/29/22 124/62  12/13/22 110/78  10/12/22 137/81    Wt Readings from Last 3 Encounters:  12/29/22 168 lb (76.2 kg)  12/13/22 170 lb 9.6 oz (77.4 kg)  10/12/22 167 lb (75.8 kg)    Physical Exam Vitals reviewed.  Constitutional:      Appearance: He is not ill-appearing.  HENT:     Mouth/Throat:     Mouth: Mucous membranes are moist.  Eyes:     General: No scleral icterus.    Conjunctiva/sclera: Conjunctivae normal.  Cardiovascular:     Rate and Rhythm: Normal rate and regular rhythm.     Heart sounds: No murmur heard.    No gallop.     Comments: EKG- NSR, 92 bpm Incomplete RBBB No LVH Unchanged Pulmonary:     Effort: Pulmonary effort is normal.     Breath sounds: No stridor. No wheezing, rhonchi or rales.  Abdominal:     General: Abdomen is flat.     Palpations: There is no mass.     Tenderness: There is no abdominal tenderness. There is no guarding.     Hernia: No hernia is present.  Musculoskeletal:     Cervical back: Neck supple.     Right lower leg: No edema.     Left lower leg: No edema.  Lymphadenopathy:     Cervical: No cervical adenopathy.  Skin:    Findings: Rash present.  Neurological:     General: No focal deficit present.     Mental Status: He is alert. Mental status is at baseline.  Psychiatric:        Attention and Perception: Attention normal.        Mood and Affect: Mood normal.        Behavior: Behavior normal.        Thought Content: Thought content  normal.        Cognition and Memory: Cognition normal.        Judgment: Judgment normal.     Lab Results  Component Value Date   WBC 10.9 (H) 12/29/2022   HGB 12.6 (L) 12/29/2022   HCT 39.1 12/29/2022   PLT 324.0 12/29/2022   GLUCOSE 110 (H) 12/29/2022   CHOL 102 12/29/2022   TRIG 150.0 (H) 12/29/2022   HDL 38.70 (L) 12/29/2022   LDLCALC 33 12/29/2022   ALT 16 12/29/2022   AST  14 12/29/2022   NA 139 12/29/2022   K 4.2 12/29/2022   CL 107 12/29/2022   CREATININE 1.04 12/29/2022   BUN 17 12/29/2022   CO2 27 12/29/2022   TSH 0.41 12/29/2022   PSA 4.66 (H) 12/29/2022   INR 0.9 06/24/2019   HGBA1C 7.1 (H) 12/29/2022   MICROALBUR <0.7 12/29/2022    MR PROSTATE W WO CONTRAST  Result Date: 04/13/2022 CLINICAL DATA:  Elevated PSA. EXAM: MR PROSTATE WITHOUT AND WITH CONTRAST TECHNIQUE: Multiplanar multisequence MRI images were obtained of the pelvis centered about the prostate. Pre and post contrast images were obtained. CONTRAST:  8 mL Vueway COMPARISON:  None Available. FINDINGS: Prostate: -- Peripheral Zone: No focal lesion seen on ADC or high b-value DWI sequences. -- Transition/Central Zone: Mildly enlarged with diffuse involvement by BPH nodules. No non-circumscribed or otherwise suspicious appearing nodules identified. -- Measurements/Volume:  5.1 by 3.7 x 4.9 cm (volume = 48 cm^3) Transcapsular spread:  Absent Seminal vesicle involvement:  Absent Neurovascular bundle involvement:  Absent Pelvic adenopathy: None visualized Bone metastasis: None visualized. Artifact from lumbosacral spine fusion hardware noted. Other: Diffuse bladder wall thickening, consistent with chronic bladder outlet obstruction. Sigmoid diverticulosis, without evidence of diverticulitis. IMPRESSION: No radiographic evidence of high-grade prostate carcinoma. PI-RADS 1 (v2.1): Very Low (clinically significant cancer highly unlikely) Electronically Signed   By: Danae Orleans M.D.   On: 04/13/2022 15:18     Assessment & Plan:  Cannabis abuse -     Urinalysis, Routine w reflex microscopic; Future  Hypertension, unspecified type- His EKG is negative for LVH.  Blood pressure is adequately well-controlled. -     Urinalysis, Routine w reflex microscopic; Future -     Urine drugs of abuse scrn w alc, routine (Ref Lab); Future -     EKG 12-Lead -     Basic metabolic panel; Future -     TSH; Future  Encounter for long-term opiate analgesic use- UDS is not consistent with prescribed meds.  Will discontinue the oxycodone. -     Oxycodone/Oxymorphone, Urine; Future -     DRUG MONITOR, OPIATES,W/CONF, URINE; Future -     Urine drugs of abuse scrn w alc, routine (Ref Lab); Future  Type II diabetes mellitus with manifestations ([name])- His blood sugar is well-controlled. -     Microalbumin / creatinine urine ratio; Future -     Basic metabolic panel; Future -     Hemoglobin A1c; Future -     HM Diabetes Foot Exam -     Ambulatory referral to Ophthalmology  Hyperlipidemia with target LDL less than 100 - LDL goal achieved. Doing well on the statin  -     Lipid panel; Future -     Hepatic function panel; Future -     TSH; Future  Gastroesophageal reflux disease without esophagitis -     Omeprazole; Take 1 capsule (40 mg total) by mouth daily.  Dispense: 90 capsule; Refill: 0 -     CBC with Differential/Platelet; Future  Flu vaccine need -     Flu Vaccine Trivalent High Dose (Fluad)  Allergic contact dermatitis due to drugs in contact with skin -     Fluocinonide Emulsified Base; Apply 1 Application topically 2 (two) times daily.  Dispense: 60 g; Refill: 1  PSA elevation- His PSA is down to 4.6. -     PSA; Future  Postoperative hypothyroidism- He is euthyroid. -     TSH; Future  Need for prophylactic vaccination and inoculation  against varicella -     Shingrix; Inject 0.5 mLs into the muscle once for 1 dose.  Dispense: 0.5 mL; Refill: 0  Primary osteoarthritis involving multiple  joints  Diabetic neuropathy, painful ([name])  DDD (degenerative disc disease), cervical  Other spondylosis with radiculopathy, lumbar region  Degeneration of lumbar intervertebral disc  Other orders -     DM TEMPLATE -     Panel [id number]     Follow-up: Return in about 4 months (around 04/30/2023).  Sanda Linger, MD

## 2022-12-29 NOTE — Patient Instructions (Signed)

## 2022-12-30 LAB — DRUG MONITOR, OPIATES,W/CONF, URINE: Opiates: NEGATIVE ng/mL (ref <100)

## 2022-12-30 LAB — DM TEMPLATE

## 2023-01-02 LAB — URINE DRUGS OF ABUSE SCREEN W ALC, ROUTINE (REF LAB)
Amphetamines, Urine: NEGATIVE ng/mL
Barbiturate Quant, Ur: NEGATIVE ng/mL
Benzodiazepine Quant, Ur: NEGATIVE ng/mL
Cocaine (Metab.): NEGATIVE ng/mL
Ethanol, Urine: NEGATIVE %
Methadone Screen, Urine: NEGATIVE ng/mL
Opiate Quant, Ur: NEGATIVE ng/mL
PCP Quant, Ur: NEGATIVE ng/mL
Propoxyphene: NEGATIVE ng/mL

## 2023-01-02 LAB — OXYCODONE/OXYMORPHONE, URINE: Oxycodone+Oxymorphone Ur Ql Scn: NEGATIVE ng/mL

## 2023-01-02 LAB — PANEL 799049
CARBOXY THC GC/MS CONF: >750 ng/mL
Cannabinoid GC/MS, Ur: POSITIVE — AB

## 2023-01-03 ENCOUNTER — Other Ambulatory Visit: Payer: Self-pay | Admitting: Internal Medicine

## 2023-01-10 ENCOUNTER — Ambulatory Visit (INDEPENDENT_AMBULATORY_CARE_PROVIDER_SITE_OTHER): Payer: Medicare Other

## 2023-01-10 VITALS — Ht 74.0 in | Wt 168.0 lb

## 2023-01-10 DIAGNOSIS — Z0000 Encounter for general adult medical examination without abnormal findings: Secondary | ICD-10-CM

## 2023-01-10 NOTE — Progress Notes (Signed)
Subjective:   Lee Mitchell is a 72 y.o. male who presents for Medicare Annual/Subsequent preventive examination.  Visit Complete: Virtual  I connected with  Tyrone Apple on 01/10/23 by a audio enabled telemedicine application and verified that I am speaking with the correct person using two identifiers.  Patient Location: Home  Provider Location: Home Office  I discussed the limitations of evaluation and management by telemedicine. The patient expressed understanding and agreed to proceed.  Patient Medicare AWV questionnaire was completed by the patient on 01/09/2023; I have confirmed that all information answered by patient is correct and no changes since this date.  Vital Signs: Because this visit was a virtual/telehealth visit, some criteria may be missing or patient reported. Any vitals not documented were not able to be obtained and vitals that have been documented are patient reported.    Cardiac Risk Factors include: advanced age (>11men, >16 women);hypertension;diabetes mellitus;dyslipidemia;male gender;Other (see comment), Risk factor comments: COPD, CAD,     Objective:    Today's Vitals   01/09/23 1525 01/10/23 0922  Weight:  168 lb (76.2 kg)  Height:  6\' 2"  (1.88 m)  PainSc: 7     Body mass index is 21.57 kg/m.     01/10/2023    9:44 AM 01/08/2022   11:00 AM 04/15/2021   12:32 PM 12/19/2020   10:08 AM 09/14/2019    6:27 AM 06/24/2019    3:23 PM 06/16/2019    6:12 PM  Advanced Directives  Does Patient Have a Medical Advance Directive? Yes No No No No No No  Type of Estate agent of [place];Living will        Copy of Healthcare Power of Attorney in Chart? No - copy requested        Would patient like information on creating a medical advance directive?  No - Patient declined   Yes (ED - Information included in AVS) No - Patient declined     Current Medications (verified) Outpatient Encounter Medications as of 01/10/2023  Medication  Sig   albuterol (VENTOLIN HFA) 108 (90 Base) MCG/ACT inhaler INHALE 2 PUFFS INTO THE LUNGS EVERY 6 HOURS AS NEEDED FOR WHEEZE   Capsaicin 0.1 % CREA Apply 1 Act topically 3 (three) times daily as needed.   diclofenac Sodium (VOLTAREN) 1 % GEL Apply 2 g topically 4 (four) times daily. Rub into affected area of foot 2 to 4 times daily   ezetimibe (ZETIA) 10 MG tablet TAKE 1 TABLET BY MOUTH EVERY DAY   fluocinonide-emollient (LIDEX-E) 0.05 % cream Apply 1 Application topically 2 (two) times daily.   Fluticasone-Umeclidin-Vilant (TRELEGY ELLIPTA) 100-62.5-25 MCG/ACT AEPB TAKE 1 PUFF BY MOUTH EVERY DAY   levothyroxine (SYNTHROID) 150 MCG tablet TAKE 1 TABLET BY MOUTH EVERY DAY BEFORE BREAKFAST   lubiprostone (AMITIZA) 24 MCG capsule Take 1 capsule (24 mcg total) by mouth 2 (two) times daily with a meal.   metFORMIN (GLUCOPHAGE-XR) 500 MG 24 hr tablet Take 1 tablet (500 mg total) by mouth daily with breakfast.   mirtazapine (REMERON) 30 MG tablet TAKE 1 TABLET BY MOUTH EVERY DAY IN THE EVENING   omeprazole (PRILOSEC) 40 MG capsule Take 1 capsule (40 mg total) by mouth daily.   polyethylene glycol powder (GLYCOLAX/MIRALAX) 17 GM/SCOOP powder Take 1 Container by mouth once. Ass needed   rosuvastatin (CRESTOR) 40 MG tablet TAKE 1 TABLET BY MOUTH EVERYDAY AT BEDTIME   tamsulosin (FLOMAX) 0.4 MG CAPS capsule Take 0.4 mg by mouth daily.  No facility-administered encounter medications on file as of 01/10/2023.    Allergies (verified) Patient has no known allergies.   History: Past Medical History:  Diagnosis Date   Anxiety    Chronic abdominal pain    Colon polyps    Depression    Diabetes mellitus without complication ([name])    GERD (gastroesophageal reflux disease)    Hyperlipidemia    Kidney stones    PNA (pneumonia)    Thyroid cancer ([name])    Past Surgical History:  Procedure Laterality Date   ANKLE ARTHROSCOPY WITH RECONSTRUCTION     BACK SURGERY     CHOLECYSTECTOMY     COLONOSCOPY      ELBOW ARTHROSCOPY WITH TENDON RECONSTRUCTION     SHOULDER ARTHROSCOPY WITH LABRAL REPAIR     THYROIDECTOMY     2004/2005   ULNAR NERVE TRANSPOSITION     Family History  Problem Relation Age of Onset   Hypertension Mother    Hyperthyroidism Mother    Cancer Mother 70       thyroid cancer   Kidney disease Father    Colon polyps Father    Kidney cancer Father    Hypertension Father    Thyroid cancer Maternal Uncle    Neuropathy Neg Hx    Colon cancer Neg Hx    Esophageal cancer Neg Hx    Stomach cancer Neg Hx    Rectal cancer Neg Hx    Social History   Socioeconomic History   Marital status: Single    Spouse name: Not on file   Number of children: 2   Years of education: Not on file   Highest education level: Not on file  Occupational History   Occupation: event planner   Tobacco Use   Smoking status: Every Day    Current packs/day: 0.25    Average packs/day: 0.3 packs/day for 35.0 years (8.8 ttl pk-yrs)    Types: Cigarettes    Passive exposure: Current (down to 2 a day)   Smokeless tobacco: Never  Vaping Use   Vaping status: Never Used  Substance and Sexual Activity   Alcohol use: Not Currently    Comment: occ   Drug use: Yes    Frequency: 3.0 times per week    Types: Marijuana    Comment: week ago   Sexual activity: Not Currently    Partners: Female  Other Topics Concern   Not on file  Social History Narrative   Lives alone   Social Determinants of Health   Financial Resource Strain: Medium Risk (01/09/2023)   Overall Financial Resource Strain (CARDIA)    Difficulty of Paying Living Expenses: Somewhat hard  Food Insecurity: Food Insecurity Present (01/09/2023)   Hunger Vital Sign    Worried About Running Out of Food in the Last Year: Sometimes true    Ran Out of Food in the Last Year: Sometimes true  Transportation Needs: Unmet Transportation Needs (01/09/2023)   PRAPARE - Transportation    Lack of Transportation (Medical): Patient declined    Lack  of Transportation (Non-Medical): Yes  Physical Activity: Insufficiently Active (01/09/2023)   Exercise Vital Sign    Days of Exercise per Week: 4 days    Minutes of Exercise per Session: 30 min  Stress: Stress Concern Present (01/09/2023)   [employer] of Occupational Health - Occupational Stress Questionnaire    Feeling of Stress : To some extent  Social Connections: Unknown (01/09/2023)   Social Connection and Isolation Panel [NHANES]    Frequency  of Communication with Friends and Family: Three times a week    Frequency of Social Gatherings with Friends and Family: Twice a week    Attends Religious Services: Patient declined    Database administrator or Organizations: No    Attends Engineer, structural: Patient declined    Marital Status: Divorced    Tobacco Counseling Ready to quit: Not Answered Counseling given: Not Answered   Clinical Intake:  Pre-visit preparation completed: Yes  Pain : 0-10 Pain Score: 7  Pain Location:  (All over)     BMI - recorded: 21.57 Nutritional Status: BMI of 19-24  Normal Diabetes: Yes CBG done?: No Did pt. bring in CBG monitor from home?: No  How often do you need to have someone help you when you read instructions, pamphlets, or other written materials from your doctor or pharmacy?: 1 - Never  Interpreter Needed?: No  Information entered by :: Curry Dulski, RMA   Activities of Daily Living    01/09/2023    3:25 PM  In your present state of health, do you have any difficulty performing the following activities:  Hearing? 0  Vision? 1  Difficulty concentrating or making decisions? 1  Walking or climbing stairs? 0  Dressing or bathing? 0  Doing errands, shopping? 1  Preparing Food and eating ? N  Using the Toilet? N  In the past six months, have you accidently leaked urine? N  Do you have problems with loss of bowel control? N  Managing your Medications? N  Managing your Finances? N  Housekeeping or managing  your Housekeeping? N    Patient Care Team: Etta Grandchild, MD as PCP - General (Internal Medicine)  Indicate any recent Medical Services you may have received from other than [facility] providers in the past year (date may be approximate).     Assessment:   This is a routine wellness examination for [facility].  Hearing/Vision screen Hearing Screening - Comments:: Denies hearing difficulties   Vision Screening - Comments:: Wears eyeglasses   Goals Addressed             This Visit's Progress    Exercise 3x per week (30 min per time)   On track     Depression Screen    01/10/2023    9:46 AM 12/13/2022    8:31 AM 09/20/2022    2:37 PM 08/12/2022    2:07 PM 04/14/2022    9:21 AM 01/08/2022   10:57 AM 11/23/2021   10:08 AM  PHQ 2/9 Scores  PHQ - 2 Score 4 4 1 3  0 0 5  PHQ- 9 Score 13 9  8  0 0 12    Fall Risk    01/09/2023    3:25 PM 12/13/2022    8:30 AM 08/12/2022    2:07 PM 04/14/2022    9:21 AM 01/08/2022   10:53 AM  Fall Risk   Falls in the past year? 0 0 1 0 0  Number falls in past yr: 0 0 1  0  Injury with Fall?  0 1 0 0  Risk for fall due to : No Fall Risks No Fall Risks History of fall(s) No Fall Risks No Fall Risks  Follow up Falls evaluation completed;Falls prevention discussed Falls evaluation completed Falls evaluation completed Falls evaluation completed Falls prevention discussed    MEDICARE RISK AT HOME: Medicare Risk at Home Any stairs in or around the home?: No Home free of loose throw rugs in walkways, pet  beds, electrical cords, etc?: Yes Adequate lighting in your home to reduce risk of falls?: No Life alert?: No Use of a cane, walker or w/c?: Yes Grab bars in the bathroom?: No Shower chair or bench in shower?: No Elevated toilet seat or a handicapped toilet?: No  TIMED UP AND GO:  Was the test performed?  No    Cognitive Function:        01/10/2023    9:44 AM 01/08/2022   11:02 AM  6CIT Screen  What Year? 0 points 0 points  What month? 0 points  0 points  What time? 0 points 0 points  Count back from 20 0 points 0 points  Months in reverse 0 points 0 points  Repeat phrase 0 points 0 points  Total Score 0 points 0 points    Immunizations Immunization History  Administered Date(s) Administered   Fluad Quad(high Dose 65+) 12/14/2018, 02/18/2020, 01/05/2022   Fluad Trivalent(High Dose 65+) 12/29/2022   Influenza, High Dose Seasonal PF 04/26/2018, 11/18/2018, 12/14/2018, 02/18/2020   Influenza,inj,Quad PF,6+ Mos 02/16/2017   Influenza-Unspecified 12/18/2016, 02/16/2017   Moderna SARS-COV2 Booster Vaccination 01/30/2021   Moderna Sars-Covid-2 Vaccination 06/21/2019, 07/19/2019, 02/18/2020   Pneumococcal Conjugate-13 01/19/2018   Pneumococcal Polysaccharide-23 02/26/2019, 01/05/2022   Tdap 04/19/2014, 07/06/2015   Zoster Recombinant(Shingrix) 01/14/2022    TDAP status: Up to date  Flu Vaccine status: Up to date  Pneumococcal vaccine status: Up to date  Covid-19 vaccine status: Information provided on how to obtain vaccines.   Qualifies for Shingles Vaccine? Yes   Zostavax completed Yes   Shingrix Completed?: No.    Education has been provided regarding the importance of this vaccine. Patient has been advised to call insurance company to determine out of pocket expense if they have not yet received this vaccine. Advised may also receive vaccine at local pharmacy or Health Dept. Verbalized acceptance and understanding.  Screening Tests Health Maintenance  Topic Date Due   OPHTHALMOLOGY EXAM  Never done   Zoster Vaccines- Shingrix (2 of 2) 03/11/2022   HEMOGLOBIN A1C  06/28/2023   Diabetic kidney evaluation - eGFR measurement  12/29/2023   Diabetic kidney evaluation - Urine ACR  12/29/2023   FOOT EXAM  12/29/2023   Medicare Annual Wellness (AWV)  01/10/2024   Fecal DNA (Cologuard)  12/08/2024   DTaP/Tdap/Td (3 - Td or Tdap) 07/05/2025   Pneumonia Vaccine 2+ Years old  Completed   INFLUENZA VACCINE  Completed    Hepatitis C Screening  Completed   HPV VACCINES  Aged Out   Colonoscopy  Discontinued   COVID-19 Vaccine  Discontinued    Health Maintenance  Health Maintenance Due  Topic Date Due   OPHTHALMOLOGY EXAM  Never done   Zoster Vaccines- Shingrix (2 of 2) 03/11/2022    Colorectal cancer screening: Type of screening: Colonoscopy. Completed 10/12/2022. Repeat every 7 years  Lung Cancer Screening: (Low Dose CT Chest recommended if Age 37-80 years, 20 pack-year currently smoking OR have quit w/in 15years.) does qualify.   Lung Cancer Screening Referral: 08/12/2022  Additional Screening:  Hepatitis C Screening: does qualify; Completed 04/26/2018  Vision Screening: Recommended annual ophthalmology exams for early detection of glaucoma and other disorders of the eye. Is the patient up to date with their annual eye exam?  No  Who is the provider or what is the name of the office in which the patient attends annual eye exams? N/A If pt is not established with a provider, would they like to be referred to  a provider to establish care? Yes .   Dental Screening: Recommended annual dental exams for proper oral hygiene  Diabetic Foot Exam: Diabetic Foot Exam: Completed 12/29/2022  Community Resource Referral / Chronic Care Management: CRR required this visit?  No   CCM required this visit?  No     Plan:     I have personally reviewed and noted the following in the patient's chart:   Medical and social history Use of alcohol, tobacco or illicit drugs  Current medications and supplements including opioid prescriptions. Patient is not currently taking opioid prescriptions. Functional ability and status Nutritional status Physical activity Advanced directives List of other physicians Hospitalizations, surgeries, and ER visits in previous 12 months Vitals Screenings to include cognitive, depression, and falls Referrals and appointments  In addition, I have reviewed and discussed with  patient certain preventive protocols, quality metrics, and best practice recommendations. A written personalized care plan for preventive services as well as general preventive health recommendations were provided to patient.     Cambre Matson L Akelia Husted, CMA   01/10/2023   After Visit Summary: (MyChart) Due to this being a telephonic visit, the after visit summary with patients personalized plan was offered to patient via MyChart   Nurse Notes: Patient is concerned about his memory, he states that he is forgetting more than usually.  It been happening  more often in the past year now.  Patient also need a recommendation for an eye doctor, he states it has been at least 3 years now since his last eye exam.  He is due for a 2nd Shingrix vaccine.

## 2023-01-10 NOTE — Patient Instructions (Signed)
Lee Mitchell , Thank you for taking time to come for your Medicare Wellness Visit. I appreciate your ongoing commitment to your health goals. Please review the following plan we discussed and let me know if I can assist you in the future.   Referrals/Orders/Follow-Ups/Clinician Recommendations: Remember to call the office to schedule your yearly physical with PCP.  Also remember to ask the question about the hemp/THC that is available in [place] as to whether it is ok or not, in his opinion.  You are due for the 2nd Shingles vaccine.  This is a list of the screening recommended for you and due dates:  Health Maintenance  Topic Date Due   Eye exam for diabetics  Never done   Zoster (Shingles) Vaccine (2 of 2) 03/11/2022   Hemoglobin A1C  06/28/2023   Yearly kidney function blood test for diabetes  12/29/2023   Yearly kidney health urinalysis for diabetes  12/29/2023   Complete foot exam   12/29/2023   Medicare Annual Wellness Visit  01/10/2024   Cologuard (Stool DNA test)  12/08/2024   DTaP/Tdap/Td vaccine (3 - Td or Tdap) 07/05/2025   Pneumonia Vaccine  Completed   Flu Shot  Completed   Hepatitis C Screening  Completed   HPV Vaccine  Aged Out   Colon Cancer Screening  Discontinued   COVID-19 Vaccine  Discontinued    Advanced directives: (Copy Requested) Please bring a copy of your health care power of attorney and living will to the office to be added to your chart at your convenience.  Next Medicare Annual Wellness Visit scheduled for next year: Yes

## 2023-01-19 ENCOUNTER — Other Ambulatory Visit: Payer: Self-pay | Admitting: Internal Medicine

## 2023-01-19 DIAGNOSIS — E890 Postprocedural hypothyroidism: Secondary | ICD-10-CM

## 2023-01-22 LAB — URINALYSIS, COMPLETE
Bilirubin, UA: NEGATIVE
Glucose, UA: NEGATIVE
Nitrite, UA: POSITIVE — AB
RBC, UA: NEGATIVE
Specific Gravity, UA: 1.027 (ref 1.005–1.030)
Urobilinogen, Ur: 1 mg/dL (ref 0.2–1.0)
pH, UA: 6 (ref 5.0–7.5)

## 2023-01-22 LAB — MICROSCOPIC EXAMINATION
Casts: NONE SEEN /[LPF]
Epithelial Cells (non renal): NONE SEEN /[HPF] (ref 0–10)
RBC, Urine: NONE SEEN /[HPF] (ref 0–2)

## 2023-01-22 LAB — SPECIMEN STATUS REPORT

## 2023-02-08 DIAGNOSIS — E119 Type 2 diabetes mellitus without complications: Secondary | ICD-10-CM | POA: Diagnosis not present

## 2023-02-08 DIAGNOSIS — H2513 Age-related nuclear cataract, bilateral: Secondary | ICD-10-CM | POA: Diagnosis not present

## 2023-02-08 DIAGNOSIS — H5213 Myopia, bilateral: Secondary | ICD-10-CM | POA: Diagnosis not present

## 2023-02-15 LAB — HM DIABETES EYE EXAM

## 2023-02-22 ENCOUNTER — Ambulatory Visit: Payer: Medicare Other | Admitting: Internal Medicine

## 2023-02-22 ENCOUNTER — Encounter: Payer: Self-pay | Admitting: Internal Medicine

## 2023-02-22 VITALS — BP 136/74 | HR 87 | Temp 98.3°F | Resp 16 | Ht 74.0 in | Wt 165.6 lb

## 2023-02-22 DIAGNOSIS — L2084 Intrinsic (allergic) eczema: Secondary | ICD-10-CM | POA: Diagnosis not present

## 2023-02-22 DIAGNOSIS — I10 Essential (primary) hypertension: Secondary | ICD-10-CM

## 2023-02-22 DIAGNOSIS — E118 Type 2 diabetes mellitus with unspecified complications: Secondary | ICD-10-CM | POA: Diagnosis not present

## 2023-02-22 DIAGNOSIS — Z0000 Encounter for general adult medical examination without abnormal findings: Secondary | ICD-10-CM

## 2023-02-22 DIAGNOSIS — Z7984 Long term (current) use of oral hypoglycemic drugs: Secondary | ICD-10-CM

## 2023-02-22 DIAGNOSIS — Z0001 Encounter for general adult medical examination with abnormal findings: Secondary | ICD-10-CM | POA: Insufficient documentation

## 2023-02-22 MED ORDER — HYDROXYZINE HCL 10 MG PO TABS
10.0000 mg | ORAL_TABLET | Freq: Three times a day (TID) | ORAL | 0 refills | Status: DC | PRN
Start: 2023-02-22 — End: 2023-08-17

## 2023-02-22 NOTE — Patient Instructions (Signed)
Health Maintenance, Male Adopting a healthy lifestyle and getting preventive care are important in promoting health and wellness. Ask your health care provider about: The right schedule for you to have regular tests and exams. Things you can do on your own to prevent diseases and keep yourself healthy. What should I know about diet, weight, and exercise? Eat a healthy diet  Eat a diet that includes plenty of vegetables, fruits, low-fat dairy products, and lean protein. Do not eat a lot of foods that are high in solid fats, added sugars, or sodium. Maintain a healthy weight Body mass index (BMI) is a measurement that can be used to identify possible weight problems. It estimates body fat based on height and weight. Your health care provider can help determine your BMI and help you achieve or maintain a healthy weight. Get regular exercise Get regular exercise. This is one of the most important things you can do for your health. Most adults should: Exercise for at least 150 minutes each week. The exercise should increase your heart rate and make you sweat (moderate-intensity exercise). Do strengthening exercises at least twice a week. This is in addition to the moderate-intensity exercise. Spend less time sitting. Even light physical activity can be beneficial. Watch cholesterol and blood lipids Have your blood tested for lipids and cholesterol at 70 years of age, then have this test every 5 years. You may need to have your cholesterol levels checked more often if: Your lipid or cholesterol levels are high. You are older than 70 years of age. You are at high risk for heart disease. What should I know about cancer screening? Many types of cancers can be detected early and may often be prevented. Depending on your health history and family history, you may need to have cancer screening at various ages. This may include screening for: Colorectal cancer. Prostate cancer. Skin cancer. Lung  cancer. What should I know about heart disease, diabetes, and high blood pressure? Blood pressure and heart disease High blood pressure causes heart disease and increases the risk of stroke. This is more likely to develop in people who have high blood pressure readings or are overweight. Talk with your health care provider about your target blood pressure readings. Have your blood pressure checked: Every 3-5 years if you are 18-39 years of age. Every year if you are 40 years old or older. If you are between the ages of 65 and 75 and are a current or former smoker, ask your health care provider if you should have a one-time screening for abdominal aortic aneurysm (AAA). Diabetes Have regular diabetes screenings. This checks your fasting blood sugar level. Have the screening done: Once every three years after age 45 if you are at a normal weight and have a low risk for diabetes. More often and at a younger age if you are overweight or have a high risk for diabetes. What should I know about preventing infection? Hepatitis B If you have a higher risk for hepatitis B, you should be screened for this virus. Talk with your health care provider to find out if you are at risk for hepatitis B infection. Hepatitis C Blood testing is recommended for: Everyone born from 1945 through 1965. Anyone with known risk factors for hepatitis C. Sexually transmitted infections (STIs) You should be screened each year for STIs, including gonorrhea and chlamydia, if: You are sexually active and are younger than 70 years of age. You are older than 70 years of age and your   health care provider tells you that you are at risk for this type of infection. Your sexual activity has changed since you were last screened, and you are at increased risk for chlamydia or gonorrhea. Ask your health care provider if you are at risk. Ask your health care provider about whether you are at high risk for HIV. Your health care provider  may recommend a prescription medicine to help prevent HIV infection. If you choose to take medicine to prevent HIV, you should first get tested for HIV. You should then be tested every 3 months for as long as you are taking the medicine. Follow these instructions at home: Alcohol use Do not drink alcohol if your health care provider tells you not to drink. If you drink alcohol: Limit how much you have to 0-2 drinks a day. Know how much alcohol is in your drink. In the [place] one drink equals one 12 oz bottle of beer (355 mL), one 5 oz glass of wine (148 mL), or one 1 oz glass of hard liquor (44 mL). Lifestyle Do not use any products that contain nicotine or tobacco. These products include cigarettes, chewing tobacco, and vaping devices, such as e-cigarettes. If you need help quitting, ask your health care provider. Do not use street drugs. Do not share needles. Ask your health care provider for help if you need support or information about quitting drugs. General instructions Schedule regular health, dental, and eye exams. Stay current with your vaccines. Tell your health care provider if: You often feel depressed. You have ever been abused or do not feel safe at home. Summary Adopting a healthy lifestyle and getting preventive care are important in promoting health and wellness. Follow your health care provider's instructions about healthy diet, exercising, and getting tested or screened for diseases. Follow your health care provider's instructions on monitoring your cholesterol and blood pressure. This information is not intended to replace advice given to you by your health care provider. Make sure you discuss any questions you have with your health care provider. Document Revised: 08/25/2020 Document Reviewed: 08/25/2020 Elsevier Patient Education  2024 [employer]  
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
## 2023-02-23 NOTE — Telephone Encounter (Signed)
Patient is calling wanting to know if his pain medication will be called in today.

## 2023-02-23 NOTE — Telephone Encounter (Signed)
Please advise I don't see the medication on his med list.

## 2023-04-04 ENCOUNTER — Encounter: Payer: Self-pay | Admitting: Internal Medicine

## 2023-04-04 NOTE — Progress Notes (Signed)
Documentation stated that patient was previously seen by Dr. Hyacinth Meeker in 2023 and a referral was placed in 2024 to see Dr. Dione Booze. Letter sent to both location to find the documentation to support the abstract order in November for completed DM eye exam.

## 2023-04-07 ENCOUNTER — Other Ambulatory Visit: Payer: Self-pay

## 2023-04-07 DIAGNOSIS — J432 Centrilobular emphysema: Secondary | ICD-10-CM

## 2023-04-07 DIAGNOSIS — I2510 Atherosclerotic heart disease of native coronary artery without angina pectoris: Secondary | ICD-10-CM

## 2023-04-07 DIAGNOSIS — K219 Gastro-esophageal reflux disease without esophagitis: Secondary | ICD-10-CM

## 2023-04-07 DIAGNOSIS — E785 Hyperlipidemia, unspecified: Secondary | ICD-10-CM

## 2023-04-07 DIAGNOSIS — E890 Postprocedural hypothyroidism: Secondary | ICD-10-CM

## 2023-04-07 NOTE — Telephone Encounter (Signed)
Copied from CRM [id number]. Topic: Clinical - Medication Refill >> Apr 07, 2023 10:21 AM Orinda Kenner C wrote: Most Recent Primary Care Visit:  Provider: Etta Grandchild  Department: [facility]  Visit Type: OFFICE VISIT  Date: 02/22/2023  Medication: albuterol (VENTOLIN HFA) 108 (90 Base) MCG/ACT inhaler ezetimibe (ZETIA) 10 MG tablet  mirtazapine (REMERON) 30 MG tablet  omeprazole ER (PRILOSEC) 40 MG capsule  tamsulosin (FLOMAX) 0.4 MG CAPS capsule  rosuvastatin (CRESTOR) 40 MG tablet  levothyroxine (SYNTHROID) 150 MCG tablet   Has the patient contacted their pharmacy? Yes, the pharmacy advised pt to contact the office since pt is transferring meds.  (Agent: If no, request that the patient contact the pharmacy for the refill. If patient does not wish to contact the pharmacy document the reason why and proceed with request.) (Agent: If yes, when and what did the pharmacy advise?)  Is this the correct pharmacy for this prescription? Yes If no, delete pharmacy and type the correct one.  This is the patient's preferred pharmacy:  [id number] G[place] Otto, [place] - [address] AT [facility] OF [facility] & [address] [place] [address] Phone: [phone] Fax: [phone]  Has the prescription been filled recently?   Is the patient out of the medication? Yes, pt needs refills by tomorrow.   Has the patient been seen for an appointment in the last year OR does the patient have an upcoming appointment?   Can we respond through MyChart? No, pls c/b [phone]  Agent: Please be advised that Rx refills may take up to 3 business days. We ask that you follow-up with your pharmacy.

## 2023-04-11 ENCOUNTER — Other Ambulatory Visit: Payer: Self-pay | Admitting: Internal Medicine

## 2023-04-11 ENCOUNTER — Telehealth: Payer: Self-pay | Admitting: Internal Medicine

## 2023-04-11 ENCOUNTER — Ambulatory Visit: Payer: Self-pay | Admitting: Internal Medicine

## 2023-04-11 DIAGNOSIS — K219 Gastro-esophageal reflux disease without esophagitis: Secondary | ICD-10-CM

## 2023-04-11 DIAGNOSIS — E890 Postprocedural hypothyroidism: Secondary | ICD-10-CM

## 2023-04-11 DIAGNOSIS — E785 Hyperlipidemia, unspecified: Secondary | ICD-10-CM

## 2023-04-11 DIAGNOSIS — J4489 Other specified chronic obstructive pulmonary disease: Secondary | ICD-10-CM

## 2023-04-11 DIAGNOSIS — J432 Centrilobular emphysema: Secondary | ICD-10-CM

## 2023-04-11 DIAGNOSIS — F332 Major depressive disorder, recurrent severe without psychotic features: Secondary | ICD-10-CM

## 2023-04-11 DIAGNOSIS — N400 Enlarged prostate without lower urinary tract symptoms: Secondary | ICD-10-CM

## 2023-04-11 DIAGNOSIS — I2510 Atherosclerotic heart disease of native coronary artery without angina pectoris: Secondary | ICD-10-CM

## 2023-04-11 MED ORDER — LEVOTHYROXINE SODIUM 150 MCG PO TABS: 150.0000 ug | ORAL_TABLET | Freq: Every day | ORAL | 0 refills | Status: DC

## 2023-04-11 MED ORDER — TRELEGY ELLIPTA 100-62.5-25 MCG/ACT IN AEPB: 1.0000 | INHALATION_SPRAY | Freq: Every day | RESPIRATORY_TRACT | 0 refills | Status: DC

## 2023-04-11 MED ORDER — ROSUVASTATIN CALCIUM 40 MG PO TABS: 40.0000 mg | ORAL_TABLET | Freq: Every day | ORAL | 0 refills | Status: DC

## 2023-04-11 MED ORDER — MIRTAZAPINE 30 MG PO TABS: 30.0000 mg | ORAL_TABLET | Freq: Every day | ORAL | 0 refills | Status: DC

## 2023-04-11 MED ORDER — OMEPRAZOLE 40 MG PO CPDR: 40.0000 mg | DELAYED_RELEASE_CAPSULE | Freq: Every day | ORAL | 0 refills | Status: DC

## 2023-04-11 MED ORDER — EZETIMIBE 10 MG PO TABS: 10.0000 mg | ORAL_TABLET | Freq: Every day | ORAL | 1 refills | Status: DC

## 2023-04-11 MED ORDER — ALBUTEROL SULFATE HFA 108 (90 BASE) MCG/ACT IN AERS: 1.0000 | INHALATION_SPRAY | Freq: Four times a day (QID) | RESPIRATORY_TRACT | 5 refills | Status: DC | PRN

## 2023-04-11 MED ORDER — TAMSULOSIN HCL 0.4 MG PO CAPS: 0.4000 mg | ORAL_CAPSULE | Freq: Every day | ORAL | 0 refills | Status: DC

## 2023-04-11 NOTE — Telephone Encounter (Signed)
Reason for Disposition . [1] Prescription refill request for ESSENTIAL medicine (i.e., likelihood of harm to patient if not taken) AND [2] triager unable to refill per department policy  Answer Assessment - Initial Assessment Questions 1. DRUG NAME: "What medicine do you need to have refilled?"     Albuterol  2. REFILLS REMAINING: "How many refills are remaining?" (Note: The label on the medicine or pill bottle will show how many refills are remaining. If there are no refills remaining, then a renewal may be needed.)     na 3. EXPIRATION DATE: "What is the expiration date?" (Note: The label states when the prescription will expire, and thus can no longer be refilled.)     na 4. PRESCRIBING HCP: "Who prescribed it?" Reason: If prescribed by specialist, call should be referred to that group.     Dr Yetta Barre 5. SYMPTOMS: "Do you have any symptoms?"     SOB  Protocols used: Medication Refill and Renewal Call-A-AH

## 2023-04-11 NOTE — Telephone Encounter (Signed)
  Chief Complaint: SOB/med refill Symptoms: SOB Frequency: mornings is worse  Disposition: [] ED /[] Urgent Care (no appt availability in office) / [] Appointment(In office/virtual)/ []  [facility]/ [] Home Care/ [] Refused Recommended Disposition /[] [place] [facility]/ [x]  Follow-up with PCP Additional Notes: Pt calling about med refill. Pt called on 12/19 requesting refills.  Pt out of all meds. Pt complains of SOB upon wakening and when exerting himself. Pt currently out shopping and didn't feel the need to be seen in clinic. Pt was reluctant to be triaged. Pt stated, "I know what it is like to not breathe. I spent 18 days in hospital."  Pt just wants med refill. E2C2 nurse called [facility] on [address] and med order not there. Called CAL line and spoke with Shanda Bumps who stated she was going to send med order in. E2C2 prioritized the need for albuterol medication. Called pt back to give up date and pt denies all negative symptoms related to SOB. RN instructed pt to all call with any other issues.          Copied from CRM [id number]. Topic: Clinical - Red Word Triage >> Apr 11, 2023 11:10 AM Florestine Avers wrote: Red Word that prompted transfer to Nurse Triage: pt called in to f/u about albuterol rx, said he is having a hard time breathing. Answer Assessment - Initial Assessment Questions 1. RESPIRATORY STATUS: "Describe your breathing?" (e.g., wheezing, shortness of breath, unable to speak, severe coughing)      SOB awakening or walking lots, talks too much 2. ONSET: "When did this breathing problem begin?"      Out of medication this happpens 3. PATTERN "Does the difficult breathing come and go, or has it been constant since it started?"      constant 4. SEVERITY: "How bad is your breathing?" (e.g., mild, moderate, severe)    - MILD: No SOB at rest, mild SOB with walking, speaks normally in sentences, can lie down, no retractions, pulse < 100.    - MODERATE: SOB at rest, SOB  with minimal exertion and prefers to sit, cannot lie down flat, speaks in phrases, mild retractions, audible wheezing, pulse 100-120.    - SEVERE: Very SOB at rest, speaks in single words, struggling to breathe, sitting hunched forward, retractions, pulse > 120      moderate 5. RECURRENT SYMPTOM: "Have you had difficulty breathing before?" If Yes, ask: "When was the last time?" and "What happened that time?"      Yes, when out of albuterol 6. CARDIAC HISTORY: "Do you have any history of heart disease?" (e.g., heart attack, angina, bypass surgery, angioplasty)      no 7. LUNG HISTORY: "Do you have any history of lung disease?"  (e.g., pulmonary embolus, asthma, emphysema)     no 8. CAUSE: "What do you think is causing the breathing problem?"      Out of medication 9. OTHER SYMPTOMS: "Do you have any other symptoms? (e.g., dizziness, runny nose, cough, chest pain, fever)     Cough, runny nose 10. O2 SATURATION MONITOR:  "Do you use an oxygen saturation monitor (pulse oximeter) at home?" If Yes, ask: "What is your reading (oxygen level) today?" "What is your usual oxygen saturation reading?" (e.g., 95%)       na  12. TRAVEL: "Have you traveled out of the country in the last month?" (e.g., travel history, exposures)       no  Protocols used: Breathing Difficulty-A-AH

## 2023-04-11 NOTE — Telephone Encounter (Signed)
Prescription Request  04/11/2023  LOV: 02/22/2023  What is the name of the medication or equipment?  albuterol (VENTOLIN HFA) 108 (90 Base) MCG/ACT inhaler   levothyroxine (SYNTHROID) 150 MCG tablet  ezetimibe (ZETIA) 10 MG tablet   mirtazapine (REMERON) 30 MG tablet  tamsulosin (FLOMAX) 0.4 MG CAPS capsule  rosuvastatin (CRESTOR) 40 MG tablet   omeprazole (PRILOSEC) 40 MG capsule    Have you contacted your pharmacy to request a refill?   No Which pharmacy would you like this sent to?  [id number] [place] Otto,  - [address] AT [facility] OF [facility] & [address] [place] [address] Phone: [phone] Fax: [phone]    Patient notified that their request is being sent to the clinical staff for review and that they should receive a response within 2 business days.   Please advise at Mobile [phone] (mobile)

## 2023-04-14 NOTE — Telephone Encounter (Signed)
Medication was refilled and the patient states that he's been feeling better since having his medication.

## 2023-04-19 ENCOUNTER — Other Ambulatory Visit: Payer: Self-pay | Admitting: Internal Medicine

## 2023-04-19 DIAGNOSIS — E890 Postprocedural hypothyroidism: Secondary | ICD-10-CM

## 2023-05-03 ENCOUNTER — Telehealth: Payer: Self-pay

## 2023-05-03 NOTE — Telephone Encounter (Signed)
 PT NOT ENROLLED AT THIS TIME. [facility] STATES NOT RECORD OF HIS APPLICATION.

## 2023-06-23 ENCOUNTER — Other Ambulatory Visit: Payer: Self-pay

## 2023-06-23 ENCOUNTER — Other Ambulatory Visit: Payer: Self-pay | Admitting: Internal Medicine

## 2023-07-26 ENCOUNTER — Encounter: Payer: Self-pay | Admitting: Internal Medicine

## 2023-07-27 ENCOUNTER — Other Ambulatory Visit: Payer: Self-pay

## 2023-07-27 DIAGNOSIS — I2510 Atherosclerotic heart disease of native coronary artery without angina pectoris: Secondary | ICD-10-CM

## 2023-07-27 DIAGNOSIS — K219 Gastro-esophageal reflux disease without esophagitis: Secondary | ICD-10-CM

## 2023-07-27 DIAGNOSIS — F332 Major depressive disorder, recurrent severe without psychotic features: Secondary | ICD-10-CM

## 2023-07-27 DIAGNOSIS — E785 Hyperlipidemia, unspecified: Secondary | ICD-10-CM

## 2023-07-27 DIAGNOSIS — N400 Enlarged prostate without lower urinary tract symptoms: Secondary | ICD-10-CM

## 2023-07-27 MED ORDER — MIRTAZAPINE 30 MG PO TABS: 30.0000 mg | ORAL_TABLET | Freq: Every day | ORAL | 0 refills | Status: DC

## 2023-07-27 MED ORDER — OMEPRAZOLE 40 MG PO CPDR: 40.0000 mg | DELAYED_RELEASE_CAPSULE | Freq: Every day | ORAL | 0 refills | Status: DC

## 2023-07-27 MED ORDER — TAMSULOSIN HCL 0.4 MG PO CAPS: 0.4000 mg | ORAL_CAPSULE | Freq: Every day | ORAL | 0 refills | Status: AC

## 2023-07-27 MED ORDER — ROSUVASTATIN CALCIUM 40 MG PO TABS: 40.0000 mg | ORAL_TABLET | Freq: Every day | ORAL | 0 refills | Status: DC

## 2023-07-27 MED ORDER — EZETIMIBE 10 MG PO TABS: 10.0000 mg | ORAL_TABLET | Freq: Every day | ORAL | 0 refills | Status: DC

## 2023-08-17 ENCOUNTER — Ambulatory Visit: Admitting: Internal Medicine

## 2023-08-17 ENCOUNTER — Encounter: Payer: Self-pay | Admitting: Internal Medicine

## 2023-08-17 ENCOUNTER — Ambulatory Visit (INDEPENDENT_AMBULATORY_CARE_PROVIDER_SITE_OTHER)

## 2023-08-17 ENCOUNTER — Other Ambulatory Visit: Payer: Self-pay

## 2023-08-17 VITALS — BP 120/74 | HR 78 | Temp 98.7°F | Resp 16 | Ht 74.0 in | Wt 165.8 lb

## 2023-08-17 DIAGNOSIS — M150 Primary generalized (osteo)arthritis: Secondary | ICD-10-CM

## 2023-08-17 DIAGNOSIS — R072 Precordial pain: Secondary | ICD-10-CM

## 2023-08-17 DIAGNOSIS — M5136 Other intervertebral disc degeneration, lumbar region with discogenic back pain only: Secondary | ICD-10-CM

## 2023-08-17 DIAGNOSIS — M5030 Other cervical disc degeneration, unspecified cervical region: Secondary | ICD-10-CM | POA: Diagnosis not present

## 2023-08-17 DIAGNOSIS — N400 Enlarged prostate without lower urinary tract symptoms: Secondary | ICD-10-CM

## 2023-08-17 DIAGNOSIS — E890 Postprocedural hypothyroidism: Secondary | ICD-10-CM

## 2023-08-17 DIAGNOSIS — R972 Elevated prostate specific antigen [PSA]: Secondary | ICD-10-CM

## 2023-08-17 DIAGNOSIS — R0609 Other forms of dyspnea: Secondary | ICD-10-CM

## 2023-08-17 DIAGNOSIS — I10 Essential (primary) hypertension: Secondary | ICD-10-CM | POA: Diagnosis not present

## 2023-08-17 DIAGNOSIS — E1140 Type 2 diabetes mellitus with diabetic neuropathy, unspecified: Secondary | ICD-10-CM

## 2023-08-17 DIAGNOSIS — I2510 Atherosclerotic heart disease of native coronary artery without angina pectoris: Secondary | ICD-10-CM

## 2023-08-17 DIAGNOSIS — J439 Emphysema, unspecified: Secondary | ICD-10-CM | POA: Diagnosis not present

## 2023-08-17 DIAGNOSIS — E118 Type 2 diabetes mellitus with unspecified complications: Secondary | ICD-10-CM | POA: Diagnosis not present

## 2023-08-17 DIAGNOSIS — R079 Chest pain, unspecified: Secondary | ICD-10-CM | POA: Diagnosis not present

## 2023-08-17 DIAGNOSIS — J4489 Other specified chronic obstructive pulmonary disease: Secondary | ICD-10-CM

## 2023-08-17 DIAGNOSIS — J432 Centrilobular emphysema: Secondary | ICD-10-CM

## 2023-08-17 DIAGNOSIS — E785 Hyperlipidemia, unspecified: Secondary | ICD-10-CM | POA: Diagnosis not present

## 2023-08-17 LAB — CBC WITH DIFFERENTIAL/PLATELET
Basophils Absolute: 0.1 10*3/uL (ref 0.0–0.1)
Basophils Relative: 0.7 % (ref 0.0–3.0)
Eosinophils Absolute: 0.1 10*3/uL (ref 0.0–0.7)
Eosinophils Relative: 1.2 % (ref 0.0–5.0)
HCT: 41.8 % (ref 39.0–52.0)
Hemoglobin: 13.8 g/dL (ref 13.0–17.0)
Lymphocytes Relative: 19.9 % (ref 12.0–46.0)
Lymphs Abs: 2.3 10*3/uL (ref 0.7–4.0)
MCHC: 32.9 g/dL (ref 30.0–36.0)
MCV: 84.5 fl (ref 78.0–100.0)
Monocytes Absolute: 0.8 10*3/uL (ref 0.1–1.0)
Monocytes Relative: 6.8 % (ref 3.0–12.0)
Neutro Abs: 8.2 10*3/uL — ABNORMAL HIGH (ref 1.4–7.7)
Neutrophils Relative %: 71.4 % (ref 43.0–77.0)
Platelets: 387 10*3/uL (ref 150.0–400.0)
RBC: 4.95 Mil/uL (ref 4.22–5.81)
RDW: 16.3 % — ABNORMAL HIGH (ref 11.5–15.5)
WBC: 11.5 10*3/uL — ABNORMAL HIGH (ref 4.0–10.5)

## 2023-08-17 LAB — BASIC METABOLIC PANEL WITH GFR
BUN: 14 mg/dL (ref 6–23)
CO2: 29 meq/L (ref 19–32)
Calcium: 9.2 mg/dL (ref 8.4–10.5)
Chloride: 99 meq/L (ref 96–112)
Creatinine, Ser: 1.07 mg/dL (ref 0.40–1.50)
GFR: 70.27 mL/min (ref >60.00)
Glucose, Bld: 102 mg/dL — ABNORMAL HIGH (ref 70–99)
Potassium: 4.2 meq/L (ref 3.5–5.1)
Sodium: 136 meq/L (ref 135–145)

## 2023-08-17 LAB — LIPID PANEL
Cholesterol: 129 mg/dL (ref 0–200)
HDL: 48.9 mg/dL (ref >39.00)
LDL Cholesterol: 57 mg/dL (ref 0–99)
NonHDL: 79.83
Total CHOL/HDL Ratio: 3
Triglycerides: 115 mg/dL (ref 0.0–149.0)
VLDL: 23 mg/dL (ref 0.0–40.0)

## 2023-08-17 LAB — MICROALBUMIN / CREATININE URINE RATIO
Creatinine,U: 166.6 mg/dL
Microalb Creat Ratio: 23.5 mg/g (ref 0.0–30.0)
Microalb, Ur: 3.9 mg/dL — ABNORMAL HIGH (ref 0.0–1.9)

## 2023-08-17 LAB — URINALYSIS, ROUTINE W REFLEX MICROSCOPIC
Bilirubin Urine: NEGATIVE
Hgb urine dipstick: NEGATIVE
Ketones, ur: NEGATIVE
Leukocytes,Ua: NEGATIVE
Nitrite: NEGATIVE
Specific Gravity, Urine: 1.01 (ref 1.000–1.030)
Total Protein, Urine: NEGATIVE
Urine Glucose: NEGATIVE
Urobilinogen, UA: 0.2 (ref 0.0–1.0)
pH: 6.5 (ref 5.0–8.0)

## 2023-08-17 LAB — HEPATIC FUNCTION PANEL
ALT: 18 U/L (ref 0–53)
AST: 16 U/L (ref 0–37)
Albumin: 4.7 g/dL (ref 3.5–5.2)
Alkaline Phosphatase: 88 U/L (ref 39–117)
Bilirubin, Direct: 0.1 mg/dL (ref 0.0–0.3)
Total Bilirubin: 0.5 mg/dL (ref 0.2–1.2)
Total Protein: 7.1 g/dL (ref 6.0–8.3)

## 2023-08-17 LAB — BRAIN NATRIURETIC PEPTIDE: Pro B Natriuretic peptide (BNP): 29 pg/mL (ref 0.0–100.0)

## 2023-08-17 LAB — PSA: PSA: 8.17 ng/mL — ABNORMAL HIGH (ref 0.10–4.00)

## 2023-08-17 LAB — D-DIMER, QUANTITATIVE: D-Dimer, Quant: 0.59 ug{FEU}/mL — ABNORMAL HIGH (ref <0.50)

## 2023-08-17 LAB — HEMOGLOBIN A1C: Hgb A1c MFr Bld: 7 % — ABNORMAL HIGH (ref 4.6–6.5)

## 2023-08-17 LAB — TROPONIN I (HIGH SENSITIVITY): High Sens Troponin I: 5 ng/L (ref 2–17)

## 2023-08-17 LAB — TSH: TSH: 12.37 u[IU]/mL — ABNORMAL HIGH (ref 0.35–5.50)

## 2023-08-17 MED ORDER — LEVOTHYROXINE SODIUM 150 MCG PO TABS: 150.0000 ug | ORAL_TABLET | Freq: Every day | ORAL | 0 refills | Status: DC

## 2023-08-17 MED ORDER — ALBUTEROL SULFATE HFA 108 (90 BASE) MCG/ACT IN AERS: 1.0000 | INHALATION_SPRAY | Freq: Four times a day (QID) | RESPIRATORY_TRACT | 5 refills | Status: DC | PRN

## 2023-08-17 MED ORDER — TRELEGY ELLIPTA 100-62.5-25 MCG/ACT IN AEPB: 1.0000 | INHALATION_SPRAY | Freq: Every day | RESPIRATORY_TRACT | 0 refills | Status: AC

## 2023-08-17 MED ORDER — XTAMPZA ER 9 MG PO C12A: 1.0000 | EXTENDED_RELEASE_CAPSULE | Freq: Two times a day (BID) | ORAL | 0 refills | Status: DC | PRN

## 2023-08-17 NOTE — Patient Instructions (Signed)
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
## 2023-09-02 ENCOUNTER — Telehealth (HOSPITAL_COMMUNITY): Payer: Self-pay | Admitting: *Deleted

## 2023-09-02 ENCOUNTER — Encounter (HOSPITAL_COMMUNITY): Payer: Self-pay

## 2023-09-02 ENCOUNTER — Other Ambulatory Visit (HOSPITAL_COMMUNITY): Payer: Self-pay | Admitting: *Deleted

## 2023-09-02 MED ORDER — METOPROLOL TARTRATE 100 MG PO TABS: ORAL_TABLET | ORAL | 0 refills | Status: AC

## 2023-09-02 NOTE — Telephone Encounter (Signed)
 Reaching out to patient to offer assistance regarding upcoming cardiac imaging study; pt verbalizes understanding of appt date/time, parking situation and where to check in, pre-test NPO status and medications ordered, and verified current allergies; name and call back number provided for further questions should they arise  Larey Brick RN Navigator Cardiac Imaging [facility] [phone] office [phone] cell  Patient to take 50mg  metoprolol tartrate two hours prior to his cardiac CT scan.

## 2023-09-02 NOTE — Telephone Encounter (Signed)
 Attempted to call patient regarding upcoming cardiac CT appointment. Left message on voicemail with name and callback number  Larey Brick RN Navigator Cardiac Imaging [facility] [phone] Office [phone] Cell

## 2023-09-05 ENCOUNTER — Ambulatory Visit (HOSPITAL_COMMUNITY)
Admission: RE | Admit: 2023-09-05 | Discharge: 2023-09-05 | Disposition: A | Discharge status: Home / Self Care | Source: Ambulatory Visit | Attending: Internal Medicine | Admitting: Internal Medicine

## 2023-09-05 DIAGNOSIS — R072 Precordial pain: Secondary | ICD-10-CM | POA: Diagnosis not present

## 2023-09-05 DIAGNOSIS — R0609 Other forms of dyspnea: Secondary | ICD-10-CM | POA: Insufficient documentation

## 2023-09-05 DIAGNOSIS — I2510 Atherosclerotic heart disease of native coronary artery without angina pectoris: Secondary | ICD-10-CM | POA: Diagnosis not present

## 2023-09-05 MED ORDER — IOHEXOL 350 MG/ML SOLN
100.0000 mL | Freq: Once | INTRAVENOUS | Status: AC | PRN
  Administered 2023-09-05: 100 mL via INTRAVENOUS

## 2023-09-05 MED ORDER — NITROGLYCERIN 0.4 MG SL SUBL
SUBLINGUAL_TABLET | SUBLINGUAL | Status: AC
Start: 2023-09-05 — End: ?
  Filled 2023-09-05: qty 2

## 2023-09-05 MED ORDER — NITROGLYCERIN 0.4 MG SL SUBL
0.8000 mg | SUBLINGUAL_TABLET | Freq: Once | SUBLINGUAL | Status: AC
  Administered 2023-09-05: 0.8 mg via SUBLINGUAL

## 2023-09-05 NOTE — Progress Notes (Signed)
 Pt denies symptoms.

## 2023-09-05 NOTE — Progress Notes (Signed)
 Pt. Reported during pre scan prep that over the past month and a half his appetite has diminished and has been having to force feed himself for nutrition. Pt. Also asked about time of when he would receive his results of today's cardiac scan after stating that over the past week he had felt like he needed to go to the Emergency Room due to shortness of breath but did not want to have this scan repeated if he were to decide if he needed to go today or tomorrow.

## 2023-09-06 ENCOUNTER — Emergency Department (HOSPITAL_COMMUNITY)

## 2023-09-06 ENCOUNTER — Other Ambulatory Visit: Payer: Self-pay

## 2023-09-06 ENCOUNTER — Emergency Department (HOSPITAL_COMMUNITY)
Admission: EM | Admit: 2023-09-06 | Discharge: 2023-09-06 | Disposition: A | Discharge status: Home / Self Care | Attending: Emergency Medicine | Admitting: Emergency Medicine

## 2023-09-06 DIAGNOSIS — J449 Chronic obstructive pulmonary disease, unspecified: Secondary | ICD-10-CM | POA: Diagnosis not present

## 2023-09-06 DIAGNOSIS — R5383 Other fatigue: Secondary | ICD-10-CM | POA: Diagnosis not present

## 2023-09-06 DIAGNOSIS — I2510 Atherosclerotic heart disease of native coronary artery without angina pectoris: Secondary | ICD-10-CM | POA: Diagnosis not present

## 2023-09-06 DIAGNOSIS — I6782 Cerebral ischemia: Secondary | ICD-10-CM | POA: Diagnosis not present

## 2023-09-06 DIAGNOSIS — Z7984 Long term (current) use of oral hypoglycemic drugs: Secondary | ICD-10-CM | POA: Diagnosis not present

## 2023-09-06 DIAGNOSIS — R29818 Other symptoms and signs involving the nervous system: Secondary | ICD-10-CM | POA: Diagnosis not present

## 2023-09-06 DIAGNOSIS — R1031 Right lower quadrant pain: Secondary | ICD-10-CM | POA: Insufficient documentation

## 2023-09-06 DIAGNOSIS — R0789 Other chest pain: Secondary | ICD-10-CM | POA: Insufficient documentation

## 2023-09-06 DIAGNOSIS — R300 Dysuria: Secondary | ICD-10-CM | POA: Insufficient documentation

## 2023-09-06 DIAGNOSIS — R479 Unspecified speech disturbances: Secondary | ICD-10-CM

## 2023-09-06 DIAGNOSIS — G319 Degenerative disease of nervous system, unspecified: Secondary | ICD-10-CM | POA: Diagnosis not present

## 2023-09-06 DIAGNOSIS — R0602 Shortness of breath: Secondary | ICD-10-CM | POA: Diagnosis not present

## 2023-09-06 DIAGNOSIS — R109 Unspecified abdominal pain: Secondary | ICD-10-CM | POA: Diagnosis not present

## 2023-09-06 DIAGNOSIS — E119 Type 2 diabetes mellitus without complications: Secondary | ICD-10-CM | POA: Diagnosis not present

## 2023-09-06 DIAGNOSIS — D1803 Hemangioma of intra-abdominal structures: Secondary | ICD-10-CM | POA: Diagnosis not present

## 2023-09-06 DIAGNOSIS — R4789 Other speech disturbances: Secondary | ICD-10-CM | POA: Insufficient documentation

## 2023-09-06 DIAGNOSIS — Z85850 Personal history of malignant neoplasm of thyroid: Secondary | ICD-10-CM | POA: Diagnosis not present

## 2023-09-06 DIAGNOSIS — J439 Emphysema, unspecified: Secondary | ICD-10-CM | POA: Diagnosis not present

## 2023-09-06 DIAGNOSIS — R079 Chest pain, unspecified: Secondary | ICD-10-CM

## 2023-09-06 DIAGNOSIS — R410 Disorientation, unspecified: Secondary | ICD-10-CM | POA: Diagnosis not present

## 2023-09-06 DIAGNOSIS — N281 Cyst of kidney, acquired: Secondary | ICD-10-CM | POA: Diagnosis not present

## 2023-09-06 LAB — COMPREHENSIVE METABOLIC PANEL WITH GFR
ALT: 40 U/L (ref 0–44)
AST: 27 U/L (ref 15–41)
Albumin: 3.7 g/dL (ref 3.5–5.0)
Alkaline Phosphatase: 67 U/L (ref 38–126)
Anion gap: 10 (ref 5–15)
BUN: 14 mg/dL (ref 8–23)
CO2: 21 mmol/L — ABNORMAL LOW (ref 22–32)
Calcium: 9 mg/dL (ref 8.9–10.3)
Chloride: 107 mmol/L (ref 98–111)
Creatinine, Ser: 0.8 mg/dL (ref 0.61–1.24)
GFR, Estimated: >60 mL/min (ref >60)
Glucose, Bld: 103 mg/dL — ABNORMAL HIGH (ref 70–99)
Potassium: 3.9 mmol/L (ref 3.5–5.1)
Sodium: 138 mmol/L (ref 135–145)
Total Bilirubin: 0.7 mg/dL (ref 0.0–1.2)
Total Protein: 6.8 g/dL (ref 6.5–8.1)

## 2023-09-06 LAB — URINALYSIS, W/ REFLEX TO CULTURE (INFECTION SUSPECTED)
Bacteria, UA: NONE SEEN
Bilirubin Urine: NEGATIVE
Glucose, UA: NEGATIVE mg/dL
Hgb urine dipstick: NEGATIVE
Ketones, ur: 5 mg/dL — AB
Leukocytes,Ua: NEGATIVE
Nitrite: NEGATIVE
Protein, ur: 100 mg/dL — AB
Specific Gravity, Urine: 1.03 (ref 1.005–1.030)
pH: 6 (ref 5.0–8.0)

## 2023-09-06 LAB — CBC WITH DIFFERENTIAL/PLATELET
Abs Immature Granulocytes: 0.13 10*3/uL — ABNORMAL HIGH (ref 0.00–0.07)
Basophils Absolute: 0.1 10*3/uL (ref 0.0–0.1)
Basophils Relative: 1 %
Eosinophils Absolute: 0 10*3/uL (ref 0.0–0.5)
Eosinophils Relative: 0 %
HCT: 39.7 % (ref 39.0–52.0)
Hemoglobin: 13.3 g/dL (ref 13.0–17.0)
Immature Granulocytes: 1 %
Lymphocytes Relative: 16 %
Lymphs Abs: 1.5 10*3/uL (ref 0.7–4.0)
MCH: 28.1 pg (ref 26.0–34.0)
MCHC: 33.5 g/dL (ref 30.0–36.0)
MCV: 83.9 fL (ref 80.0–100.0)
Monocytes Absolute: 0.6 10*3/uL (ref 0.1–1.0)
Monocytes Relative: 7 %
Neutro Abs: 7.2 10*3/uL (ref 1.7–7.7)
Neutrophils Relative %: 75 %
Platelets: 272 10*3/uL (ref 150–400)
RBC: 4.73 MIL/uL (ref 4.22–5.81)
RDW: 15.2 % (ref 11.5–15.5)
WBC: 9.6 10*3/uL (ref 4.0–10.5)
nRBC: 0 % (ref 0.0–0.2)

## 2023-09-06 LAB — TROPONIN I (HIGH SENSITIVITY)
Troponin I (High Sensitivity): 3 ng/L (ref <18)
Troponin I (High Sensitivity): 3 ng/L (ref <18)

## 2023-09-06 LAB — LIPASE, BLOOD: Lipase: 28 U/L (ref 11–51)

## 2023-09-06 LAB — PROTIME-INR
INR: 1 (ref 0.8–1.2)
Prothrombin Time: 13.3 s (ref 11.4–15.2)

## 2023-09-06 LAB — BRAIN NATRIURETIC PEPTIDE: B Natriuretic Peptide: 25.4 pg/mL (ref 0.0–100.0)

## 2023-09-06 LAB — LACTIC ACID, PLASMA: Lactic Acid, Venous: 1 mmol/L (ref 0.5–1.9)

## 2023-09-06 LAB — AMMONIA: Ammonia: 23 umol/L (ref 9–35)

## 2023-09-06 LAB — TSH: TSH: 1.291 u[IU]/mL (ref 0.350–4.500)

## 2023-09-06 MED ORDER — IOHEXOL 350 MG/ML SOLN
100.0000 mL | Freq: Once | INTRAVENOUS | Status: AC | PRN
  Administered 2023-09-06: 100 mL via INTRAVENOUS

## 2023-09-06 MED ORDER — FENTANYL CITRATE PF 50 MCG/ML IJ SOSY
50.0000 ug | PREFILLED_SYRINGE | Freq: Once | INTRAMUSCULAR | Status: AC
  Administered 2023-09-06: 50 ug via INTRAVENOUS
  Filled 2023-09-06: qty 1

## 2023-09-06 MED ORDER — GADOBUTROL 1 MMOL/ML IV SOLN
7.5000 mL | Freq: Once | INTRAVENOUS | Status: AC | PRN
  Administered 2023-09-06: 7.5 mL via INTRAVENOUS

## 2023-09-06 MED ORDER — OXYCODONE-ACETAMINOPHEN 5-325 MG PO TABS
1.0000 | ORAL_TABLET | Freq: Once | ORAL | Status: AC
  Administered 2023-09-06: 1 via ORAL
  Filled 2023-09-06: qty 1

## 2023-09-06 NOTE — Discharge Instructions (Signed)
 Please follow-up with your primary doctor peer return immediately for fevers, chills, sudden onset headache, facial droop, chest pain, shortness of breath, abdominal pain, inability eat or drink due to nausea vomiting or any new or worsening symptoms that are concerning to you.

## 2023-09-06 NOTE — ED Provider Notes (Signed)
 [place] EMERGENCY DEPARTMENT AT [facility] Provider Note   CSN: [id number] Arrival date & time: 09/06/23  1247     History  No chief complaint on file.   Lee Mitchell is a 73 y.o. male.  The history is provided by the patient and medical records. No language interpreter was used.  Abdominal Pain Pain location:  RLQ and R flank Pain quality: aching   Pain radiates to:  Does not radiate Pain severity:  Severe Onset quality:  Gradual Duration:  2 weeks Timing:  Intermittent Progression:  Waxing and waning Chronicity:  New Context: not sick contacts and not trauma   Relieved by:  Nothing Worsened by:  Nothing Associated symptoms: chest pain, chills, cough, dysuria, fatigue and shortness of breath   Associated symptoms: no constipation, no diarrhea, no fever, no nausea and no vomiting   Chest Pain Pain location:  L chest Pain quality: aching   Pain radiates to:  Does not radiate Pain severity:  Moderate Onset quality:  Gradual Duration:  3 days Timing:  Sporadic Progression:  Waxing and waning Relieved by:  Nothing Worsened by:  Coughing Ineffective treatments:  None tried Associated symptoms: abdominal pain, cough, fatigue and shortness of breath   Associated symptoms: no back pain, no fever, no headache, no nausea, no palpitations, no vomiting and no weakness   Risk factors: prior DVT/PE   Neurologic Problem This is a new problem. The problem has not changed since onset.Associated symptoms include chest pain, abdominal pain and shortness of breath. Pertinent negatives include no headaches. Nothing relieves the symptoms. He has tried nothing for the symptoms. The treatment provided no relief.       Home Medications Prior to Admission medications   Medication Sig Start Date End Date Taking? Authorizing Provider  albuterol  (VENTOLIN  HFA) 108 (90 Base) MCG/ACT inhaler Inhale 1-2 puffs into the lungs every 6 (six) hours as needed for wheezing or  shortness of breath. 08/17/23   Arcadio Knuckles, MD  diclofenac  Sodium (VOLTAREN ) 1 % GEL Apply 2 g topically 4 (four) times daily. Rub into affected area of foot 2 to 4 times daily 02/16/21   Charity Conch, DPM  ezetimibe  (ZETIA ) 10 MG tablet Take 1 tablet (10 mg total) by mouth daily. 07/27/23   Arcadio Knuckles, MD  Fluticasone-Umeclidin-Vilant (TRELEGY ELLIPTA ) 100-62.5-25 MCG/ACT AEPB Inhale 1 puff into the lungs daily. 08/17/23   Arcadio Knuckles, MD  levothyroxine  (SYNTHROID ) 150 MCG tablet Take 1 tablet (150 mcg total) by mouth daily before breakfast. 08/17/23   Arcadio Knuckles, MD  lubiprostone  (AMITIZA ) 24 MCG capsule Take 1 capsule (24 mcg total) by mouth 2 (two) times daily with a meal. 09/20/22   Hershel Los F, FNP  metFORMIN  (GLUCOPHAGE -XR) 500 MG 24 hr tablet TAKE 1 TABLET BY MOUTH EVERY DAY WITH BREAKFAST 06/23/23   Roslyn Coombe, MD  metoprolol  tartrate (LOPRESSOR ) 100 MG tablet Take tablet (100mg ) TWO hours prior to your cardiac CT scan. 09/02/23   Acharya, Gayatri A, MD  mirtazapine  (REMERON ) 30 MG tablet Take 1 tablet (30 mg total) by mouth at bedtime. 07/27/23   Arcadio Knuckles, MD  omeprazole  (PRILOSEC) 40 MG capsule Take 1 capsule (40 mg total) by mouth daily. 07/27/23   Arcadio Knuckles, MD  oxyCODONE  ER (XTAMPZA  ER) 9 MG C12A Take 1 capsule by mouth 2 (two) times daily as needed. 08/17/23   Arcadio Knuckles, MD  rosuvastatin  (CRESTOR ) 40 MG tablet Take 1 tablet (40  mg total) by mouth daily. 07/27/23   Arcadio Knuckles, MD  tamsulosin  (FLOMAX ) 0.4 MG CAPS capsule Take 1 capsule (0.4 mg total) by mouth daily. 07/27/23   Arcadio Knuckles, MD      Allergies    Patient has no known allergies.    Review of Systems   Review of Systems  Constitutional:  Positive for chills and fatigue. Negative for fever.  HENT:  Negative for congestion.   Eyes:  Negative for visual disturbance.  Respiratory:  Positive for cough, chest tightness and shortness of breath. Negative for wheezing.    Cardiovascular:  Positive for chest pain. Negative for palpitations and leg swelling.  Gastrointestinal:  Positive for abdominal pain. Negative for constipation, diarrhea, nausea and vomiting.  Genitourinary:  Positive for dysuria and flank pain. Negative for frequency.  Musculoskeletal:  Negative for back pain, neck pain and neck stiffness.  Skin:  Negative for rash and wound.  Neurological:  Positive for speech difficulty (intermitent this last week, not now). Negative for weakness, light-headedness and headaches.  Psychiatric/Behavioral:  Negative for agitation and confusion.   All other systems reviewed and are negative.   Physical Exam Updated Vital Signs There were no vitals taken for this visit. Physical Exam Vitals and nursing note reviewed.  Constitutional:      General: He is not in acute distress.    Appearance: He is well-developed. He is not ill-appearing, toxic-appearing or diaphoretic.  HENT:     Head: Normocephalic and atraumatic.     Mouth/Throat:     Pharynx: No pharyngeal swelling or oropharyngeal exudate.  Eyes:     Extraocular Movements: Extraocular movements intact.     Conjunctiva/sclera: Conjunctivae normal.     Pupils: Pupils are equal, round, and reactive to light.  Cardiovascular:     Rate and Rhythm: Normal rate and regular rhythm.     Heart sounds: No murmur heard. Pulmonary:     Effort: Pulmonary effort is normal. No respiratory distress.     Breath sounds: Rales present. No rhonchi.  Chest:     Chest wall: No tenderness.  Abdominal:     Palpations: Abdomen is soft.     Tenderness: There is abdominal tenderness.  Musculoskeletal:        General: No swelling.     Cervical back: Neck supple.  Skin:    General: Skin is warm and dry.     Capillary Refill: Capillary refill takes less than 2 seconds.     Findings: No erythema.  Neurological:     General: No focal deficit present.     Mental Status: He is alert.  Psychiatric:        Mood and  Affect: Mood normal.     ED Results / Procedures / Treatments   Labs (all labs ordered are listed, but only abnormal results are displayed) Labs Reviewed  COMPREHENSIVE METABOLIC PANEL WITH GFR - Abnormal; Notable for the following components:      Result Value   CO2 21 (*)    Glucose, Bld 103 (*)    All other components within normal limits  URINE CULTURE  LIPASE, BLOOD  LACTIC ACID, PLASMA  PROTIME-INR  AMMONIA  CBC WITH DIFFERENTIAL/PLATELET  LACTIC ACID, PLASMA  TSH  URINALYSIS, W/ REFLEX TO CULTURE (INFECTION SUSPECTED)  BRAIN NATRIURETIC PEPTIDE  CBC WITH DIFFERENTIAL/PLATELET  TROPONIN I (HIGH SENSITIVITY)  TROPONIN I (HIGH SENSITIVITY)    EKG None  Radiology CT CORONARY MORPH W/CTA COR W/SCORE W/CA W/CM &/OR WO/CM Result  Date: 09/06/2023 CLINICAL DATA:  58M with hyperlipidemia and chest pain. EXAM: Cardiac/Coronary  CT TECHNIQUE: The patient was scanned on a GE Apex scanner. PROTOCOL: A non-contrast, gated CT scan was obtained with axial slices of 2.5 mm through the heart for calcium  scoring. Calcium  scoring was performed using the Agatston method. A 120 kV prospective, gated, contrast cardiac CT scan was obtained. Gantry rotation speed was 230 msec and collimation was 0.63 mm. Two sublingual nitroglycerin  tablets (0.8 mg) were given. The 3D data set was reconstructed with motion correction for the best systolic or diastolic phase. Images were analyzed on a dedicated workstation using MPR, MIP, and VRT modes. The patient received 95 cc of contrast. FINDINGS: Aorta: Normal size.  No calcifications.  No dissection. Aortic Valve:  Trileaflet.  No calcifications. Coronary Arteries:  Normal coronary origin.  Right dominance. RCA is a large co-dominant artery that gives rise to PDA and PLVB. There is no plaque. Left main is a large artery that gives rise to LAD, RI, and LCX arteries. LAD is a large vessel that has minimal (<25%) calcified plaque proximally. There are two diagonal  vessels without plaque. RI is a small vessel without plaque. LCX is a co-dominant artery that gives rise to one large OM1 branch and L-PDA. There is no plaque. Coronary Calcium  Score: Left main: 0 Left anterior descending artery: 10.2 Left circumflex artery: 0 Right coronary artery: 1.78 Total: 12 Percentile: 21st Other findings: Normal pulmonary vein drainage into the left atrium. Normal let atrial appendage without a thrombus. Normal size of the pulmonary artery. Non-cardiac: See separate report from [facility]. IMPRESSION: 1. Coronary calcium  score of 12. This was 21st percentile for age-, sex, and race-matched controls. 2. Total plaque volume 83 mm3 which is 11th percentile for age- and sex-matched controls (calcified plaque 1 mm3; non-calcified plaque 82 mm3). TPV is mild. 3. Normal coronary origin with co-dominance. 4.  There is minimal (<25%) plaque in the LAD.  CAD-RADS 1. RECOMMENDATIONS: 1. CAD-RADS 0: No evidence of CAD (0%). Consider non-atherosclerotic causes of chest pain. 2. CAD-RADS 1: Minimal non-obstructive CAD (0-24%). Consider non-atherosclerotic causes of chest pain. Consider preventive therapy and risk factor modification. 3. CAD-RADS 2: Mild non-obstructive CAD (25-49%). Consider non-atherosclerotic causes of chest pain. Consider preventive therapy and risk factor modification. 4. CAD-RADS 3: Moderate stenosis. Consider symptom-guided anti-ischemic pharmacotherapy as well as risk factor modification per guideline directed care. Additional analysis with CT FFR will be submitted. 5. CAD-RADS 4: Severe stenosis. (70-99% or > 50% left main). Cardiac catheterization or CT FFR is recommended. Consider symptom-guided anti-ischemic pharmacotherapy as well as risk factor modification per guideline directed care. Invasive coronary angiography recommended with revascularization per published guideline statements. 6. CAD-RADS 5: Total coronary occlusion (100%). Consider cardiac catheterization  or viability assessment. Consider symptom-guided anti-ischemic pharmacotherapy as well as risk factor modification per guideline directed care. 7. CAD-RADS N: Non-diagnostic study. Obstructive CAD can't be excluded. Alternative evaluation is recommended. Maudine Sos, MD Electronically Signed   By: Maudine Sos M.D.   On: 09/06/2023 11:45    Procedures Procedures    Medications Ordered in ED Medications - No data to display  ED Course/ Medical Decision Making/ A&P                                 Medical Decision Making Amount and/or Complexity of Data Reviewed Labs: ordered. Radiology: ordered.  Risk Prescription drug management.    Lee Mitchell  Lee Mitchell is a 73 y.o. male with a past medical history significant for diabetes, depression, COPD/emphysema, CAD, hyperlipidemia, remote history of kidney stones, GERD, diabetes, anxiety, and patient report of previous DVT not on blood thinners now who presents with several days of worsening exertional shortness of breath, intermittent left chest pain, and right lower quadrant abdominal pain.  According to patient, he has had worsening shortness of breath and fatigue and no energy for the last few days.  He reports that when he walks outside he gets winded and has to stop.  This is relatively new over the last few weeks but have worsened here more recently.  He reports some left upper chest pain with coughing.  He reports he had a coronary CT yesterday that said his heart did not have any blockages but it was not able to look for blood clots that is one of the patient's concerns.  He denies any new leg pain or leg swelling but is concerned as he has had blood clots before.  He reports no trauma.  He denies fevers but does have occasional chills.  He is reported some weight loss and is also had this right lower quadrant and lower abdominal pain for the last "wild".  He reports intermittent dysuria.  Denies this feeling like kidney stones but it  does go to his right flank.  He reports no bowel changes with constipation or diarrhea at this time.  On exam, lungs had some rales in the left lower base.  Mild rhonchi as well.  Chest nontender.  Abdomen is tender primary in the right lower quadrant and right flank.  Bowel sounds cruciated.  Legs had intact pulses but no tenderness or swelling seen on exam.  Upper extremities have pulses symmetric as well.  Patient otherwise well-appearing.  Given the patient's concern for blood clot in this chest pain with coughing and shortness of breath with exertion, I called radiology to take a look at the CT yesterday to see if they could see the lungs well.  They report that they cannot see lungs adequate to rule out pulmonary embolism and recommended repeat CT.  Will get a CT PE study and will get a CT on pelvis given the right lower quadrant pain as well.  Patient reports his PSA has been rising and he is concerned about prostate cancer or other abdominal problem.  He will get CT ab pelvis and will get screening labs and urinalysis as well.  Will give some pain medicine.  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  He reports he has not changed his thyroid  medication recently but is very tired and fatigued.  Patient also reports under assessment that he is having waxing and difficulty with speech for the last week or so with not understanding what he had written to people and difficulty getting words out that he wanted to say at times.  Will add on MRI and due to his cancer history we will get with and without contrast.  Will also look for stroke.    Anticipate reassessment after workup to determine disposition.  4:33 PM Care transferred oncoming team to wait for workup results and reassessment.  Anticipate discharge if workup reassuring and patient feeling better.         Final Clinical Impression(s) / ED Diagnoses Final diagnoses:  SOB (shortness of breath)  Chest pain, unspecified  type  Abdominal pain, unspecified abdominal location  Transient speech disturbance   Clinical Impression: 1. SOB (  shortness of breath)   2. Chest pain, unspecified type   3. Abdominal pain, unspecified abdominal location   4. Transient speech disturbance     Disposition: Care transferred oncoming team to wait for workup results and reassessment.  Anticipate discharge if workup reassuring and patient feeling better.  This note was prepared with assistance of Conservation officer, historic buildings. Occasional wrong-word or sound-a-like substitutions may have occurred due to the inherent limitations of voice recognition software.      Mayme Profeta, Marine Sia, MD 09/06/23 [phone]

## 2023-09-06 NOTE — ED Provider Notes (Signed)
 Received signout from Dr. Manus Sellers pending official reads and repeat troponin.  Scans reviewed; no acute pathology.  Repeat troponins negative.  No infectious process identified.  No metabolic derangements.  Patient is requesting to leave.  Given negative workup, stable vitals will discharge in stable condition.   Rolinda Climes, DO 09/06/23 1944

## 2023-09-06 NOTE — ED Triage Notes (Signed)
 Pt [facility]A from [facility] for increasing SHOB x3 days esp on exertion, using inhaler with no relief. Wheezes in lower fields. No lung hx, smokes 3-4 cigs/day  134/82 Hr 74 Cbg 124

## 2023-09-08 LAB — URINE CULTURE: Culture: NO GROWTH

## 2023-09-11 ENCOUNTER — Ambulatory Visit: Payer: Self-pay | Admitting: Internal Medicine

## 2023-10-19 ENCOUNTER — Other Ambulatory Visit: Payer: Self-pay | Admitting: Internal Medicine

## 2023-10-19 DIAGNOSIS — F332 Major depressive disorder, recurrent severe without psychotic features: Secondary | ICD-10-CM

## 2023-10-24 ENCOUNTER — Other Ambulatory Visit: Payer: Self-pay | Admitting: Internal Medicine

## 2023-10-24 DIAGNOSIS — E785 Hyperlipidemia, unspecified: Secondary | ICD-10-CM

## 2023-10-24 DIAGNOSIS — K219 Gastro-esophageal reflux disease without esophagitis: Secondary | ICD-10-CM

## 2023-10-24 DIAGNOSIS — I2510 Atherosclerotic heart disease of native coronary artery without angina pectoris: Secondary | ICD-10-CM

## 2023-10-24 NOTE — Telephone Encounter (Unsigned)
 Copied from CRM [id number]. Topic: Clinical - Medication Refill >> Oct 24, 2023  4:14 PM Winona R wrote: Medication: metFORMIN  (GLUCOPHAGE -XR) 500 MG 24 hr tablet [[id number]]  Has the patient contacted their pharmacy? No pharmacy  (Agent: If no, request that the patient contact the pharmacy for the refill. If patient does not wish to contact the pharmacy document the reason why and proceed with request.) (Agent: If yes, when and what did the pharmacy advise?)  This is the patient's preferred pharmacy:  [id number] [place], [place] - [address] AT [address] [place] [address] Phone: [phone] Fax: [phone]  Is this the correct pharmacy for this prescription? Yes If no, delete pharmacy and type the correct one.   Has the prescription been filled recently? No  Is the patient out of the medication? yes  Has the patient been seen for an appointment in the last year OR does the patient have an upcoming appointment? Yes  Can we respond through MyChart? Yes  Agent: Please be advised that Rx refills may take up to 3 business days. We ask that you follow-up with your pharmacy.

## 2023-10-26 MED ORDER — METFORMIN HCL ER 500 MG PO TB24: 500.0000 mg | ORAL_TABLET | Freq: Every day | ORAL | 0 refills | Status: DC

## 2023-10-27 ENCOUNTER — Ambulatory Visit: Admitting: Internal Medicine

## 2023-10-27 ENCOUNTER — Encounter: Payer: Self-pay | Admitting: Internal Medicine

## 2023-10-27 VITALS — BP 130/72 | HR 95 | Temp 98.2°F | Resp 16 | Ht 74.0 in | Wt 157.4 lb

## 2023-10-27 DIAGNOSIS — E118 Type 2 diabetes mellitus with unspecified complications: Secondary | ICD-10-CM

## 2023-10-27 DIAGNOSIS — L2084 Intrinsic (allergic) eczema: Secondary | ICD-10-CM | POA: Diagnosis not present

## 2023-10-27 DIAGNOSIS — I10 Essential (primary) hypertension: Secondary | ICD-10-CM

## 2023-10-27 DIAGNOSIS — Z23 Encounter for immunization: Secondary | ICD-10-CM

## 2023-10-27 MED ORDER — SHINGRIX 50 MCG/0.5ML IM SUSR: 0.5000 mL | Freq: Once | INTRAMUSCULAR | 1 refills | Status: AC

## 2023-10-27 NOTE — Patient Instructions (Signed)

## 2023-10-27 NOTE — Progress Notes (Addendum)
 Subjective:  Patient ID: Lee Mitchell, male    DOB: 1950-11-23  Age: 73 y.o. MRN: [id number]  CC: Rash (Irritated and very red ), Hypertension, Hypothyroidism, Diabetes, and Hyperlipidemia   HPI Lee Mitchell presents for f/up ----  Discussed the use of AI scribe software for clinical note transcription with the patient, who gave verbal consent to proceed.  History of Present Illness   Lee Mitchell is a 73 year old male who presents with confusion and pain management issues.  Lee Mitchell tried kratom, a substance recommended by a friend, as a substitute for opiates. After discontinuing it, Lee Mitchell felt withdrawal symptoms, including insomnia and feeling 'strange'. Lee Mitchell has decided not to use kratom again due to these effects.  Lee Mitchell continues to experience significant pain in his hip, lower back, and groin, describing it as 'kicking my ass'. Lee Mitchell prefers 5 mg oxycodone  over extended-release formulations due to cost and effectiveness, as the extended-release medication did not provide adequate relief and was too expensive. Lee Mitchell takes pain medication intermittently, depending on the severity of his pain.  Lee Mitchell recounts a recent episode where Lee Mitchell ended up in the emergency room due to breathing difficulties. During a walk to a coffee shop, his breathing worsened, leading to an ambulance being called. The hospital did not find anything significant, although there was a mention of a blood test related to 'heart stuff'. Lee Mitchell continues to experience shortness of breath, particularly in the mornings and during activities like showering, but it tends to improve as the day progresses.  No weakness, dizziness, or lightheadedness. No chest pain, nausea, vomiting, diarrhea, or constipation. No swelling in his legs or feet.       Outpatient Medications Prior to Visit  Medication Sig Dispense Refill   albuterol  (VENTOLIN  HFA) 108 (90 Base) MCG/ACT inhaler Inhale 1-2 puffs into the lungs every 6 (six)  hours as needed for wheezing or shortness of breath. 6.7 each 5   diclofenac  Sodium (VOLTAREN ) 1 % GEL Apply 2 g topically 4 (four) times daily. Rub into affected area of foot 2 to 4 times daily 100 g 2   ezetimibe  (ZETIA ) 10 MG tablet TAKE 1 TABLET BY MOUTH EVERY DAY 90 tablet 0   Fluticasone-Umeclidin-Vilant (TRELEGY ELLIPTA ) 100-62.5-25 MCG/ACT AEPB Inhale 1 puff into the lungs daily. 120 each 0   levothyroxine  (SYNTHROID ) 150 MCG tablet Take 1 tablet (150 mcg total) by mouth daily before breakfast. 90 tablet 0   metFORMIN  (GLUCOPHAGE -XR) 500 MG 24 hr tablet Take 1 tablet (500 mg total) by mouth daily with breakfast. 90 tablet 0   metoprolol  tartrate (LOPRESSOR ) 100 MG tablet Take tablet (100mg ) TWO hours prior to your cardiac CT scan. 1 tablet 0   mirtazapine  (REMERON ) 30 MG tablet TAKE 1 TABLET BY MOUTH AT BEDTIME. 90 tablet 0   omeprazole  (PRILOSEC) 40 MG capsule TAKE 1 CAPSULE (40 MG TOTAL) BY MOUTH DAILY. 90 capsule 0   rosuvastatin  (CRESTOR ) 40 MG tablet TAKE 1 TABLET BY MOUTH EVERY DAY 90 tablet 0   tamsulosin  (FLOMAX ) 0.4 MG CAPS capsule Take 1 capsule (0.4 mg total) by mouth daily. 90 capsule 0   oxyCODONE  ER (XTAMPZA  ER) 9 MG C12A Take 1 capsule by mouth 2 (two) times daily as needed. 60 capsule 0   No facility-administered medications prior to visit.    ROS Review of Systems  Constitutional:  Negative for appetite change, chills, diaphoresis, fatigue and fever.  HENT: Negative.    Eyes: Negative.  Respiratory:  Positive for shortness of breath. Negative for cough, chest tightness and wheezing.   Cardiovascular:  Negative for chest pain, palpitations and leg swelling.  Gastrointestinal: Negative.  Negative for abdominal pain, constipation, diarrhea, nausea and vomiting.  Endocrine: Negative.   Genitourinary: Negative.  Negative for difficulty urinating.  Musculoskeletal:  Positive for arthralgias and neck pain. Negative for myalgias.  Skin:  Positive for color change.  Negative for rash.  Neurological: Negative.  Negative for dizziness.  Hematological:  Negative for adenopathy. Does not bruise/bleed easily.  Psychiatric/Behavioral:  Negative for decreased concentration, dysphoric mood, sleep disturbance and suicidal ideas. The patient is nervous/anxious.     Objective:  BP 130/72 (BP Location: Left Arm, Patient Position: Sitting, Cuff Size: Normal)   Pulse 95   Temp 98.2 F (36.8 C) (Oral)   Resp 16   Ht 6' 2 (1.88 m)   Wt 157 lb 6.4 oz (71.4 kg)   SpO2 96%   BMI 20.21 kg/m   BP Readings from Last 3 Encounters:  10/27/23 130/72  09/06/23 121/81  09/05/23 92/70    Wt Readings from Last 3 Encounters:  10/27/23 157 lb 6.4 oz (71.4 kg)  08/17/23 165 lb 12.8 oz (75.2 kg)  02/22/23 165 lb 9.6 oz (75.1 kg)    Physical Exam Vitals reviewed.  Constitutional:      Appearance: Normal appearance.  HENT:     Nose: Nose normal.     Mouth/Throat:     Mouth: Mucous membranes are moist.  Eyes:     General: No scleral icterus.    Conjunctiva/sclera: Conjunctivae normal.  Cardiovascular:     Rate and Rhythm: Normal rate and regular rhythm.     Pulses: Normal pulses.     Heart sounds: No murmur heard.    No gallop.  Pulmonary:     Effort: Pulmonary effort is normal.     Breath sounds: No stridor. No wheezing, rhonchi or rales.  Abdominal:     General: Abdomen is flat.     Palpations: There is no mass.     Tenderness: There is no abdominal tenderness. There is no guarding.     Hernia: No hernia is present.  Musculoskeletal:        General: Normal range of motion.     Right lower leg: No edema.     Left lower leg: No edema.  Skin:    Coloration: Skin is not jaundiced.     Findings: Rash present. No erythema.      Neurological:     Mental Status: Lee Mitchell is alert.     Lab Results  Component Value Date   WBC 9.6 09/06/2023   HGB 13.3 09/06/2023   HCT 39.7 09/06/2023   PLT 272 09/06/2023   GLUCOSE 103 (H) 09/06/2023   CHOL 129  08/17/2023   TRIG 115.0 08/17/2023   HDL 48.90 08/17/2023   LDLCALC 57 08/17/2023   ALT 40 09/06/2023   AST 27 09/06/2023   NA 138 09/06/2023   K 3.9 09/06/2023   CL 107 09/06/2023   CREATININE 0.80 09/06/2023   BUN 14 09/06/2023   CO2 21 (L) 09/06/2023   TSH 1.291 09/06/2023   PSA 8.17 (H) 08/17/2023   INR 1.0 09/06/2023   HGBA1C 7.0 (H) 08/17/2023   MICROALBUR 3.9 (H) 08/17/2023    MR Brain W and Wo Contrast Result Date: 09/06/2023 CLINICAL DATA:  Neuro deficit, acute, stroke suspected. Speech difficulty. History of thyroid  cancer. EXAM: MRI HEAD WITHOUT AND WITH CONTRAST  TECHNIQUE: Multiplanar, multiecho pulse sequences of the brain and surrounding structures were obtained without and with intravenous contrast. CONTRAST:  7.5mL GADAVIST  GADOBUTROL  1 MMOL/ML IV SOLN COMPARISON:  Head MRI 07/11/2021 FINDINGS: Brain: There is no evidence of an acute infarct, intracranial hemorrhage, mass, midline shift, or extra-axial fluid collection. Scattered small T2 hyperintensities in the cerebral white matter bilaterally are unchanged and nonspecific but compatible with mild chronic small vessel ischemic disease. 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 No abnormal enhancement is identified. There is mild generalized cerebral atrophy. Vascular: Major intracranial vascular flow voids are preserved. Skull and upper cervical spine: No suspicious marrow lesion. Sinuses/Orbits: Unremarkable orbits. Paranasal sinuses and mastoid air cells are largely clear. Other: None. IMPRESSION: 1. No acute intracranial abnormality or evidence of metastatic disease. 2. Mild chronic small vessel ischemic disease. Electronically Signed   By: Dasie Hamburg M.D.   On: 09/06/2023 16:24   CT Angio Chest PE W and/or Wo Contrast Result Date: 09/06/2023 CLINICAL DATA:  Pulmonary embolism (PE) suspected, high prob; RLQ abdominal pain. Of EXAM: CT ANGIOGRAPHY CHEST CT ABDOMEN AND  PELVIS WITH CONTRAST TECHNIQUE: Multidetector CT imaging of the chest was performed using the standard protocol during bolus administration of intravenous contrast. Multiplanar CT image reconstructions and MIPs were obtained to evaluate the vascular anatomy. Multidetector CT imaging of the abdomen and pelvis was performed using the standard protocol during bolus administration of intravenous contrast. RADIATION DOSE REDUCTION: This exam was performed according to the departmental dose-optimization program which includes automated exposure control, adjustment of the mA and/or kV according to patient size and/or use of iterative reconstruction technique. CONTRAST:  OMNIPAQUE  IOHEXOL  350 MG/ML SOLN COMPARISON:  CT scan abdomen and pelvis from 12/19/2020 and CT angiography chest from 12/29/2021. FINDINGS: CTA CHEST FINDINGS Cardiovascular: No evidence of embolism to the proximal subsegmental pulmonary artery level. Normal cardiac size. No pericardial effusion. No aortic aneurysm. Mediastinum/Nodes: Surgically absent versus diminutive thyroid  gland. There are surgical staples in the right thyroid  bed. No solid / cystic mediastinal masses. The esophagus is nondistended precluding optimal assessment. No axillary, mediastinal or hilar lymphadenopathy by size criteria. Lungs/Pleura: The central tracheo-bronchial tree is patent. There is mild, smooth, circumferential thickening of the segmental and subsegmental bronchial walls, throughout bilateral lungs, which is nonspecific. Findings are most commonly seen with bronchitis or reactive airway disease, such as asthma. Mild upper lobe predominant centrilobular emphysematous changes noted. There are patchy areas of linear, plate-like atelectasis and/or scarring throughout bilateral lungs. No mass or consolidation. No pleural effusion or pneumothorax. No suspicious lung nodules. Musculoskeletal: The visualized soft tissues of the chest wall are grossly unremarkable. No  suspicious osseous lesions. There are mild multilevel degenerative changes in the visualized spine. Review of the MIP images confirms the above findings. CT ABDOMEN and PELVIS FINDINGS Hepatobiliary: The liver is normal in size. Non-cirrhotic configuration. No suspicious mass. There are at least 6 hemangiomas in the liver with largest in the right hepatic lobe, predominantly in segment 7/8. These are grossly unchanged since the prior study. No intrahepatic or extrahepatic bile duct dilation. Gallbladder is surgically absent. Pancreas: Unremarkable. No pancreatic ductal dilatation or surrounding inflammatory changes. Spleen: Within normal limits. No focal lesion. Adrenals/Urinary Tract: Adrenal glands are unremarkable. No suspicious renal mass. There are multiple cysts throughout bilateral kidneys with largest arising from the left kidney upper pole, anteromedially measuring up to 1.4 x 1.5 cm. No hydroureteronephrosis. There is excreted contrast in bilateral renal collecting  systems, limiting the evaluation for nonocclusive calculi. Urinary bladder is under distended, precluding optimal assessment. However, no large mass or stones identified. No perivesical fat stranding. Stomach/Bowel: No disproportionate dilation of the small or large bowel loops. No evidence of abnormal bowel wall thickening or inflammatory changes. The appendix is unremarkable. There are multiple diverticula mainly in the sigmoid colon, without imaging signs of diverticulitis. Vascular/Lymphatic: No ascites or pneumoperitoneum. No abdominal or pelvic lymphadenopathy, by size criteria. No aneurysmal dilation of the major abdominal arteries. There are mild peripheral atherosclerotic vascular calcifications of the aorta and its major branches. Reproductive: Enlarged prostate. Symmetric seminal vesicles. Other: There are fat containing umbilical and bilateral inguinal hernias. The soft tissues and abdominal wall are otherwise unremarkable.  Musculoskeletal: No suspicious osseous lesions. There are mild - moderate multilevel degenerative changes in the visualized spine. Note is made of L3 through S1 spinal fixation. Review of the MIP images confirms the above findings. IMPRESSION: 1. No embolism to the proximal subsegmental pulmonary artery level. 2. No acute inflammatory process identified within the abdomen or pelvis. 3. Multiple other nonacute observations, as described above. Aortic Atherosclerosis (ICD10-I70.0) and Emphysema (ICD10-J43.9). Electronically Signed   By: Ree Molt M.D.   On: 09/06/2023 16:22   CT ABDOMEN PELVIS W CONTRAST Result Date: 09/06/2023 CLINICAL DATA:  Pulmonary embolism (PE) suspected, high prob; RLQ abdominal pain. Of EXAM: CT ANGIOGRAPHY CHEST CT ABDOMEN AND PELVIS WITH CONTRAST TECHNIQUE: Multidetector CT imaging of the chest was performed using the standard protocol during bolus administration of intravenous contrast. Multiplanar CT image reconstructions and MIPs were obtained to evaluate the vascular anatomy. Multidetector CT imaging of the abdomen and pelvis was performed using the standard protocol during bolus administration of intravenous contrast. RADIATION DOSE REDUCTION: This exam was performed according to the departmental dose-optimization program which includes automated exposure control, adjustment of the mA and/or kV according to patient size and/or use of iterative reconstruction technique. CONTRAST:  OMNIPAQUE  IOHEXOL  350 MG/ML SOLN COMPARISON:  CT scan abdomen and pelvis from 12/19/2020 and CT angiography chest from 12/29/2021. FINDINGS: CTA CHEST FINDINGS Cardiovascular: No evidence of embolism to the proximal subsegmental pulmonary artery level. Normal cardiac size. No pericardial effusion. No aortic aneurysm. Mediastinum/Nodes: Surgically absent versus diminutive thyroid  gland. There are surgical staples in the right thyroid  bed. No solid / cystic mediastinal masses. The esophagus is  nondistended precluding optimal assessment. No axillary, mediastinal or hilar lymphadenopathy by size criteria. Lungs/Pleura: The central tracheo-bronchial tree is patent. There is mild, smooth, circumferential thickening of the segmental and subsegmental bronchial walls, throughout bilateral lungs, which is nonspecific. Findings are most commonly seen with bronchitis or reactive airway disease, such as asthma. Mild upper lobe predominant centrilobular emphysematous changes noted. There are patchy areas of linear, plate-like atelectasis and/or scarring throughout bilateral lungs. No mass or consolidation. No pleural effusion or pneumothorax. No suspicious lung nodules. Musculoskeletal: The visualized soft tissues of the chest wall are grossly unremarkable. No suspicious osseous lesions. There are mild multilevel degenerative changes in the visualized spine. Review of the MIP images confirms the above findings. CT ABDOMEN and PELVIS FINDINGS Hepatobiliary: The liver is normal in size. Non-cirrhotic configuration. No suspicious mass. There are at least 6 hemangiomas in the liver with largest in the right hepatic lobe, predominantly in segment 7/8. These are grossly unchanged since the prior study. No intrahepatic or extrahepatic bile duct dilation. Gallbladder is surgically absent. Pancreas: Unremarkable. No pancreatic ductal dilatation or surrounding inflammatory changes. Spleen: Within normal limits. No focal lesion. Adrenals/Urinary  Tract: Adrenal glands are unremarkable. No suspicious renal mass. There are multiple cysts throughout bilateral kidneys with largest arising from the left kidney upper pole, anteromedially measuring up to 1.4 x 1.5 cm. No hydroureteronephrosis. There is excreted contrast in bilateral renal collecting systems, limiting the evaluation for nonocclusive calculi. Urinary bladder is under distended, precluding optimal assessment. However, no large mass or stones identified. No perivesical fat  stranding. Stomach/Bowel: No disproportionate dilation of the small or large bowel loops. No evidence of abnormal bowel wall thickening or inflammatory changes. The appendix is unremarkable. There are multiple diverticula mainly in the sigmoid colon, without imaging signs of diverticulitis. Vascular/Lymphatic: No ascites or pneumoperitoneum. No abdominal or pelvic lymphadenopathy, by size criteria. No aneurysmal dilation of the major abdominal arteries. There are mild peripheral atherosclerotic vascular calcifications of the aorta and its major branches. Reproductive: Enlarged prostate. Symmetric seminal vesicles. Other: There are fat containing umbilical and bilateral inguinal hernias. The soft tissues and abdominal wall are otherwise unremarkable. Musculoskeletal: No suspicious osseous lesions. There are mild - moderate multilevel degenerative changes in the visualized spine. Note is made of L3 through S1 spinal fixation. Review of the MIP images confirms the above findings. IMPRESSION: 1. No embolism to the proximal subsegmental pulmonary artery level. 2. No acute inflammatory process identified within the abdomen or pelvis. 3. Multiple other nonacute observations, as described above. Aortic Atherosclerosis (ICD10-I70.0) and Emphysema (ICD10-J43.9). Electronically Signed   By: Ree Molt M.D.   On: 09/06/2023 16:22    Assessment & Plan:  Need for prophylactic vaccination and inoculation against varicella -     Shingrix ; Inject 0.5 mLs into the muscle once for 1 dose.  Dispense: 0.5 mL; Refill: 1  Type II diabetes mellitus with manifestations ([name])- Blood sugar has been well controlled.  Primary hypertension- BP is well controlled.  Intrinsic eczema- Reassurance offered.     Follow-up: Return in about 3 months (around 01/27/2024).  Debby Molt, MD

## 2023-10-28 ENCOUNTER — Encounter: Payer: Self-pay | Admitting: Internal Medicine

## 2023-10-31 ENCOUNTER — Telehealth: Payer: Self-pay | Admitting: Internal Medicine

## 2023-10-31 NOTE — Telephone Encounter (Unsigned)
 Copied from CRM [id number]. Topic: Clinical - Medication Refill >> Oct 31, 2023 11:18 AM Lavanda D wrote: Medication: oxyCODONE  (OXY IR/ROXICODONE ) 5 MG immediate release tablet [[id number]] ENDED, not on current medications list - patient requesting to get it today   Has the patient contacted their pharmacy? Yes, told to reach out to pharmacy (Agent: If no, request that the patient contact the pharmacy for the refill. If patient does not wish to contact the pharmacy document the reason why and proceed with request.) (Agent: If yes, when and what did the pharmacy advise?)  This is the patient's preferred pharmacy:  [id number] [place], [place] - [address] AT [facility] [address] [place] [address] Phone: [phone] Fax: [phone]  Is this the correct pharmacy for this prescription? Yes If no, delete pharmacy and type the correct one.   Has the prescription been filled recently? No  Is the patient out of the medication? Yes  Has the patient been seen for an appointment in the last year OR does the patient have an upcoming appointment? Yes  Can we respond through MyChart? Yes  Agent: Please be advised that Rx refills may take up to 3 business days. We ask that you follow-up with your pharmacy.

## 2023-10-31 NOTE — Telephone Encounter (Signed)
 Not on current med list, last fill 08/18/22

## 2023-11-03 ENCOUNTER — Encounter: Payer: Self-pay | Admitting: Internal Medicine

## 2023-11-03 ENCOUNTER — Ambulatory Visit: Payer: Self-pay

## 2023-11-03 NOTE — Telephone Encounter (Signed)
 Copied from CRM [id number]. Topic: Clinical - Medication Refill >> Oct 31, 2023 11:18 AM Lavanda D wrote: Medication: oxyCODONE  (OXY IR/ROXICODONE ) 5 MG immediate release tablet [[id number]] ENDED, not on current medications list - patient requesting to get it today   Has the patient contacted their pharmacy? Yes, told to reach out to pharmacy (Agent: If no, request that the patient contact the pharmacy for the refill. If patient does not wish to contact the pharmacy document the reason why and proceed with request.) (Agent: If yes, when and what did the pharmacy advise?)  This is the patient's preferred pharmacy:  [id number] [place], [place] - [address] [facility] [address] [place] [address] Phone: [phone] Fax: [phone]  Is this the correct pharmacy for this prescription? Yes If no, delete pharmacy and type the correct one.   Has the prescription been filled recently? No  Is the patient out of the medication? Yes  Has the patient been seen for an appointment in the last year OR does the patient have an upcoming appointment? Yes  Can we respond through MyChart? Yes  Agent: Please be advised that Rx refills may take up to 3 business days. We ask that you follow-up with your pharmacy. >> Nov 03, 2023 10:26 AM Rosina BIRCH wrote: Patient called stating he is checking on a medication refill CB [id number] >> Nov 01, 2023  9:38 AM Viola F wrote: Patient also says this was discuss at appointment on 10/27/23 >> Nov 01, 2023  9:37 AM Viola F wrote: Patient called to follow up this refill request, he says he has a 6 hour trip today and really need this medication - he would like for it to be done by 12pm today

## 2023-11-03 NOTE — Telephone Encounter (Signed)
  FYI Only or Action Required?: Action required: refill request  Patient was last seen in primary care on 10/27/2023 by Joshua Debby CROME, MD.  Called Nurse Triage reporting Back Pain.  Symptoms began several years ago.  Interventions attempted: Prescription medications: extampza and kratom. Would like new RX for oxycodone  5mg .  Symptoms are: gradually worsening.  Triage Disposition: See PCP Within 2 Weeks  Patient/caregiver understands and will follow disposition?: Unsure  Copied from CRM [id number]. Topic: Clinical - Red Word Triage >> Nov 03, 2023  4:31 PM Jasmin G wrote: Red Word that prompted transfer to Nurse Triage: Pt. has had many surgeries in the past and is currently experiencing severe in pain lower back to the point where is affecting his daily tasks, doctor recently prescribed Kratom to manage his pain but he does not like taking it due to the side effects, he tried to get oxycodone  but was denied by doctor (Refer to MyChart conversations on 7/14), pt. would like to get another medication to manage his intense pain. Reason for Disposition  Back pain present > 2 weeks  Answer Assessment - Initial Assessment Questions Xtampza  ER;   1. ONSET: When did the pain begin? (e.g., minutes, hours, days)     Years ago 2. LOCATION: Where does it hurt? (upper, mid or lower back)     Low back  3. SEVERITY: How bad is the pain?  (e.g., Scale 1-10; mild, moderate, or severe)     8/10 4. PATTERN: Is the pain constant? (e.g., yes, no; constant, intermittent)      Constant x 2 weeks 5. RADIATION: Does the pain shoot into your legs or somewhere else?     Right hip, leg, and groin 6. CAUSE:  What do you think is causing the back pain?      History of lumbar fusion 7. BACK OVERUSE:  Any recent lifting of heavy objects, strenuous work or exercise?     Patient currently moving from his home.  Unable to proceed due to pain 8. MEDICINES: What have you taken so far for the pain?  (e.g., nothing, acetaminophen , NSAIDS)     Xtampza  ER; Kratom, Oxycodone  5mg  is the only thing that helps    Patient disconnected call before completion  Protocols used: Back Pain-A-AH

## 2023-11-03 NOTE — Telephone Encounter (Signed)
 Patient requesting a refill. Medication is not on his med list. Just seen you 7/10. Please advise.

## 2023-11-04 NOTE — Telephone Encounter (Signed)
 Patient has been made aware and gave a verbal understanding.

## 2023-11-04 NOTE — Telephone Encounter (Signed)
 Patient has been made aware of Dr. Joshua comments about prescribing oxycodone . He gave a verbal understanding.

## 2023-12-15 DIAGNOSIS — R3912 Poor urinary stream: Secondary | ICD-10-CM | POA: Diagnosis not present

## 2023-12-21 ENCOUNTER — Ambulatory Visit (INDEPENDENT_AMBULATORY_CARE_PROVIDER_SITE_OTHER)

## 2023-12-21 VITALS — Ht 74.0 in | Wt 157.0 lb

## 2023-12-21 DIAGNOSIS — Z0000 Encounter for general adult medical examination without abnormal findings: Secondary | ICD-10-CM | POA: Diagnosis not present

## 2023-12-21 NOTE — Patient Instructions (Signed)
 Lee Mitchell , Thank you for taking time out of your busy schedule to complete your Annual Wellness Visit with me. I enjoyed our conversation and look forward to speaking with you again next year. I, as well as your care team,  appreciate your ongoing commitment to your health goals. Please review the following plan we discussed and let me know if I can assist you in the future. Your Game plan/ To Do List    Follow up Visits: We will see or speak with you next year for your Next Medicare AWV with our clinical staff Have you seen your provider in the last 6 months (3 months if uncontrolled diabetes)? Yes.  Last office visit on 10/27/2023.  Next office visit on 01/23/2024.  Clinician Recommendations:  Aim for 30 minutes of exercise or brisk walking, 6-8 glasses of water , and 5 servings of fruits and vegetables each day. You are due for a 2nd shingles vaccine and a Flu vaccine.  You can get those done at the pharmacy or you can get your flu vaccine here during your office visit.  Remember, when you check out from your next visit, please stop at the front desk to schedule your physical and medicare wellness visit for next year.        This is a list of the screenings recommended for you:  Health Maintenance  Topic Date Due   Zoster (Shingles) Vaccine (2 of 2) 03/11/2022   Flu Shot  11/18/2023   Medicare Annual Wellness Visit  01/10/2024   Complete foot exam   12/29/2023   Eye exam for diabetics  02/15/2024   Hemoglobin A1C  02/16/2024   Yearly kidney health urinalysis for diabetes  08/16/2024   Yearly kidney function blood test for diabetes  09/05/2024   Cologuard (Stool DNA test)  12/08/2024   DTaP/Tdap/Td vaccine (3 - Td or Tdap) 07/05/2025   Pneumococcal Vaccine for age over 18  Completed   Hepatitis C Screening  Completed   HPV Vaccine  Aged Out   Meningitis B Vaccine  Aged Out   Colon Cancer Screening  Discontinued   COVID-19 Vaccine  Discontinued    Advanced directives: (Copy  Requested) Please bring a copy of your health care power of attorney and living will to the office to be added to your chart at your convenience. You can mail to [facility] [address] [place], [place] [address] or email to [email or web address] Advance Care Planning is important because it:  [x]  Makes sure you receive the medical care that is consistent with your values, goals, and preferences  [x]  It provides guidance to your family and loved ones and reduces their decisional burden about whether or not they are making the right decisions based on your wishes.  Follow the link provided in your after visit summary or read over the paperwork we have mailed to you to help you started getting your Advance Directives in place. If you need assistance in completing these, please reach out to us  so that we can help you!  See attachments for Preventive Care and Fall Prevention Tips.

## 2023-12-21 NOTE — Progress Notes (Signed)
 Subjective:   Lee Mitchell is a 73 y.o. who presents for a Medicare Wellness preventive visit.  As a reminder, Annual Wellness Visits don't include a physical exam, and some assessments may be limited, especially if this visit is performed virtually. We may recommend an in-person follow-up visit with your provider if needed.  Visit Complete: Virtual I connected with  Lee Mitchell on 12/21/23 by a audio enabled telemedicine application and verified that I am speaking with the correct person using two identifiers.  Patient Location: Home  Provider Location: Home Office  I discussed the limitations of evaluation and management by telemedicine. The patient expressed understanding and agreed to proceed.  Vital Signs: Because this visit was a virtual/telehealth visit, some criteria may be missing or patient reported. Any vitals not documented were not able to be obtained and vitals that have been documented are patient reported.  VideoDeclined- This patient declined Librarian, academic. Therefore the visit was completed with audio only.  Persons Participating in Visit: Patient.  AWV Questionnaire: Yes: Patient Medicare AWV questionnaire was completed by the patient on 12/18/2023; I have confirmed that all information answered by patient is correct and no changes since this date.  Cardiac Risk Factors include: advanced age (>63men, >21 women);hypertension;male gender;dyslipidemia;diabetes mellitus     Objective:    Today's Vitals   12/18/23 1024 12/21/23 0833  Weight:  157 lb (71.2 kg)  Height:  6' 2 (1.88 m)  PainSc: 7     Body mass index is 20.16 kg/m.     12/21/2023    8:51 AM 01/10/2023    9:44 AM 01/08/2022   11:00 AM 04/15/2021   12:32 PM 12/19/2020   10:08 AM 09/14/2019    6:27 AM 06/24/2019    3:23 PM  Advanced Directives  Does Patient Have a Medical Advance Directive? Yes Yes No No No No No  Type of Advance Directive Living will  Healthcare Power of [place];Living will       Copy of Healthcare Power of Attorney in Chart? No - copy requested No - copy requested       Would patient like information on creating a medical advance directive?   No - Patient declined   Yes (ED - Information included in AVS) No - Patient declined    Current Medications (verified) Outpatient Encounter Medications as of 12/21/2023  Medication Sig   albuterol  (VENTOLIN  HFA) 108 (90 Base) MCG/ACT inhaler Inhale 1-2 puffs into the lungs every 6 (six) hours as needed for wheezing or shortness of breath.   diclofenac  Sodium (VOLTAREN ) 1 % GEL Apply 2 g topically 4 (four) times daily. Rub into affected area of foot 2 to 4 times daily   ezetimibe  (ZETIA ) 10 MG tablet TAKE 1 TABLET BY MOUTH EVERY DAY   Fluticasone-Umeclidin-Vilant (TRELEGY ELLIPTA ) 100-62.5-25 MCG/ACT AEPB Inhale 1 puff into the lungs daily.   levothyroxine  (SYNTHROID ) 150 MCG tablet Take 1 tablet (150 mcg total) by mouth daily before breakfast.   metFORMIN  (GLUCOPHAGE -XR) 500 MG 24 hr tablet Take 1 tablet (500 mg total) by mouth daily with breakfast.   metoprolol  tartrate (LOPRESSOR ) 100 MG tablet Take tablet (100mg ) TWO hours prior to your cardiac CT scan.   mirtazapine  (REMERON ) 30 MG tablet TAKE 1 TABLET BY MOUTH AT BEDTIME.   omeprazole  (PRILOSEC) 40 MG capsule TAKE 1 CAPSULE (40 MG TOTAL) BY MOUTH DAILY.   rosuvastatin  (CRESTOR ) 40 MG tablet TAKE 1 TABLET BY MOUTH EVERY DAY   tamsulosin  (FLOMAX ) 0.4  MG CAPS capsule Take 1 capsule (0.4 mg total) by mouth daily.   No facility-administered encounter medications on file as of 12/21/2023.    Allergies (verified) Patient has no known allergies.   History: Past Medical History:  Diagnosis Date   Anxiety    Chronic abdominal pain    Colon polyps    Depression    Diabetes mellitus without complication ([name])    GERD (gastroesophageal reflux disease)    Hyperlipidemia    Kidney stones    PNA (pneumonia)    Thyroid  cancer ([name])     Past Surgical History:  Procedure Laterality Date   ANKLE ARTHROSCOPY WITH RECONSTRUCTION     BACK SURGERY     CHOLECYSTECTOMY     COLONOSCOPY     ELBOW ARTHROSCOPY WITH TENDON RECONSTRUCTION     SHOULDER ARTHROSCOPY WITH LABRAL REPAIR     THYROIDECTOMY     2004/2005   ULNAR NERVE TRANSPOSITION     Family History  Problem Relation Age of Onset   Hypertension Mother    Hyperthyroidism Mother    Cancer Mother 75       thyroid  cancer   Kidney disease Father    Colon polyps Father    Kidney cancer Father    Hypertension Father    Thyroid  cancer Maternal Uncle    Neuropathy Neg Hx    Colon cancer Neg Hx    Esophageal cancer Neg Hx    Stomach cancer Neg Hx    Rectal cancer Neg Hx    Social History   Socioeconomic History   Marital status: Single    Spouse name: Not on file   Number of children: 2   Years of education: Not on file   Highest education level: Associate degree: academic program  Occupational History   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  Tobacco Use   Smoking status: Some Days    Current packs/day: 0.25    Average packs/day: 0.3 packs/day for 35.0 years (8.8 ttl pk-yrs)    Types: Cigarettes    Passive exposure: Current (down to 2 a day)   Smokeless tobacco: Never  Vaping Use   Vaping status: Never Used  Substance and Sexual Activity   Alcohol use: Not Currently    Comment: occ   Drug use: Yes    Frequency: 3.0 times per week    Types: Marijuana    Comment: week ago   Sexual activity: Not Currently    Partners: Female  Other Topics Concern   Not on file  Social History Narrative   Lives alone/2025   Social Drivers of Health   Financial Resource Strain: Medium Risk (12/21/2023)   Overall Financial Resource Strain (CARDIA)    Difficulty of Paying Living Expenses: Somewhat hard  Food Insecurity: No Food Insecurity (12/21/2023)   Hunger Vital Sign    Worried About Running Out of Food in the Last Year: Never true    Ran Out of Food in the Last  Year: Never true  Transportation Needs: Unmet Transportation Needs (12/21/2023)   PRAPARE - Transportation    Lack of Transportation (Medical): Yes    Lack of Transportation (Non-Medical): Yes  Physical Activity: Sufficiently Active (12/21/2023)   Exercise Vital Sign    Days of Exercise per Week: 7 days    Minutes of Exercise per Session: 30 min  Recent Concern: Physical Activity - Insufficiently Active (10/26/2023)   Exercise Vital Sign    Days of Exercise per Week: 1 day    Minutes of Exercise  per Session: 30 min  Stress: Stress Concern Present (12/21/2023)   [employer] of Occupational Health - Occupational Stress Questionnaire    Feeling of Stress: Rather much  Social Connections: Socially Isolated (12/21/2023)   Social Connection and Isolation Panel    Frequency of Communication with Friends and Family: Once a week    Frequency of Social Gatherings with Friends and Family: More than three times a week    Attends Religious Services: Never    Database administrator or Organizations: No    Attends Engineer, structural: Never    Marital Status: Divorced    Tobacco Counseling Ready to quit: Not Answered Counseling given: Not Answered    Clinical Intake:  Pre-visit preparation completed: Yes  Pain : 0-10 Pain Score: 7  Pain Type: Chronic pain Pain Location:  (lower back, rt hip groin area) Pain Descriptors / Indicators: Aching, Discomfort Pain Onset: More than a month ago Pain Frequency: Intermittent Effect of Pain on Daily Activities: Walking, sitting too long or and standing too long     BMI - recorded: 20.16 Nutritional Status: BMI of 19-24  Normal Nutritional Risks: None Diabetes: Yes CBG done?: No Did pt. bring in CBG monitor from home?: No  Lab Results  Component Value Date   HGBA1C 7.0 (H) 08/17/2023   HGBA1C 7.1 (H) 12/29/2022   HGBA1C 7.2 (H) 08/12/2022     How often do you need to have someone help you when you read instructions, pamphlets,  or other written materials from your doctor or pharmacy?: 1 - Never  Interpreter Needed?: No  Information entered by :: Berle Fitz, RMA   Activities of Daily Living     12/18/2023   10:24 AM 01/09/2023    3:25 PM  In your present state of health, do you have any difficulty performing the following activities:  Hearing? 0 0  Vision? 0 1  Difficulty concentrating or making decisions? 0 1  Walking or climbing stairs? 1 0  Dressing or bathing? 0 0  Doing errands, shopping? 0 1  Preparing Food and eating ? N N  Using the Toilet? N N  In the past six months, have you accidently leaked urine? N N  Do you have problems with loss of bowel control? N N  Managing your Medications? N N  Managing your Finances? N N  Housekeeping or managing your Housekeeping? N N    Patient Care Team: Joshua Lee CROME, MD as PCP - General (Internal Medicine) [facility] Cleotilde Sewer, OD (Optometry)  I have updated your Care Teams any recent Medical Services you may have received from other providers in the past year.     Assessment:   This is a routine wellness examination for [facility].  Hearing/Vision screen Hearing Screening - Comments:: Denies hearing difficulties   Vision Screening - Comments:: Wears eyeglasses/   Goals Addressed               This Visit's Progress     Exercise 3x per week (30 min per time) (pt-stated)        Not on track but would like to maintain that goal       Depression Screen     12/21/2023    8:54 AM 01/10/2023    9:46 AM 12/13/2022    8:31 AM 09/20/2022    2:37 PM 08/12/2022    2:07 PM 04/14/2022    9:21 AM 01/08/2022   10:57 AM  PHQ 2/9 Scores  PHQ - 2 Score 3 4 4 1 3  0 0  PHQ- 9 Score 10 13 9  8  0 0    Fall Risk     12/18/2023   10:24 AM 10/27/2023   10:48 AM 01/09/2023    3:25 PM 12/13/2022    8:30 AM 08/12/2022    2:07 PM  Fall Risk   Falls in the past year? 0 0 0 0 1  Number falls in past yr: 0 0 0 0 1  Injury with Fall? 0 0   0 1  Risk for fall due to :  No Fall Risks No Fall Risks No Fall Risks History of fall(s)  Follow up Falls evaluation completed;Falls prevention discussed Falls evaluation completed Falls evaluation completed;Falls prevention discussed Falls evaluation completed Falls evaluation completed    MEDICARE RISK AT HOME:  Medicare Risk at Home Any stairs in or around the home?: (Patient-Rptd) Yes If so, are there any without handrails?: (Patient-Rptd) No Home free of loose throw rugs in walkways, pet beds, electrical cords, etc?: (Patient-Rptd) Yes Adequate lighting in your home to reduce risk of falls?: (Patient-Rptd) No Life alert?: (Patient-Rptd) No Use of a cane, walker or w/c?: (Patient-Rptd) Yes Grab bars in the bathroom?: (Patient-Rptd) Yes Shower chair or bench in shower?: (Patient-Rptd) No Elevated toilet seat or a handicapped toilet?: (Patient-Rptd) Yes  TIMED UP AND GO:  Was the test performed?  No  Cognitive Function: Declined/Normal: No cognitive concerns noted by patient or family. Patient alert, oriented, able to answer questions appropriately and recall recent events. No signs of memory loss or confusion.        01/10/2023    9:44 AM 01/08/2022   11:02 AM  6CIT Screen  What Year? 0 points 0 points  What month? 0 points 0 points  What time? 0 points 0 points  Count back from 20 0 points 0 points  Months in reverse 0 points 0 points  Repeat phrase 0 points 0 points  Total Score 0 points 0 points    Immunizations Immunization History  Administered Date(s) Administered   Fluad Quad(high Dose 65+) 12/14/2018, 02/18/2020, 01/05/2022   Fluad Trivalent(High Dose 65+) 12/29/2022   INFLUENZA, HIGH DOSE SEASONAL PF 04/26/2018, 11/18/2018, 12/14/2018, 02/18/2020   Influenza,inj,Quad PF,6+ Mos 02/16/2017   Influenza-Unspecified 12/18/2016, 02/16/2017   Moderna SARS-COV2 Booster Vaccination 01/30/2021   Moderna Sars-Covid-2 Vaccination 06/21/2019, 07/19/2019, 02/18/2020    Pneumococcal Conjugate-13 01/19/2018   Pneumococcal Polysaccharide-23 02/26/2019, 01/05/2022   Tdap 04/19/2014, 07/06/2015   Zoster Recombinant(Shingrix ) 01/14/2022    Screening Tests Health Maintenance  Topic Date Due   Zoster Vaccines- Shingrix  (2 of 2) 03/11/2022   INFLUENZA VACCINE  11/18/2023   Medicare Annual Wellness (AWV)  01/10/2024   FOOT EXAM  12/29/2023   OPHTHALMOLOGY EXAM  02/15/2024   HEMOGLOBIN A1C  02/16/2024   Diabetic kidney evaluation - Urine ACR  08/16/2024   Diabetic kidney evaluation - eGFR measurement  09/05/2024   Fecal DNA (Cologuard)  12/08/2024   DTaP/Tdap/Td (3 - Td or Tdap) 07/05/2025   Pneumococcal Vaccine: 50+ Years  Completed   Hepatitis C Screening  Completed   HPV VACCINES  Aged Out   Meningococcal B Vaccine  Aged Out   Colonoscopy  Discontinued   COVID-19 Vaccine  Discontinued    Health Maintenance  Health Maintenance Due  Topic Date Due   Zoster Vaccines- Shingrix  (2 of 2) 03/11/2022   INFLUENZA VACCINE  11/18/2023   Medicare Annual Wellness (AWV)  01/10/2024   Health  Maintenance Items Addressed: See Nurse Notes at the end of this note  Additional Screening:  Vision Screening: Recommended annual ophthalmology exams for early detection of glaucoma and other disorders of the eye. Would you like a referral to an eye doctor? No    Dental Screening: Recommended annual dental exams for proper oral hygiene  Community Resource Referral / Chronic Care Management: CRR required this visit?  No   CCM required this visit?  No   Plan:    I have personally reviewed and noted the following in the patient's chart:   Medical and social history Use of alcohol, tobacco or illicit drugs  Current medications and supplements including opioid prescriptions. Patient is not currently taking opioid prescriptions. Functional ability and status Nutritional status Physical activity Advanced directives List of other physicians Hospitalizations,  surgeries, and ER visits in previous 12 months Vitals Screenings to include cognitive, depression, and falls Referrals and appointments  In addition, I have reviewed and discussed with patient certain preventive protocols, quality metrics, and best practice recommendations. A written personalized care plan for preventive services as well as general preventive health recommendations were provided to patient.   Kennie Snedden L Tiffancy Moger, CMA   12/21/2023   After Visit Summary: (MyChart) Due to this being a telephonic visit, the after visit summary with patients personalized plan was offered to patient via MyChart   Notes: Patient is due for a 2nd Shingrix  vaccine.  He is requesting refill on all medications today except TRELEGY ELLIPTA , as patient stated that he could not afford this.  Patient also is requesting a refill for Oxycodone  for his chronic pain.   Last refill for oxyCODONE  ER (XTAMPZA  ER) 9 MG C12A Take 1 capsule by mouth 2 (two) times daily as needed. Dispense: 60 capsule, Refills: 0 ordered   08/17/2023 10/27/2023

## 2023-12-28 ENCOUNTER — Other Ambulatory Visit: Payer: Self-pay | Admitting: Internal Medicine

## 2023-12-28 DIAGNOSIS — E890 Postprocedural hypothyroidism: Secondary | ICD-10-CM

## 2023-12-28 MED ORDER — LEVOTHYROXINE SODIUM 150 MCG PO TABS: 150.0000 ug | ORAL_TABLET | Freq: Every day | ORAL | 0 refills | Status: DC

## 2023-12-28 NOTE — Telephone Encounter (Signed)
 Copied from CRM [id number]. Topic: Clinical - Medication Refill >> Dec 28, 2023  3:14 PM Dedra B wrote: Medication: levothyroxine  (SYNTHROID ) 150 MCG tablet. Pt requested refill 9/3 during AWV.  Has the patient contacted their pharmacy? Yes, pharmacy tried contacting office for refill   This is the patient's preferred pharmacy:  [id number] - [place], [place] - [address] AT [facility] [address] [place] [address] Phone: [phone] Fax: [phone]  Is this the correct pharmacy for this prescription? Yes   Has the prescription been filled recently? No  Is the patient out of the medication? Yes  Has the patient been seen for an appointment in the last year OR does the patient have an upcoming appointment? Yes  Can we respond through MyChart? Yes  Agent: Please be advised that Rx refills may take up to 3 business days. We ask that you follow-up with your pharmacy.

## 2023-12-28 NOTE — Telephone Encounter (Signed)
 FYI Only or Action Required?: Action required by provider: medication refill request.  Patient was last seen in primary care on 10/27/2023 by Joshua Debby CROME, MD.  Called Nurse Triage reporting No chief complaint on file..  Symptoms began today.  Interventions attempted: Nothing.  Symptoms are: stable.  Triage Disposition: No disposition on file.  Patient/caregiver understands and will follow disposition?:

## 2024-01-18 ENCOUNTER — Other Ambulatory Visit: Payer: Self-pay | Admitting: Internal Medicine

## 2024-01-18 DIAGNOSIS — E785 Hyperlipidemia, unspecified: Secondary | ICD-10-CM

## 2024-01-18 DIAGNOSIS — I2510 Atherosclerotic heart disease of native coronary artery without angina pectoris: Secondary | ICD-10-CM

## 2024-01-23 ENCOUNTER — Encounter: Payer: Self-pay | Admitting: Internal Medicine

## 2024-01-23 ENCOUNTER — Ambulatory Visit: Admitting: Internal Medicine

## 2024-01-23 VITALS — BP 124/72 | HR 83 | Temp 98.7°F | Resp 16 | Ht 74.0 in | Wt 159.8 lb

## 2024-01-23 DIAGNOSIS — Z79891 Long term (current) use of opiate analgesic: Secondary | ICD-10-CM

## 2024-01-23 DIAGNOSIS — E890 Postprocedural hypothyroidism: Secondary | ICD-10-CM

## 2024-01-23 DIAGNOSIS — E118 Type 2 diabetes mellitus with unspecified complications: Secondary | ICD-10-CM

## 2024-01-23 DIAGNOSIS — M5136 Other intervertebral disc degeneration, lumbar region with discogenic back pain only: Secondary | ICD-10-CM | POA: Diagnosis not present

## 2024-01-23 DIAGNOSIS — E1140 Type 2 diabetes mellitus with diabetic neuropathy, unspecified: Secondary | ICD-10-CM | POA: Diagnosis not present

## 2024-01-23 DIAGNOSIS — J432 Centrilobular emphysema: Secondary | ICD-10-CM | POA: Diagnosis not present

## 2024-01-23 DIAGNOSIS — M150 Primary generalized (osteo)arthritis: Secondary | ICD-10-CM | POA: Diagnosis not present

## 2024-01-23 DIAGNOSIS — Z23 Encounter for immunization: Secondary | ICD-10-CM

## 2024-01-23 DIAGNOSIS — I10 Essential (primary) hypertension: Secondary | ICD-10-CM | POA: Diagnosis not present

## 2024-01-23 DIAGNOSIS — G894 Chronic pain syndrome: Secondary | ICD-10-CM

## 2024-01-23 DIAGNOSIS — M5030 Other cervical disc degeneration, unspecified cervical region: Secondary | ICD-10-CM | POA: Diagnosis not present

## 2024-01-23 DIAGNOSIS — F6089 Other specific personality disorders: Secondary | ICD-10-CM

## 2024-01-23 DIAGNOSIS — M4726 Other spondylosis with radiculopathy, lumbar region: Secondary | ICD-10-CM

## 2024-01-23 MED ORDER — OXYCODONE HCL 5 MG PO TABS: 5.0000 mg | ORAL_TABLET | Freq: Three times a day (TID) | ORAL | 0 refills | Status: DC | PRN

## 2024-01-23 MED ORDER — NALOXONE HCL 4 MG/0.1ML NA LIQD: 1.0000 | Freq: Once | NASAL | 3 refills | Status: AC

## 2024-01-23 MED ORDER — COVID-19 MRNA VAC-TRIS(PFIZER) 30 MCG/0.3ML IM SUSY: 0.3000 mL | PREFILLED_SYRINGE | Freq: Once | INTRAMUSCULAR | 0 refills | Status: AC

## 2024-01-23 MED ORDER — SHINGRIX 50 MCG/0.5ML IM SUSR: 0.5000 mL | Freq: Once | INTRAMUSCULAR | 1 refills | Status: AC

## 2024-01-23 NOTE — Patient Instructions (Signed)

## 2024-01-23 NOTE — Progress Notes (Signed)
 Subjective:  Patient ID: Lee Mitchell, male    DOB: 01/06/1951  Age: 73 y.o. MRN: [id number]  CC: Neuropathy (Bilateral feet. Patient states that its getting worst ), Diabetes, COPD, Back Pain, Hypothyroidism, and Osteoarthritis   HPI Lee Mitchell presents for f/up --  Discussed the use of AI scribe software for clinical note transcription with the patient, who gave verbal consent to proceed.  History of Present Illness Lee Mitchell is a 73 year old male with a history of back surgery who presents with chronic back and hip pain.  He experiences chronic pain primarily located in the lower back on the right side, extending to the hip and groin. The pain is described as deep and sometimes accompanied by muscle cramps. It does not radiate into the legs and is not associated with sharp pain similar to what he experienced prior to his previous back surgery. The pain can be severe enough to prevent him from getting out of bed for days or weeks at a time.  He has a history of back surgery and notes that the current pain is not the same as the pain he had before the surgery, although it is similar. He has tried various methods for pain relief, including kratom and THC, but is concerned about the implications of using these substances.  He has a history of elevated PSA levels and has been seen by urology, where he recalls that surgery and biopsy were mentioned during his visits. He is anxious about undergoing further surgeries, citing past orthopedic surgeries related to sports injuries.  He reports a habit of not drinking enough fluids, leading to dehydration, but denies any urinary symptoms. He has been trying to increase his water  and Gatorade intake.  He mentions memory concerns, experiencing frequent forgetfulness, which he attributes to aging and possibly his environment at a new living location. He uses THC for pain relief and relaxation but denies using it to get  high.  He has not received a flu vaccine yet and is interested in getting vaccinated. He has had the first dose of the shingles vaccine but not the second, and it has been over a year since the first dose.     Outpatient Medications Prior to Visit  Medication Sig Dispense Refill   albuterol  (VENTOLIN  HFA) 108 (90 Base) MCG/ACT inhaler Inhale 1-2 puffs into the lungs every 6 (six) hours as needed for wheezing or shortness of breath. 6.7 each 5   diclofenac  Sodium (VOLTAREN ) 1 % GEL Apply 2 g topically 4 (four) times daily. Rub into affected area of foot 2 to 4 times daily 100 g 2   ezetimibe  (ZETIA ) 10 MG tablet TAKE 1 TABLET BY MOUTH EVERY DAY 90 tablet 0   Fluticasone-Umeclidin-Vilant (TRELEGY ELLIPTA ) 100-62.5-25 MCG/ACT AEPB Inhale 1 puff into the lungs daily. 120 each 0   levothyroxine  (SYNTHROID ) 150 MCG tablet Take 1 tablet (150 mcg total) by mouth daily before breakfast. 90 tablet 0   metFORMIN  (GLUCOPHAGE -XR) 500 MG 24 hr tablet TAKE 1 TABLET BY MOUTH EVERY DAY WITH BREAKFAST 90 tablet 0   metoprolol  tartrate (LOPRESSOR ) 100 MG tablet Take tablet (100mg ) TWO hours prior to your cardiac CT scan. 1 tablet 0   mirtazapine  (REMERON ) 30 MG tablet TAKE 1 TABLET BY MOUTH AT BEDTIME. 90 tablet 0   omeprazole  (PRILOSEC) 40 MG capsule TAKE 1 CAPSULE (40 MG TOTAL) BY MOUTH DAILY. 90 capsule 0   rosuvastatin  (CRESTOR ) 40 MG tablet TAKE 1 TABLET BY  MOUTH EVERY DAY 90 tablet 0   tamsulosin  (FLOMAX ) 0.4 MG CAPS capsule Take 1 capsule (0.4 mg total) by mouth daily. 90 capsule 0   No facility-administered medications prior to visit.    ROS Review of Systems  Constitutional:  Negative for appetite change, chills, diaphoresis, fatigue and fever.  HENT: Negative.  Negative for trouble swallowing.   Eyes: Negative.   Respiratory: Negative.  Negative for cough, chest tightness, shortness of breath and wheezing.   Cardiovascular:  Negative for chest pain, palpitations and leg swelling.   Gastrointestinal: Negative.  Negative for abdominal pain, blood in stool, constipation, diarrhea, nausea and vomiting.  Endocrine: Negative.   Genitourinary: Negative.  Negative for difficulty urinating.  Musculoskeletal:  Positive for arthralgias, back pain and neck pain. Negative for joint swelling, myalgias and neck stiffness.  Skin: Negative.   Neurological:  Negative for dizziness, weakness and light-headedness.  Hematological:  Negative for adenopathy. Does not bruise/bleed easily.  Psychiatric/Behavioral: Negative.      Objective:  BP 124/72 (BP Location: Left Arm, Patient Position: Sitting, Cuff Size: Normal)   Pulse 83   Temp 98.7 F (37.1 C) (Oral)   Resp 16   Ht 6' 2 (1.88 m)   Wt 159 lb 12.8 oz (72.5 kg)   SpO2 96%   BMI 20.52 kg/m   BP Readings from Last 3 Encounters:  01/23/24 124/72  10/27/23 130/72  09/06/23 121/81    Wt Readings from Last 3 Encounters:  01/23/24 159 lb 12.8 oz (72.5 kg)  12/21/23 157 lb (71.2 kg)  10/27/23 157 lb 6.4 oz (71.4 kg)    Physical Exam Vitals reviewed.  Constitutional:      Appearance: Normal appearance.  HENT:     Nose: Nose normal.     Mouth/Throat:     Mouth: Mucous membranes are moist.  Eyes:     General: No scleral icterus.    Conjunctiva/sclera: Conjunctivae normal.  Cardiovascular:     Rate and Rhythm: Normal rate and regular rhythm.     Heart sounds: No murmur heard.    No friction rub. No gallop.  Pulmonary:     Effort: Pulmonary effort is normal.     Breath sounds: No stridor. No wheezing, rhonchi or rales.  Abdominal:     General: Abdomen is flat.     Palpations: There is no mass.     Tenderness: There is no abdominal tenderness. There is no guarding.     Hernia: No hernia is present.  Musculoskeletal:        General: Normal range of motion.     Cervical back: Neck supple.     Right lower leg: No edema.     Left lower leg: No edema.  Lymphadenopathy:     Cervical: No cervical adenopathy.   Skin:    General: Skin is warm and dry.  Neurological:     General: No focal deficit present.     Mental Status: He is alert.  Psychiatric:        Mood and Affect: Mood normal.        Behavior: Behavior normal.     Lab Results  Component Value Date   WBC 9.6 09/06/2023   HGB 13.3 09/06/2023   HCT 39.7 09/06/2023   PLT 272 09/06/2023   GLUCOSE 103 (H) 09/06/2023   CHOL 129 08/17/2023   TRIG 115.0 08/17/2023   HDL 48.90 08/17/2023   LDLCALC 57 08/17/2023   ALT 40 09/06/2023   AST 27 09/06/2023  NA 138 09/06/2023   K 3.9 09/06/2023   CL 107 09/06/2023   CREATININE 0.80 09/06/2023   BUN 14 09/06/2023   CO2 21 (L) 09/06/2023   TSH 1.291 09/06/2023   PSA 8.17 (H) 08/17/2023   INR 1.0 09/06/2023   HGBA1C 7.0 (H) 08/17/2023   MICROALBUR 3.9 (H) 08/17/2023    MR Brain W and Wo Contrast Result Date: 09/06/2023 CLINICAL DATA:  Neuro deficit, acute, stroke suspected. Speech difficulty. History of thyroid  cancer. EXAM: MRI HEAD WITHOUT AND WITH CONTRAST TECHNIQUE: Multiplanar, multiecho pulse sequences of the brain and surrounding structures were obtained without and with intravenous contrast. CONTRAST:  7.5mL GADAVIST  GADOBUTROL  1 MMOL/ML IV SOLN COMPARISON:  Head MRI 07/11/2021 FINDINGS: Brain: There is no evidence of an acute infarct, intracranial hemorrhage, mass, midline shift, or extra-axial fluid collection. Scattered small T2 hyperintensities in the cerebral white matter bilaterally are unchanged and nonspecific but compatible with mild chronic small vessel ischemic disease. 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 No abnormal enhancement is identified. There is mild generalized cerebral atrophy. Vascular: Major intracranial vascular flow voids are preserved. Skull and upper cervical spine: No suspicious marrow lesion. Sinuses/Orbits: Unremarkable orbits. Paranasal sinuses and mastoid air cells are largely clear. Other: None.  IMPRESSION: 1. No acute intracranial abnormality or evidence of metastatic disease. 2. Mild chronic small vessel ischemic disease. Electronically Signed   By: Dasie Hamburg M.D.   On: 09/06/2023 16:24   CT Angio Chest PE W and/or Wo Contrast Result Date: 09/06/2023 CLINICAL DATA:  Pulmonary embolism (PE) suspected, high prob; RLQ abdominal pain. Of EXAM: CT ANGIOGRAPHY CHEST CT ABDOMEN AND PELVIS WITH CONTRAST TECHNIQUE: Multidetector CT imaging of the chest was performed using the standard protocol during bolus administration of intravenous contrast. Multiplanar CT image reconstructions and MIPs were obtained to evaluate the vascular anatomy. Multidetector CT imaging of the abdomen and pelvis was performed using the standard protocol during bolus administration of intravenous contrast. RADIATION DOSE REDUCTION: This exam was performed according to the departmental dose-optimization program which includes automated exposure control, adjustment of the mA and/or kV according to patient size and/or use of iterative reconstruction technique. CONTRAST:  OMNIPAQUE  IOHEXOL  350 MG/ML SOLN COMPARISON:  CT scan abdomen and pelvis from 12/19/2020 and CT angiography chest from 12/29/2021. FINDINGS: CTA CHEST FINDINGS Cardiovascular: No evidence of embolism to the proximal subsegmental pulmonary artery level. Normal cardiac size. No pericardial effusion. No aortic aneurysm. Mediastinum/Nodes: Surgically absent versus diminutive thyroid  gland. There are surgical staples in the right thyroid  bed. No solid / cystic mediastinal masses. The esophagus is nondistended precluding optimal assessment. No axillary, mediastinal or hilar lymphadenopathy by size criteria. Lungs/Pleura: The central tracheo-bronchial tree is patent. There is mild, smooth, circumferential thickening of the segmental and subsegmental bronchial walls, throughout bilateral lungs, which is nonspecific. Findings are most commonly seen with bronchitis or  reactive airway disease, such as asthma. Mild upper lobe predominant centrilobular emphysematous changes noted. There are patchy areas of linear, plate-like atelectasis and/or scarring throughout bilateral lungs. No mass or consolidation. No pleural effusion or pneumothorax. No suspicious lung nodules. Musculoskeletal: The visualized soft tissues of the chest wall are grossly unremarkable. No suspicious osseous lesions. There are mild multilevel degenerative changes in the visualized spine. Review of the MIP images confirms the above findings. CT ABDOMEN and PELVIS FINDINGS Hepatobiliary: The liver is normal in size. Non-cirrhotic configuration. No suspicious mass. There are at least 6 hemangiomas in the liver with  largest in the right hepatic lobe, predominantly in segment 7/8. These are grossly unchanged since the prior study. No intrahepatic or extrahepatic bile duct dilation. Gallbladder is surgically absent. Pancreas: Unremarkable. No pancreatic ductal dilatation or surrounding inflammatory changes. Spleen: Within normal limits. No focal lesion. Adrenals/Urinary Tract: Adrenal glands are unremarkable. No suspicious renal mass. There are multiple cysts throughout bilateral kidneys with largest arising from the left kidney upper pole, anteromedially measuring up to 1.4 x 1.5 cm. No hydroureteronephrosis. There is excreted contrast in bilateral renal collecting systems, limiting the evaluation for nonocclusive calculi. Urinary bladder is under distended, precluding optimal assessment. However, no large mass or stones identified. No perivesical fat stranding. Stomach/Bowel: No disproportionate dilation of the small or large bowel loops. No evidence of abnormal bowel wall thickening or inflammatory changes. The appendix is unremarkable. There are multiple diverticula mainly in the sigmoid colon, without imaging signs of diverticulitis. Vascular/Lymphatic: No ascites or pneumoperitoneum. No abdominal or pelvic  lymphadenopathy, by size criteria. No aneurysmal dilation of the major abdominal arteries. There are mild peripheral atherosclerotic vascular calcifications of the aorta and its major branches. Reproductive: Enlarged prostate. Symmetric seminal vesicles. Other: There are fat containing umbilical and bilateral inguinal hernias. The soft tissues and abdominal wall are otherwise unremarkable. Musculoskeletal: No suspicious osseous lesions. There are mild - moderate multilevel degenerative changes in the visualized spine. Note is made of L3 through S1 spinal fixation. Review of the MIP images confirms the above findings. IMPRESSION: 1. No embolism to the proximal subsegmental pulmonary artery level. 2. No acute inflammatory process identified within the abdomen or pelvis. 3. Multiple other nonacute observations, as described above. Aortic Atherosclerosis (ICD10-I70.0) and Emphysema (ICD10-J43.9). Electronically Signed   By: Ree Molt M.D.   On: 09/06/2023 16:22   CT ABDOMEN PELVIS W CONTRAST Result Date: 09/06/2023 CLINICAL DATA:  Pulmonary embolism (PE) suspected, high prob; RLQ abdominal pain. Of EXAM: CT ANGIOGRAPHY CHEST CT ABDOMEN AND PELVIS WITH CONTRAST TECHNIQUE: Multidetector CT imaging of the chest was performed using the standard protocol during bolus administration of intravenous contrast. Multiplanar CT image reconstructions and MIPs were obtained to evaluate the vascular anatomy. Multidetector CT imaging of the abdomen and pelvis was performed using the standard protocol during bolus administration of intravenous contrast. RADIATION DOSE REDUCTION: This exam was performed according to the departmental dose-optimization program which includes automated exposure control, adjustment of the mA and/or kV according to patient size and/or use of iterative reconstruction technique. CONTRAST:  OMNIPAQUE  IOHEXOL  350 MG/ML SOLN COMPARISON:  CT scan abdomen and pelvis from 12/19/2020 and CT angiography  chest from 12/29/2021. FINDINGS: CTA CHEST FINDINGS Cardiovascular: No evidence of embolism to the proximal subsegmental pulmonary artery level. Normal cardiac size. No pericardial effusion. No aortic aneurysm. Mediastinum/Nodes: Surgically absent versus diminutive thyroid  gland. There are surgical staples in the right thyroid  bed. No solid / cystic mediastinal masses. The esophagus is nondistended precluding optimal assessment. No axillary, mediastinal or hilar lymphadenopathy by size criteria. Lungs/Pleura: The central tracheo-bronchial tree is patent. There is mild, smooth, circumferential thickening of the segmental and subsegmental bronchial walls, throughout bilateral lungs, which is nonspecific. Findings are most commonly seen with bronchitis or reactive airway disease, such as asthma. Mild upper lobe predominant centrilobular emphysematous changes noted. There are patchy areas of linear, plate-like atelectasis and/or scarring throughout bilateral lungs. No mass or consolidation. No pleural effusion or pneumothorax. No suspicious lung nodules. Musculoskeletal: The visualized soft tissues of the chest wall are grossly unremarkable. No suspicious osseous lesions. There are mild  multilevel degenerative changes in the visualized spine. Review of the MIP images confirms the above findings. CT ABDOMEN and PELVIS FINDINGS Hepatobiliary: The liver is normal in size. Non-cirrhotic configuration. No suspicious mass. There are at least 6 hemangiomas in the liver with largest in the right hepatic lobe, predominantly in segment 7/8. These are grossly unchanged since the prior study. No intrahepatic or extrahepatic bile duct dilation. Gallbladder is surgically absent. Pancreas: Unremarkable. No pancreatic ductal dilatation or surrounding inflammatory changes. Spleen: Within normal limits. No focal lesion. Adrenals/Urinary Tract: Adrenal glands are unremarkable. No suspicious renal mass. There are multiple cysts throughout  bilateral kidneys with largest arising from the left kidney upper pole, anteromedially measuring up to 1.4 x 1.5 cm. No hydroureteronephrosis. There is excreted contrast in bilateral renal collecting systems, limiting the evaluation for nonocclusive calculi. Urinary bladder is under distended, precluding optimal assessment. However, no large mass or stones identified. No perivesical fat stranding. Stomach/Bowel: No disproportionate dilation of the small or large bowel loops. No evidence of abnormal bowel wall thickening or inflammatory changes. The appendix is unremarkable. There are multiple diverticula mainly in the sigmoid colon, without imaging signs of diverticulitis. Vascular/Lymphatic: No ascites or pneumoperitoneum. No abdominal or pelvic lymphadenopathy, by size criteria. No aneurysmal dilation of the major abdominal arteries. There are mild peripheral atherosclerotic vascular calcifications of the aorta and its major branches. Reproductive: Enlarged prostate. Symmetric seminal vesicles. Other: There are fat containing umbilical and bilateral inguinal hernias. The soft tissues and abdominal wall are otherwise unremarkable. Musculoskeletal: No suspicious osseous lesions. There are mild - moderate multilevel degenerative changes in the visualized spine. Note is made of L3 through S1 spinal fixation. Review of the MIP images confirms the above findings. IMPRESSION: 1. No embolism to the proximal subsegmental pulmonary artery level. 2. No acute inflammatory process identified within the abdomen or pelvis. 3. Multiple other nonacute observations, as described above. Aortic Atherosclerosis (ICD10-I70.0) and Emphysema (ICD10-J43.9). Electronically Signed   By: Ree Molt M.D.   On: 09/06/2023 16:22    Assessment & Plan:  Primary hypertension- His BP is well controlled. -     Basic metabolic panel with GFR; Future -     TSH; Future  Postoperative hypothyroidism- Will monitor his TSH. -     TSH;  Future  Centrilobular emphysema ([name])  Need for immunization against influenza -     Flu vaccine HIGH DOSE PF(Fluzone Trivalent)  Need for prophylactic vaccination and inoculation against varicella -     Shingrix ; Inject 0.5 mLs into the muscle once for 1 dose.  Dispense: 0.5 mL; Refill: 1  Primary osteoarthritis involving multiple joints -     oxyCODONE  HCl; Take 1 tablet (5 mg total) by mouth every 8 (eight) hours as needed for severe pain (pain score 7-10).  Dispense: 90 tablet; Refill: 0  Chronic pain syndrome -     oxyCODONE  HCl; Take 1 tablet (5 mg total) by mouth every 8 (eight) hours as needed for severe pain (pain score 7-10).  Dispense: 90 tablet; Refill: 0  Diabetic neuropathy, painful ([name]) -     oxyCODONE  HCl; Take 1 tablet (5 mg total) by mouth every 8 (eight) hours as needed for severe pain (pain score 7-10).  Dispense: 90 tablet; Refill: 0  DDD (degenerative disc disease), cervical -     oxyCODONE  HCl; Take 1 tablet (5 mg total) by mouth every 8 (eight) hours as needed for severe pain (pain score 7-10).  Dispense: 90 tablet; Refill: 0  Degeneration of intervertebral  disc of lumbar region with discogenic back pain -     oxyCODONE  HCl; Take 1 tablet (5 mg total) by mouth every 8 (eight) hours as needed for severe pain (pain score 7-10).  Dispense: 90 tablet; Refill: 0  Other spondylosis with radiculopathy, lumbar region -     oxyCODONE  HCl; Take 1 tablet (5 mg total) by mouth every 8 (eight) hours as needed for severe pain (pain score 7-10).  Dispense: 90 tablet; Refill: 0  Encounter for long-term opiate analgesic use -     Naloxone  HCl; Place 1 spray into the nose once for 1 dose.  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  Controlled type 2 diabetes with neuropathy ([name])  Other orders -     COVID-19 mRNA Vac-TriS(Pfizer); Inject 0.3 mLs into the muscle once for 1 dose.  Dispense: 0.3 mL; Refill:  0     Follow-up: Return in about 3 months (around 04/24/2024).  Debby Molt, MD

## 2024-01-24 DIAGNOSIS — E1140 Type 2 diabetes mellitus with diabetic neuropathy, unspecified: Secondary | ICD-10-CM | POA: Insufficient documentation

## 2024-02-01 ENCOUNTER — Other Ambulatory Visit: Payer: Self-pay | Admitting: Internal Medicine

## 2024-02-01 DIAGNOSIS — F332 Major depressive disorder, recurrent severe without psychotic features: Secondary | ICD-10-CM

## 2024-02-02 ENCOUNTER — Encounter: Payer: Self-pay | Admitting: Internal Medicine

## 2024-02-03 ENCOUNTER — Other Ambulatory Visit

## 2024-02-03 ENCOUNTER — Other Ambulatory Visit: Payer: Self-pay

## 2024-02-03 DIAGNOSIS — I10 Essential (primary) hypertension: Secondary | ICD-10-CM

## 2024-02-03 DIAGNOSIS — E118 Type 2 diabetes mellitus with unspecified complications: Secondary | ICD-10-CM | POA: Diagnosis not present

## 2024-02-03 DIAGNOSIS — E890 Postprocedural hypothyroidism: Secondary | ICD-10-CM

## 2024-02-03 DIAGNOSIS — F332 Major depressive disorder, recurrent severe without psychotic features: Secondary | ICD-10-CM

## 2024-02-03 LAB — HEMOGLOBIN A1C: Hgb A1c MFr Bld: 7.2 % — ABNORMAL HIGH (ref 4.6–6.5)

## 2024-02-03 LAB — BASIC METABOLIC PANEL WITH GFR
BUN: 20 mg/dL (ref 6–23)
CO2: 25 meq/L (ref 19–32)
Calcium: 8.8 mg/dL (ref 8.4–10.5)
Chloride: 102 meq/L (ref 96–112)
Creatinine, Ser: 1.02 mg/dL (ref 0.40–1.50)
GFR: 74.18 mL/min (ref >60.00)
Glucose, Bld: 99 mg/dL (ref 70–99)
Potassium: 3.9 meq/L (ref 3.5–5.1)
Sodium: 135 meq/L (ref 135–145)

## 2024-02-03 LAB — TSH: TSH: 15.7 u[IU]/mL — ABNORMAL HIGH (ref 0.35–5.50)

## 2024-02-03 MED ORDER — MIRTAZAPINE 30 MG PO TABS: 30.0000 mg | ORAL_TABLET | Freq: Every day | ORAL | 0 refills | Status: DC

## 2024-02-04 ENCOUNTER — Ambulatory Visit: Payer: Self-pay | Admitting: Internal Medicine

## 2024-02-06 ENCOUNTER — Other Ambulatory Visit: Payer: Self-pay | Admitting: Internal Medicine

## 2024-02-06 DIAGNOSIS — E890 Postprocedural hypothyroidism: Secondary | ICD-10-CM

## 2024-02-06 MED ORDER — LEVOTHYROXINE SODIUM 25 MCG PO TABS: 25.0000 ug | ORAL_TABLET | Freq: Every day | ORAL | 0 refills | Status: DC

## 2024-02-22 ENCOUNTER — Other Ambulatory Visit: Payer: Self-pay | Admitting: Internal Medicine

## 2024-02-22 DIAGNOSIS — I2510 Atherosclerotic heart disease of native coronary artery without angina pectoris: Secondary | ICD-10-CM

## 2024-02-22 DIAGNOSIS — E785 Hyperlipidemia, unspecified: Secondary | ICD-10-CM

## 2024-03-11 ENCOUNTER — Encounter: Payer: Self-pay | Admitting: Internal Medicine

## 2024-03-11 ENCOUNTER — Other Ambulatory Visit: Payer: Self-pay | Admitting: Internal Medicine

## 2024-03-11 DIAGNOSIS — J432 Centrilobular emphysema: Secondary | ICD-10-CM

## 2024-03-12 ENCOUNTER — Other Ambulatory Visit: Payer: Self-pay

## 2024-03-12 DIAGNOSIS — J432 Centrilobular emphysema: Secondary | ICD-10-CM

## 2024-03-12 MED ORDER — ALBUTEROL SULFATE HFA 108 (90 BASE) MCG/ACT IN AERS: 1.0000 | INHALATION_SPRAY | Freq: Four times a day (QID) | RESPIRATORY_TRACT | 2 refills | Status: AC | PRN

## 2024-03-21 ENCOUNTER — Other Ambulatory Visit: Payer: Self-pay | Admitting: Internal Medicine

## 2024-03-21 DIAGNOSIS — K219 Gastro-esophageal reflux disease without esophagitis: Secondary | ICD-10-CM

## 2024-03-30 ENCOUNTER — Other Ambulatory Visit: Payer: Self-pay | Admitting: Internal Medicine

## 2024-04-16 ENCOUNTER — Encounter: Payer: Self-pay | Admitting: Internal Medicine

## 2024-04-17 ENCOUNTER — Other Ambulatory Visit: Payer: Self-pay

## 2024-04-17 ENCOUNTER — Other Ambulatory Visit: Payer: Self-pay | Admitting: Internal Medicine

## 2024-04-17 ENCOUNTER — Encounter: Payer: Self-pay | Admitting: Internal Medicine

## 2024-04-17 DIAGNOSIS — E890 Postprocedural hypothyroidism: Secondary | ICD-10-CM

## 2024-04-17 MED ORDER — LEVOTHYROXINE SODIUM 25 MCG PO TABS: 25.0000 ug | ORAL_TABLET | Freq: Every day | ORAL | 1 refills | Status: AC

## 2024-04-17 NOTE — Telephone Encounter (Signed)
 It was discontinued due to [facility] in urine

## 2024-04-17 NOTE — Telephone Encounter (Signed)
 T4 was recently prescribed

## 2024-04-18 NOTE — Telephone Encounter (Signed)
 yes

## 2024-04-25 ENCOUNTER — Other Ambulatory Visit: Payer: Self-pay | Admitting: Internal Medicine

## 2024-04-25 DIAGNOSIS — F332 Major depressive disorder, recurrent severe without psychotic features: Secondary | ICD-10-CM

## 2024-04-26 ENCOUNTER — Ambulatory Visit: Admitting: Internal Medicine

## 2024-05-21 ENCOUNTER — Ambulatory Visit: Admitting: Internal Medicine

## 2024-06-06 ENCOUNTER — Ambulatory Visit: Admitting: Internal Medicine
